# Patient Record
Sex: Female | Born: 1935 | Race: White | Hispanic: No | Smoking: Never smoker
Health system: Southern US, Community
[De-identification: ages and names within clinical notes are randomized; demographics above are authoritative.]

## PROBLEM LIST (undated history)

## (undated) DIAGNOSIS — C50919 Malignant neoplasm of unspecified site of unspecified female breast: Secondary | ICD-10-CM

## (undated) DIAGNOSIS — Z9221 Personal history of antineoplastic chemotherapy: Secondary | ICD-10-CM

## (undated) DIAGNOSIS — K219 Gastro-esophageal reflux disease without esophagitis: Secondary | ICD-10-CM

## (undated) DIAGNOSIS — M1990 Unspecified osteoarthritis, unspecified site: Secondary | ICD-10-CM

## (undated) DIAGNOSIS — E785 Hyperlipidemia, unspecified: Secondary | ICD-10-CM

## (undated) DIAGNOSIS — I10 Essential (primary) hypertension: Secondary | ICD-10-CM

## (undated) DIAGNOSIS — Z9223 Personal history of estrogen therapy: Secondary | ICD-10-CM

## (undated) DIAGNOSIS — U071 COVID-19: Secondary | ICD-10-CM

## (undated) DIAGNOSIS — Z923 Personal history of irradiation: Secondary | ICD-10-CM

## (undated) DIAGNOSIS — B029 Zoster without complications: Secondary | ICD-10-CM

## (undated) DIAGNOSIS — N368 Other specified disorders of urethra: Secondary | ICD-10-CM

## (undated) DIAGNOSIS — C801 Malignant (primary) neoplasm, unspecified: Secondary | ICD-10-CM

## (undated) DIAGNOSIS — M419 Scoliosis, unspecified: Secondary | ICD-10-CM

## (undated) HISTORY — PX: BREAST BIOPSY: SHX20

## (undated) HISTORY — DX: Other specified disorders of urethra: N36.8

## (undated) HISTORY — PX: TONSILECTOMY, ADENOIDECTOMY, BILATERAL MYRINGOTOMY AND TUBES: SHX2538

## (undated) HISTORY — DX: Essential (primary) hypertension: I10

## (undated) HISTORY — DX: Gastro-esophageal reflux disease without esophagitis: K21.9

## (undated) HISTORY — DX: Hyperlipidemia, unspecified: E78.5

## (undated) HISTORY — DX: Malignant neoplasm of unspecified site of unspecified female breast: C50.919

## (undated) HISTORY — DX: Zoster without complications: B02.9

## (undated) HISTORY — DX: Scoliosis, unspecified: M41.9

## (undated) HISTORY — DX: Malignant (primary) neoplasm, unspecified: C80.1

## (undated) HISTORY — DX: Personal history of estrogen therapy: Z92.23

---
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
## 1998-05-15 ENCOUNTER — Other Ambulatory Visit: Admission: RE | Admit: 1998-05-15 | Discharge: 1998-05-15 | Payer: Self-pay | Admitting: Obstetrics and Gynecology

## 1999-08-12 ENCOUNTER — Ambulatory Visit (HOSPITAL_COMMUNITY): Admission: RE | Admit: 1999-08-12 | Discharge: 1999-08-12 | Payer: Self-pay | Admitting: Gastroenterology

## 1999-08-12 ENCOUNTER — Encounter: Payer: Self-pay | Admitting: Gastroenterology

## 2000-09-14 ENCOUNTER — Encounter (INDEPENDENT_AMBULATORY_CARE_PROVIDER_SITE_OTHER): Payer: Self-pay | Admitting: Specialist

## 2000-09-14 ENCOUNTER — Other Ambulatory Visit: Admission: RE | Admit: 2000-09-14 | Discharge: 2000-09-14 | Payer: Self-pay | Admitting: Obstetrics and Gynecology

## 2001-12-12 DIAGNOSIS — C801 Malignant (primary) neoplasm, unspecified: Secondary | ICD-10-CM

## 2001-12-12 HISTORY — DX: Malignant (primary) neoplasm, unspecified: C80.1

## 2001-12-12 HISTORY — PX: BREAST LUMPECTOMY: SHX2

## 2002-06-12 ENCOUNTER — Encounter: Payer: Self-pay | Admitting: Obstetrics and Gynecology

## 2002-06-12 ENCOUNTER — Other Ambulatory Visit: Admission: RE | Admit: 2002-06-12 | Discharge: 2002-06-12 | Payer: Self-pay | Admitting: *Deleted

## 2002-06-12 ENCOUNTER — Encounter: Admission: RE | Admit: 2002-06-12 | Discharge: 2002-06-12 | Payer: Self-pay | Admitting: Obstetrics and Gynecology

## 2002-06-12 ENCOUNTER — Encounter (INDEPENDENT_AMBULATORY_CARE_PROVIDER_SITE_OTHER): Payer: Self-pay | Admitting: *Deleted

## 2002-06-19 ENCOUNTER — Encounter: Payer: Self-pay | Admitting: *Deleted

## 2002-06-19 ENCOUNTER — Encounter: Admission: RE | Admit: 2002-06-19 | Discharge: 2002-06-19 | Payer: Self-pay | Admitting: *Deleted

## 2002-06-21 ENCOUNTER — Encounter: Admission: RE | Admit: 2002-06-21 | Discharge: 2002-06-21 | Payer: Self-pay | Admitting: *Deleted

## 2002-06-21 ENCOUNTER — Ambulatory Visit (HOSPITAL_BASED_OUTPATIENT_CLINIC_OR_DEPARTMENT_OTHER): Admission: RE | Admit: 2002-06-21 | Discharge: 2002-06-21 | Payer: Self-pay | Admitting: *Deleted

## 2002-06-21 ENCOUNTER — Encounter (INDEPENDENT_AMBULATORY_CARE_PROVIDER_SITE_OTHER): Payer: Self-pay | Admitting: Specialist

## 2002-06-21 ENCOUNTER — Encounter: Payer: Self-pay | Admitting: *Deleted

## 2002-07-03 ENCOUNTER — Ambulatory Visit: Admission: RE | Admit: 2002-07-03 | Discharge: 2002-10-01 | Payer: Self-pay | Admitting: Radiation Oncology

## 2002-07-12 HISTORY — PX: BREAST SURGERY: SHX581

## 2002-07-19 ENCOUNTER — Ambulatory Visit (HOSPITAL_BASED_OUTPATIENT_CLINIC_OR_DEPARTMENT_OTHER): Admission: RE | Admit: 2002-07-19 | Discharge: 2002-07-19 | Payer: Self-pay | Admitting: *Deleted

## 2002-07-19 ENCOUNTER — Encounter: Payer: Self-pay | Admitting: *Deleted

## 2002-10-31 ENCOUNTER — Ambulatory Visit: Admission: RE | Admit: 2002-10-31 | Discharge: 2003-01-29 | Payer: Self-pay | Admitting: Radiation Oncology

## 2002-11-19 ENCOUNTER — Encounter: Admission: RE | Admit: 2002-11-19 | Discharge: 2002-11-19 | Payer: Self-pay | Admitting: Radiation Oncology

## 2002-12-25 ENCOUNTER — Ambulatory Visit (HOSPITAL_BASED_OUTPATIENT_CLINIC_OR_DEPARTMENT_OTHER): Admission: RE | Admit: 2002-12-25 | Discharge: 2002-12-25 | Payer: Self-pay | Admitting: *Deleted

## 2003-08-06 ENCOUNTER — Encounter: Admission: RE | Admit: 2003-08-06 | Discharge: 2003-08-06 | Payer: Self-pay | Admitting: *Deleted

## 2003-08-06 ENCOUNTER — Encounter: Payer: Self-pay | Admitting: *Deleted

## 2003-08-25 ENCOUNTER — Ambulatory Visit (HOSPITAL_COMMUNITY): Admission: RE | Admit: 2003-08-25 | Discharge: 2003-08-25 | Payer: Self-pay | Admitting: Oncology

## 2003-09-25 ENCOUNTER — Ambulatory Visit: Admission: RE | Admit: 2003-09-25 | Discharge: 2003-09-25 | Payer: Self-pay | Admitting: Radiation Oncology

## 2004-03-18 ENCOUNTER — Ambulatory Visit (HOSPITAL_COMMUNITY): Admission: RE | Admit: 2004-03-18 | Discharge: 2004-03-18 | Payer: Self-pay | Admitting: Oncology

## 2004-05-10 ENCOUNTER — Encounter (HOSPITAL_COMMUNITY): Admission: RE | Admit: 2004-05-10 | Discharge: 2004-05-10 | Payer: Self-pay | Admitting: Oncology

## 2004-08-11 ENCOUNTER — Encounter: Admission: RE | Admit: 2004-08-11 | Discharge: 2004-08-11 | Payer: Self-pay | Admitting: Oncology

## 2004-09-13 ENCOUNTER — Ambulatory Visit (HOSPITAL_COMMUNITY): Admission: RE | Admit: 2004-09-13 | Discharge: 2004-09-13 | Payer: Self-pay | Admitting: Gastroenterology

## 2004-11-11 ENCOUNTER — Ambulatory Visit: Payer: Self-pay | Admitting: Oncology

## 2005-02-04 ENCOUNTER — Encounter: Payer: Self-pay | Admitting: Cardiology

## 2005-05-12 ENCOUNTER — Ambulatory Visit: Payer: Self-pay | Admitting: Oncology

## 2005-08-17 ENCOUNTER — Encounter: Admission: RE | Admit: 2005-08-17 | Discharge: 2005-08-17 | Payer: Self-pay | Admitting: Obstetrics and Gynecology

## 2005-11-09 ENCOUNTER — Ambulatory Visit: Payer: Self-pay | Admitting: Oncology

## 2006-05-26 ENCOUNTER — Ambulatory Visit: Payer: Self-pay | Admitting: Oncology

## 2006-05-29 LAB — CBC WITH DIFFERENTIAL (CANCER CENTER ONLY)
BASO%: 1.1 % (ref 0.0–2.0)
EOS%: 2.8 % (ref 0.0–7.0)
LYMPH%: 26.6 % (ref 14.0–48.0)
MCH: 29.1 pg (ref 26.0–34.0)
MCHC: 32.6 g/dL (ref 32.0–36.0)
MCV: 89 fL (ref 81–101)
MONO#: 0.2 10*3/uL (ref 0.1–0.9)
MONO%: 4.1 % (ref 0.0–13.0)
Platelets: 160 10*3/uL (ref 145–400)
RDW: 12.3 % (ref 10.5–14.6)
WBC: 5.1 10*3/uL (ref 3.9–10.0)

## 2006-05-29 LAB — CANCER ANTIGEN 27.29: CA 27.29: 25 U/mL (ref 0–39)

## 2006-05-29 LAB — COMPREHENSIVE METABOLIC PANEL
Albumin: 4.7 g/dL (ref 3.5–5.2)
Alkaline Phosphatase: 90 U/L (ref 39–117)
BUN: 17 mg/dL (ref 6–23)
Glucose, Bld: 115 mg/dL — ABNORMAL HIGH (ref 70–99)
Total Bilirubin: 0.6 mg/dL (ref 0.3–1.2)

## 2006-08-18 ENCOUNTER — Encounter: Admission: RE | Admit: 2006-08-18 | Discharge: 2006-08-18 | Payer: Self-pay | Admitting: Oncology

## 2006-11-22 ENCOUNTER — Ambulatory Visit: Payer: Self-pay | Admitting: Oncology

## 2006-11-27 LAB — COMPREHENSIVE METABOLIC PANEL
ALT: 19 U/L (ref 0–35)
AST: 21 U/L (ref 0–37)
Albumin: 4.6 g/dL (ref 3.5–5.2)
Calcium: 10.2 mg/dL (ref 8.4–10.5)
Chloride: 104 mEq/L (ref 96–112)
Potassium: 4.1 mEq/L (ref 3.5–5.3)
Sodium: 138 mEq/L (ref 135–145)

## 2006-11-27 LAB — CBC WITH DIFFERENTIAL (CANCER CENTER ONLY)
Eosinophils Absolute: 0.1 10*3/uL (ref 0.0–0.5)
LYMPH%: 30.8 % (ref 14.0–48.0)
MCH: 30.4 pg (ref 26.0–34.0)
MCV: 90 fL (ref 81–101)
MONO%: 6 % (ref 0.0–13.0)
Platelets: 149 10*3/uL (ref 145–400)
RBC: 4.98 10*6/uL (ref 3.70–5.32)

## 2007-01-17 ENCOUNTER — Encounter: Admission: RE | Admit: 2007-01-17 | Discharge: 2007-01-17 | Payer: Self-pay | Admitting: Internal Medicine

## 2007-05-25 ENCOUNTER — Ambulatory Visit: Payer: Self-pay | Admitting: Oncology

## 2007-05-28 LAB — CBC WITH DIFFERENTIAL (CANCER CENTER ONLY)
BASO%: 0.5 % (ref 0.0–2.0)
EOS%: 3.1 % (ref 0.0–7.0)
LYMPH#: 1.4 10*3/uL (ref 0.9–3.3)
MONO#: 0.3 10*3/uL (ref 0.1–0.9)
NEUT#: 3 10*3/uL (ref 1.5–6.5)
Platelets: 207 10*3/uL (ref 145–400)
RDW: 12.3 % (ref 10.5–14.6)
WBC: 4.9 10*3/uL (ref 3.9–10.0)

## 2007-05-28 LAB — COMPREHENSIVE METABOLIC PANEL
ALT: 16 U/L (ref 0–35)
AST: 24 U/L (ref 0–37)
CO2: 23 mEq/L (ref 19–32)
Chloride: 104 mEq/L (ref 96–112)
Sodium: 138 mEq/L (ref 135–145)
Total Bilirubin: 0.5 mg/dL (ref 0.3–1.2)
Total Protein: 6.9 g/dL (ref 6.0–8.3)

## 2007-05-28 LAB — CANCER ANTIGEN 27.29: CA 27.29: 15 U/mL (ref 0–39)

## 2007-08-21 ENCOUNTER — Encounter: Admission: RE | Admit: 2007-08-21 | Discharge: 2007-08-21 | Payer: Self-pay | Admitting: Surgery

## 2007-09-03 ENCOUNTER — Encounter: Admission: RE | Admit: 2007-09-03 | Discharge: 2007-09-03 | Payer: Self-pay | Admitting: Internal Medicine

## 2007-11-20 ENCOUNTER — Ambulatory Visit: Payer: Self-pay | Admitting: Oncology

## 2007-11-21 LAB — CBC WITH DIFFERENTIAL (CANCER CENTER ONLY)
BASO#: 0 10*3/uL (ref 0.0–0.2)
BASO%: 0.4 % (ref 0.0–2.0)
HCT: 44.2 % (ref 34.8–46.6)
HGB: 14.9 g/dL (ref 11.6–15.9)
LYMPH#: 0.8 10*3/uL — ABNORMAL LOW (ref 0.9–3.3)
MONO#: 0.3 10*3/uL (ref 0.1–0.9)
NEUT#: 3.7 10*3/uL (ref 1.5–6.5)
NEUT%: 75.7 % (ref 39.6–80.0)
WBC: 4.9 10*3/uL (ref 3.9–10.0)

## 2007-11-21 LAB — COMPREHENSIVE METABOLIC PANEL
ALT: 13 U/L (ref 0–35)
BUN: 13 mg/dL (ref 6–23)
CO2: 26 mEq/L (ref 19–32)
Creatinine, Ser: 0.81 mg/dL (ref 0.40–1.20)
Total Bilirubin: 0.6 mg/dL (ref 0.3–1.2)

## 2007-11-21 LAB — CANCER ANTIGEN 27.29: CA 27.29: 20 U/mL (ref 0–39)

## 2007-11-23 ENCOUNTER — Emergency Department (HOSPITAL_COMMUNITY): Admission: EM | Admit: 2007-11-23 | Discharge: 2007-11-23 | Payer: Self-pay | Admitting: Family Medicine

## 2008-05-19 ENCOUNTER — Ambulatory Visit: Payer: Self-pay | Admitting: Oncology

## 2008-05-21 LAB — CBC WITH DIFFERENTIAL (CANCER CENTER ONLY)
BASO#: 0 10*3/uL (ref 0.0–0.2)
Eosinophils Absolute: 0.2 10*3/uL (ref 0.0–0.5)
HCT: 43.4 % (ref 34.8–46.6)
HGB: 15 g/dL (ref 11.6–15.9)
LYMPH%: 23.3 % (ref 14.0–48.0)
MCH: 29.9 pg (ref 26.0–34.0)
MCV: 87 fL (ref 81–101)
MONO%: 4.9 % (ref 0.0–13.0)
RBC: 5.01 10*6/uL (ref 3.70–5.32)

## 2008-05-21 LAB — COMPREHENSIVE METABOLIC PANEL
ALT: 15 U/L (ref 0–35)
AST: 19 U/L (ref 0–37)
Albumin: 4.6 g/dL (ref 3.5–5.2)
Alkaline Phosphatase: 68 U/L (ref 39–117)
Potassium: 3.9 mEq/L (ref 3.5–5.3)
Sodium: 141 mEq/L (ref 135–145)
Total Protein: 7.2 g/dL (ref 6.0–8.3)

## 2008-05-21 LAB — CANCER ANTIGEN 27.29: CA 27.29: 22 U/mL (ref 0–39)

## 2008-08-21 ENCOUNTER — Encounter: Admission: RE | Admit: 2008-08-21 | Discharge: 2008-08-21 | Payer: Self-pay | Admitting: Oncology

## 2008-11-13 ENCOUNTER — Ambulatory Visit: Payer: Self-pay | Admitting: Oncology

## 2008-11-17 LAB — CMP (CANCER CENTER ONLY)
Albumin: 4 g/dL (ref 3.3–5.5)
BUN, Bld: 15 mg/dL (ref 7–22)
Calcium: 9.7 mg/dL (ref 8.0–10.3)
Chloride: 105 mEq/L (ref 98–108)
Creat: 0.9 mg/dl (ref 0.6–1.2)
Glucose, Bld: 90 mg/dL (ref 73–118)
Potassium: 4.4 mEq/L (ref 3.3–4.7)

## 2008-11-17 LAB — CBC WITH DIFFERENTIAL (CANCER CENTER ONLY)
BASO#: 0.1 10*3/uL (ref 0.0–0.2)
Eosinophils Absolute: 0.2 10*3/uL (ref 0.0–0.5)
HGB: 14.8 g/dL (ref 11.6–15.9)
LYMPH#: 1.3 10*3/uL (ref 0.9–3.3)
NEUT#: 3.3 10*3/uL (ref 1.5–6.5)
RBC: 5.13 10*6/uL (ref 3.70–5.32)

## 2008-11-17 LAB — CANCER ANTIGEN 27.29: CA 27.29: 24 U/mL (ref 0–39)

## 2009-01-22 ENCOUNTER — Ambulatory Visit: Payer: Self-pay | Admitting: Oncology

## 2009-01-26 LAB — CMP (CANCER CENTER ONLY)
ALT(SGPT): 20 U/L (ref 10–47)
AST: 27 U/L (ref 11–38)
Alkaline Phosphatase: 56 U/L (ref 26–84)
BUN, Bld: 13 mg/dL (ref 7–22)
Creat: 1.1 mg/dl (ref 0.6–1.2)
Potassium: 4.2 mEq/L (ref 3.3–4.7)

## 2009-01-26 LAB — CBC WITH DIFFERENTIAL (CANCER CENTER ONLY)
BASO#: 0.1 10*3/uL (ref 0.0–0.2)
BASO%: 1.1 % (ref 0.0–2.0)
EOS%: 2.9 % (ref 0.0–7.0)
HGB: 15.1 g/dL (ref 11.6–15.9)
LYMPH#: 1.4 10*3/uL (ref 0.9–3.3)
MCHC: 33.9 g/dL (ref 32.0–36.0)
MONO#: 0.3 10*3/uL (ref 0.1–0.9)
NEUT#: 2.6 10*3/uL (ref 1.5–6.5)
RDW: 11.7 % (ref 10.5–14.6)
WBC: 4.5 10*3/uL (ref 3.9–10.0)

## 2009-08-24 ENCOUNTER — Encounter: Admission: RE | Admit: 2009-08-24 | Discharge: 2009-08-24 | Payer: Self-pay | Admitting: Obstetrics and Gynecology

## 2010-01-29 ENCOUNTER — Ambulatory Visit: Payer: Self-pay | Admitting: Oncology

## 2010-02-02 LAB — CMP (CANCER CENTER ONLY)
Albumin: 3.9 g/dL (ref 3.3–5.5)
BUN, Bld: 12 mg/dL (ref 7–22)
CO2: 31 mEq/L (ref 18–33)
Calcium: 10 mg/dL (ref 8.0–10.3)
Chloride: 99 mEq/L (ref 98–108)
Creat: 0.7 mg/dl (ref 0.6–1.2)
Glucose, Bld: 94 mg/dL (ref 73–118)
Potassium: 4 mEq/L (ref 3.3–4.7)

## 2010-02-02 LAB — CBC WITH DIFFERENTIAL (CANCER CENTER ONLY)
BASO#: 0 10*3/uL (ref 0.0–0.2)
EOS%: 1.8 % (ref 0.0–7.0)
Eosinophils Absolute: 0.1 10*3/uL (ref 0.0–0.5)
HCT: 44.3 % (ref 34.8–46.6)
HGB: 14.8 g/dL (ref 11.6–15.9)
LYMPH#: 1.2 10*3/uL (ref 0.9–3.3)
MCHC: 33.3 g/dL (ref 32.0–36.0)
NEUT#: 3.4 10*3/uL (ref 1.5–6.5)
NEUT%: 67 % (ref 39.6–80.0)
RBC: 4.83 10*6/uL (ref 3.70–5.32)

## 2010-02-02 LAB — CANCER ANTIGEN 27.29: CA 27.29: 22 U/mL (ref 0–39)

## 2010-02-17 ENCOUNTER — Encounter (HOSPITAL_COMMUNITY): Admission: RE | Admit: 2010-02-17 | Discharge: 2010-05-18 | Payer: Self-pay | Admitting: Oncology

## 2010-08-25 ENCOUNTER — Encounter: Admission: RE | Admit: 2010-08-25 | Discharge: 2010-08-25 | Payer: Self-pay | Admitting: Obstetrics and Gynecology

## 2010-10-05 ENCOUNTER — Encounter: Admission: RE | Admit: 2010-10-05 | Discharge: 2010-10-05 | Payer: Self-pay | Admitting: Internal Medicine

## 2010-10-20 ENCOUNTER — Encounter: Admission: RE | Admit: 2010-10-20 | Discharge: 2010-10-20 | Payer: Self-pay | Admitting: Internal Medicine

## 2011-01-02 ENCOUNTER — Encounter: Payer: Self-pay | Admitting: Internal Medicine

## 2011-02-02 ENCOUNTER — Other Ambulatory Visit: Payer: Self-pay | Admitting: Oncology

## 2011-02-02 ENCOUNTER — Encounter (HOSPITAL_BASED_OUTPATIENT_CLINIC_OR_DEPARTMENT_OTHER): Payer: Medicare Other | Admitting: Oncology

## 2011-02-02 DIAGNOSIS — Z170 Estrogen receptor positive status [ER+]: Secondary | ICD-10-CM

## 2011-02-02 DIAGNOSIS — R079 Chest pain, unspecified: Secondary | ICD-10-CM

## 2011-02-02 DIAGNOSIS — C50419 Malignant neoplasm of upper-outer quadrant of unspecified female breast: Secondary | ICD-10-CM

## 2011-02-02 LAB — COMPREHENSIVE METABOLIC PANEL WITH GFR
ALT: 12 U/L (ref 0–35)
AST: 18 U/L (ref 0–37)
Albumin: 4.5 g/dL (ref 3.5–5.2)
Alkaline Phosphatase: 48 U/L (ref 39–117)
BUN: 15 mg/dL (ref 6–23)
CO2: 23 meq/L (ref 19–32)
Calcium: 10.1 mg/dL (ref 8.4–10.5)
Chloride: 104 meq/L (ref 96–112)
Creatinine, Ser: 0.69 mg/dL (ref 0.40–1.20)
Glucose, Bld: 92 mg/dL (ref 70–99)
Potassium: 3.8 meq/L (ref 3.5–5.3)
Sodium: 139 meq/L (ref 135–145)
Total Bilirubin: 0.6 mg/dL (ref 0.3–1.2)
Total Protein: 6.6 g/dL (ref 6.0–8.3)

## 2011-02-02 LAB — CBC WITH DIFFERENTIAL/PLATELET
Basophils Absolute: 0 10*3/uL (ref 0.0–0.1)
Eosinophils Absolute: 0.1 10*3/uL (ref 0.0–0.5)
HGB: 14 g/dL (ref 11.6–15.9)
MCV: 90.6 fL (ref 79.5–101.0)
MONO%: 9.2 % (ref 0.0–14.0)
NEUT#: 3.7 10*3/uL (ref 1.5–6.5)
RBC: 4.62 10*6/uL (ref 3.70–5.45)
RDW: 13.3 % (ref 11.2–14.5)
WBC: 6 10*3/uL (ref 3.9–10.3)
lymph#: 1.6 10*3/uL (ref 0.9–3.3)

## 2011-03-28 ENCOUNTER — Other Ambulatory Visit: Payer: Self-pay | Admitting: Internal Medicine

## 2011-03-28 DIAGNOSIS — M549 Dorsalgia, unspecified: Secondary | ICD-10-CM

## 2011-03-29 ENCOUNTER — Ambulatory Visit
Admission: RE | Admit: 2011-03-29 | Discharge: 2011-03-29 | Disposition: A | Discharge status: Home / Self Care | Payer: Medicare Other | Source: Ambulatory Visit | Attending: Internal Medicine | Admitting: Internal Medicine

## 2011-03-29 DIAGNOSIS — M549 Dorsalgia, unspecified: Secondary | ICD-10-CM

## 2011-04-29 NOTE — Op Note (Signed)
Marie Alvarez, Marie Alvarez                   ACCOUNT NO.:  [id number]   MEDICAL RECORD NO.:  [id number]          PATIENT TYPE:  AMB   LOCATION:  ENDO                         FACILITY:  [facility]   PHYSICIAN:  Bernette Redbird, M.D.   DATE OF BIRTH:  1931-03-27   DATE OF PROCEDURE:  09/13/2004  DATE OF DISCHARGE:                                 OPERATIVE REPORT   PROCEDURE:  Colonoscopy.   INDICATION:  Colon cancer screening in a patient without worrisome risk  factors or symptoms, age 77, with a prior colon exam (flexible sigmoidoscopy  versus colonoscopy, details not clear), apparently showing a poor prep.   FINDINGS:  Extensive diverticulosis with sigmoid fixation.  Otherwise normal  to the terminal ileum.   DESCRIPTION OF PROCEDURE:  The nature, purpose, and risks of the procedure  had been discussed with the patient who provided written consent.  Sedation  was fentanyl 100 mcg and Versed 10 mg IV without arrhythmias or  desaturation.   The Olympus adjustable-tension pediatric videocolonoscope was advanced with  moderate difficulty through a sigmoid region that had very large amount of  diverticular change with associated muscular thickening, angulation, and  stiffening of the colon.  By having the patient in the supine position and  using tip deflection and some slide-by maneuvering, I was able to eventually  get through this area, whereupon the scope advanced fairly easily with the  help of some external abdominal compression to the cecum and from there into  a normal-appearing terminal ileum, whereupon pullback was performed.  The  quality of the prep was quite good, and it was felt that essentially all  areas were adequately seen.  Other than the fairly severe left-side sigmoid  diverticular change, this was a normal examination.  No polyps, cancer,  colitis, or vascular malformations were observed.  Retroflexion in the  rectum was performed and on inspection was normal.  No biopsies  were  obtained.  The patient tolerated the procedure well, and there were no  apparent complications.   IMPRESSION:  Severe sigmoid diverticular change, otherwise unremarkable  screening examination in a standard-risk individual (V76.51).   PLAN:  Consider flexible sigmoidoscopy in five years for continued  screening, recognizing that it would probably be a rather limited exam in  view of the patient's anatomy.       RB/MEDQ  D:  09/13/2004  T:  09/13/2004  Job:  [id number]   cc:   Loraine Leriche A. Waynard Edwards, M.D.  [address]  [place]  [place] [address]  Fax: [phone]   [name], MD  Fax: [phone]

## 2011-04-29 NOTE — Op Note (Signed)
[place]  Patient:    Marie Alvarez, Marie Alvarez Visit Number: [id number] MRN: [id number]          Service Type: DSU Location: [facility] Attending Physician:  Kandis Mannan Dictated by:   Donnie Coffin Samuella Cota, M.D. Proc. Date: 06/21/02 Admit Date:  06/21/2002 Discharge Date: 06/21/2002   CC:         Katherine Roan, M.D.  Lonzo Cloud. Kriste Basque, M.D. [facility]   Operative Report  PREOPERATIVE DIAGNOSIS: 1. Lobular carcinoma of left breast. 2. Skin lesion, right leg.  POSTOPERATIVE DIAGNOSIS: 1. Lobular carcinoma of the left breast. 2. Skin lesion, right leg.  OPERATION PERFORMED: 1. Injection of blue dye left breast, sentinel lymph node dissection, left    axilla.  Needle localization and a partial mastectomy of the left breast. 2. Incisional biopsy, right leg.  SURGEON:  Maisie Fus B. Samuella Cota, M.D.  ANESTHESIA:  General, anesthesiologist and CRNA [place].  DESCRIPTION OF PROCEDURE:  The patient was taken to the operating room and placed on the table in supine position and after satisfactory general anesthetic with LMA intubation, the left breast and axilla were prepped and draped as a sterile field.  The patient had previously been injected with about 4 cc of isosulfan blue dye in the subareolar area.  The area was massaged for about three minutes.  After sterile drapes had been applied, the Neoprobe was placed over the left axilla and a hot spot was marked.  A transverse incision was made in the lower axilla through skin and subcutaneous tissues.  A blue lymphatic was readily identified going to a small lymph node which was also blue and hot.  This was dissected free with bleeding being controlled with the cautery.  The lymph node was then placed on the Neoprobe and it was quite hot.  The probe was placed back into axilla.  There were no other hot nodes noted.  This wound was then packed open.  The specimen was sent to the pathologist.  The patient had two wires  coming through the skin at about the 5 oclock position aimed cephalad.  An elliptical incision was made so as to remove the opening in the skin made by the wire and this was actually at the lower inferior portion and the curved incision extended on up into the left upper quadrant.  The incision was probably 6 to 7 cm in length.  The tumor was thought to be quite large, so a large amount of breast tissue was taken.  Skin flaps were developed medially and laterally, superiorly and inferiorly and then the breast was taken down to the chest wall and removed from the chest wall.  The specimen was then marked with a short suture at the inferior medial skin margin, a long suture at the superior and lateral margin and a short long suture marked the depth of the specimen.  This was sent to the pathologist fresh.  It was first sent to the radiologist who x-rayed the specimen, so the area of concern had been removed.  Bleeding was controlled with the cautery or with ties of 3-0 Vicryl.  The pathologist, Dr. Delila Spence reported the sentinel lymph node was negative on imprint.  No evidence of malignancy in the node.  Both wounds were copiously irrigated and closed with subcuticular sutures of 3-0 Vicryl followed by a running 4-0 subcuticular Vicryl.  Benzoin and half inch Steri-Strips were used to reinforce the skin closure.  Pressure dressing using 4 x 4s, ABD  and four inch HypaFix was applied.  Attention was then directed to the right leg where laterally, she had about a 1.5 cm lesion which is not healed.  A small ellipse of the skin and underlying subcutaneous tissue was excised for biopsy purposes.  The wound was closed with interrupted sutures of 4-0 nylon.  Dry sterile dressing was applied.  The pathologist also reported that the margins on the resected left breast tissue was negative for tumor.  The patient seemed to tolerate all the procedures well and was taken to the PACU in satisfactory  condition. Dictated by:   Donnie Coffin Samuella Cota, M.D. Attending Physician:  Kandis Mannan DD:  06/21/02 TD:  06/24/02 Job: [id number]

## 2011-04-29 NOTE — Op Note (Signed)
Marie Alvarez, Marie Alvarez                               ACCOUNT NO.:  [id number]   MEDICAL RECORD NO.:  [id number]                   PATIENT TYPE:   LOCATION:                                       FACILITY:   PHYSICIAN:  Maisie Fus B. Samuella Cota, M.D.               DATE OF BIRTH:   DATE OF PROCEDURE:  07/19/2002  DATE OF DISCHARGE:                                 OPERATIVE REPORT   PREOPERATIVE DIAGNOSIS:  Carcinoma of the left breast, needs Port-A-Cath for  intravenous chemotherapy.   POSTOPERATIVE DIAGNOSIS:  Carcinoma of the left breast, needs Port-A-Cath  for intravenous chemotherapy.   OPERATION:  Insertion of Port-A-Cath (Bard ISP) catheter via right  subclavian vein.   SURGEON:  Maisie Fus B. Samuella Cota, M.D.   ASSISTANT:  Kaylyn Layer. Michelle Piper, M.D.   ANESTHESIA:  Local 1% Xylocaine.   ANESTHESIOLOGIST:  Kaylyn Layer. Michelle Piper, M.D. and C.R.N.A.   PROCEDURE:  The patient was taken to the operating room and placed on the  table in the supine position.  A rolled up sheet was placed vertically along  the upper thoracic spines.  The neck and chest area were prepped  bilaterally.  The patient was placed in the Trendelenburg position.  Sterile  drapes were applied.  Xylocaine, 1%, local was used to infiltrate the skin  beneath the right clavicle.  With some difficulty, the right subclavian vein  was entered but the guide wire would not pass.  Twice I entered the vein but  the guide wire would not pass.  I asked Dr. Arta Bruce, our  anesthesiologist, to assist me and he was able to get into the right  subclavian vein and we were able to pass the guide wire caudally.  Xylocaine, 1%, local was used to infiltrate the skin overlying the right  chest where a small transverse incision was made and a subcutaneous pocket  made inferiorly to that.  The Bard ISP catheter was then tunneled from the  area of the subcutaneous pocket through the area of the subclavian stick.  The catheter was filled with heparinized saline.   The dilator was placed  over the guide wire and then the dilator and peel-away sheath was placed  over the guide wire and the guide wire and dilator were removed leaving the  peal-away sheath.  The Bard catheter was then placed through the peel-away  sheath and passed caudally.  The peel-away sheath was pulled away.  The tip  of the catheter, by fluoroscopy was brought up into the area of the superior  vena cava.  The catheter was then connected to the reservoir which had  previously been filled with heparinized saline.  This was done using the  round attaching device.  The reservoir was sutured into the subcutaneous  pocket using two 3-0 Vicryl sutures.  Fluoroscopy was used to check the  position and everything seemed in good  position.  I then was able to  aspirate through the reservoir and then fill the entire system with 4 cc of  heparin, 100 units/cc.  Subcutaneous 3-0 Vicryl sutures were placed and then  the skin was closed with a running subcuticular suture of 4-0 Vicryl with a  single 4-0  Vicryl subcuticular suture at the subclavian stick.  Benzoin and half-inch  Steri-Strips were used to reinforce the skin closure.  A dry sterile  dressing was applied.  The patient seemed to tolerated the procedure well  and was taken to the PACU in satisfactory condition.                                               Thomas B. Samuella Cota, M.D.    TBP/MEDQ  D:  07/19/2002  T:  07/23/2002  Job:  [id number]   cc:   Donetta Potts, M.D.   Faythe Casa, MD  [address] [place] [address]   Artist Pais Kathrynn Running, M.D.   Lonzo Cloud. Kriste Basque, M.D. [facility]   Maisie Fus B. Samuella Cota, M.D.  Fax: [phone]

## 2011-07-20 ENCOUNTER — Other Ambulatory Visit: Payer: Self-pay | Admitting: Internal Medicine

## 2011-07-20 DIAGNOSIS — Z1231 Encounter for screening mammogram for malignant neoplasm of breast: Secondary | ICD-10-CM

## 2011-08-29 ENCOUNTER — Ambulatory Visit
Admission: RE | Admit: 2011-08-29 | Discharge: 2011-08-29 | Disposition: A | Discharge status: Home / Self Care | Payer: Medicare Other | Source: Ambulatory Visit | Attending: Internal Medicine | Admitting: Internal Medicine

## 2011-08-29 DIAGNOSIS — Z1231 Encounter for screening mammogram for malignant neoplasm of breast: Secondary | ICD-10-CM

## 2011-10-10 ENCOUNTER — Ambulatory Visit: Payer: Medicare Other | Admitting: Gynecology

## 2011-12-26 ENCOUNTER — Ambulatory Visit (INDEPENDENT_AMBULATORY_CARE_PROVIDER_SITE_OTHER): Payer: Medicare Other | Admitting: Gynecology

## 2011-12-26 ENCOUNTER — Encounter: Payer: Self-pay | Admitting: Gynecology

## 2011-12-26 VITALS — BP 140/80 | Ht 63.0 in | Wt 146.0 lb

## 2011-12-26 DIAGNOSIS — M810 Age-related osteoporosis without current pathological fracture: Secondary | ICD-10-CM | POA: Diagnosis not present

## 2011-12-26 DIAGNOSIS — N952 Postmenopausal atrophic vaginitis: Secondary | ICD-10-CM

## 2011-12-26 NOTE — Progress Notes (Signed)
Marie Alvarez 01/12/1931 [id number]        78 y.o. new patient for follow up. Normally saw Dr. Leota Alvarez.  Past medical history,surgical history, medications, allergies, family history and social history were all reviewed and documented in the EPIC chart. ROS:  Was performed and pertinent positives and negatives are included in the history.  Exam: Marie Alvarez chaperone present Filed Vitals:   12/26/11 1002  BP: 140/80   General appearance  Normal Skin grossly normal Head/Neck normal with no cervical or supraclavicular adenopathy thyroid normal Lungs  clear Cardiac RR, without RMG Abdominal  soft, nontender, without masses, organomegaly or hernia Breasts  examined lying and sitting. Right without masses, retractions, discharge or axillary adenopathy.  Left with scarring from her lumpectomy.  No acute changes, masses, discharge or adenopathy Pelvic  Ext/BUS/vagina  Atrophic genital changes  Cervix  atrophic  Uterus  small, normal shape and contour, midline and mobile nontender   Adnexa  Without masses or tenderness    Anus and perineum  normal   Rectovaginal  normal sphincter tone without palpated masses or tenderness.    Assessment/Plan:  78 y.o. female for annual exam.    1. Atrophic changes. She has not sexually active and this is not an issue. 2. Breast cancer. Patient has history of lumpectomy with radiation and chemotherapy 2003.  Remains with no evidence of disease. She'll continue to follow with her oncologist that she sees annually by history.  She is up-to-date with mammography. SBE monthly reviewed. 3. Bone health. Patient is on alendronate historically through Dr. Laurey Alvarez office. I do not have a copy of her bone density and have asked her make sure she follows up with him in reference to this in management of her bones. Increase calcium vitamin D discussed. 4. Pap smear. She has no history of abnormal Pap smears before. She has numerous normal reports in her chart, the last in  2011. No Pap smear was done today. I discussed current screening guidelines the options to stop altogether if she is over 56 was discussed and we'll readdress this on an annual basis. 5. Colonoscopy. Patients at 10 years from her last colonoscopy and received a letter recommending she scheduled this. Dr. Waynard Alvarez told her that she did not need to do this. I've asked her to rediscuss this with Dr. Waynard Alvarez. She is active and otherwise in general good health and I also suggested that she discuss this with the gastroenterologist who did her last colonoscopy. 6. Health maintenance. No blood work was done today this is all done through Dr. Laurey Alvarez office who she sees routinely.    Marie Lords MD, 10:57 AM 12/26/2011

## 2011-12-26 NOTE — Patient Instructions (Signed)
Discuss colonoscopy with Dr. Waynard Edwards and the gastroenterologist.  Discuss bone health and need to be on medication with with Dr. Waynard Edwards.  Follow up with me in one year

## 2012-02-02 ENCOUNTER — Ambulatory Visit: Payer: Medicare Other | Admitting: Lab

## 2012-02-02 ENCOUNTER — Telehealth: Payer: Self-pay | Admitting: Oncology

## 2012-02-02 ENCOUNTER — Encounter: Payer: Self-pay | Admitting: Oncology

## 2012-02-02 ENCOUNTER — Ambulatory Visit (HOSPITAL_BASED_OUTPATIENT_CLINIC_OR_DEPARTMENT_OTHER): Payer: Medicare Other | Admitting: Oncology

## 2012-02-02 VITALS — BP 161/83 | HR 74 | Temp 98.7°F

## 2012-02-02 DIAGNOSIS — Z9223 Personal history of estrogen therapy: Secondary | ICD-10-CM | POA: Diagnosis not present

## 2012-02-02 DIAGNOSIS — R079 Chest pain, unspecified: Secondary | ICD-10-CM | POA: Diagnosis not present

## 2012-02-02 DIAGNOSIS — Z170 Estrogen receptor positive status [ER+]: Secondary | ICD-10-CM | POA: Insufficient documentation

## 2012-02-02 DIAGNOSIS — E559 Vitamin D deficiency, unspecified: Secondary | ICD-10-CM

## 2012-02-02 DIAGNOSIS — Z853 Personal history of malignant neoplasm of breast: Secondary | ICD-10-CM | POA: Insufficient documentation

## 2012-02-02 DIAGNOSIS — C50419 Malignant neoplasm of upper-outer quadrant of unspecified female breast: Secondary | ICD-10-CM

## 2012-02-02 DIAGNOSIS — C50412 Malignant neoplasm of upper-outer quadrant of left female breast: Secondary | ICD-10-CM | POA: Insufficient documentation

## 2012-02-02 HISTORY — DX: Personal history of estrogen therapy: Z92.23

## 2012-02-02 LAB — COMPREHENSIVE METABOLIC PANEL
Albumin: 4.4 g/dL (ref 3.5–5.2)
BUN: 15 mg/dL (ref 6–23)
CO2: 26 mEq/L (ref 19–32)
Glucose, Bld: 94 mg/dL (ref 70–99)
Potassium: 4.4 mEq/L (ref 3.5–5.3)
Sodium: 139 mEq/L (ref 135–145)
Total Bilirubin: 0.4 mg/dL (ref 0.3–1.2)
Total Protein: 6.7 g/dL (ref 6.0–8.3)

## 2012-02-02 LAB — CBC WITH DIFFERENTIAL/PLATELET
Basophils Absolute: 0 10*3/uL (ref 0.0–0.1)
Eosinophils Absolute: 0.1 10*3/uL (ref 0.0–0.5)
HCT: 42.2 % (ref 34.8–46.6)
HGB: 14.3 g/dL (ref 11.6–15.9)
LYMPH%: 27.2 % (ref 14.0–49.7)
MCV: 89.7 fL (ref 79.5–101.0)
MONO#: 0.3 10*3/uL (ref 0.1–0.9)
MONO%: 6.8 % (ref 0.0–14.0)
NEUT#: 3.2 10*3/uL (ref 1.5–6.5)
Platelets: 149 10*3/uL (ref 145–400)
RBC: 4.71 10*6/uL (ref 3.70–5.45)
WBC: 5.1 10*3/uL (ref 3.9–10.3)

## 2012-02-02 NOTE — Progress Notes (Signed)
OFFICE PROGRESS NOTE  CC  Dr. 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  PRIOR THERAPY:  #1 patient underwent a lumpectomy in July 2003 for a 3.4 cm invasive ductal carcinoma that was ER positive PR positive HER-2/neu negative.  #2 she then received 6 cycles of adjuvant chemotherapy consisting of CMF followed by radiation therapy.  #3 she then received one year of tamoxifen that was discontinued on December 2004.  #4 she was then started on Arimidex 1 mg daily between 12/17/2003 to January 2010  CURRENT THERAPY:Observation  INTERVAL HISTORY: Marie Alvarez 78 y.o. female returns for Yearly followup visit. Overall she is doing well she still continues to have significant amount of trouble with her back. She does have some degenerative disease. She otherwise denies any fevers chills night sweats headaches shortness of breath chest pains palpitations. She has not had any breast masses that she has noticed. Today she does get tenderness in the left breast. Remainder of the 10 point review of systems is negative.  MEDICAL HISTORY: Past Medical History  Diagnosis Date  . Cancer 2003    BREAST-LEFT  . Hypertension   . Macular degeneration   . Osteoporosis   . Hx estrogen therapy 02/02/2012  . History of breast cancer in female 02/02/2012    ALLERGIES:  is allergic to sulfa antibiotics and prednisone.  MEDICATIONS:  Current Outpatient Prescriptions  Medication Sig Dispense Refill  . amLODipine (NORVASC) 5 MG tablet Take 5 mg by mouth daily.      Marland Kitchen aspirin 81 MG tablet Take 160 mg by mouth daily.      . Cholecalciferol (VITAMIN D-3 PO) Take by mouth.      . fish oil-omega-3 fatty acids 1000 MG capsule Take 2 g by mouth daily.      Marland Kitchen LISINOPRIL PO Take by mouth.      . NORTRIPTYLINE HCL PO Take by mouth.      . Pantoprazole Sodium (PROTONIX PO) Take by mouth.      Marland Kitchen PRAVASTATIN SODIUM PO Take by  mouth.      Marland Kitchen PRESCRIPTION MEDICATION EYE TABLETS        SURGICAL HISTORY:  Past Surgical History  Procedure Date  . Breast surgery 07-2002    LEFT LUMPECTOMY  . Tonsilectomy, adenoidectomy, bilateral myringotomy and tubes 1944  . Appendectomy 1946    REVIEW OF SYSTEMS:  Pertinent items are noted in HPI.   PHYSICAL EXAMINATION: General appearance: alert, cooperative and appears stated age Neck: no adenopathy, no carotid bruit, no JVD, supple, symmetrical, trachea midline and thyroid not enlarged, symmetric, no tenderness/mass/nodules Lymph nodes: Cervical, supraclavicular, and axillary nodes normal. Resp: clear to auscultation bilaterally and normal percussion bilaterally Back: symmetric, no curvature. ROM normal. No CVA tenderness. Cardio: regular rate and rhythm, S1, S2 normal, no murmur, click, rub or gallop GI: soft, non-tender; bowel sounds normal; no masses,  no organomegaly Extremities: extremities normal, atraumatic, no cyanosis or edema Neurologic: Alert and oriented X 3, normal strength and tone. Normal symmetric reflexes. Normal coordination and gait  ECOG PERFORMANCE STATUS: 1 - Symptomatic but completely ambulatory  Blood pressure 161/83, pulse 74, temperature 98.7 F (37.1 C), temperature source Oral.  LABORATORY DATA: Lab Results  Component Value Date   WBC 5.1 02/02/2012   HGB 14.3 02/02/2012   HCT 42.2 02/02/2012   MCV 89.7 02/02/2012   PLT 149 02/02/2012      Chemistry  Component Value Date/Time   NA 139 02/02/2011 1137   NA 138 02/02/2010 1015   K 3.8 02/02/2011 1137   K 4.0 02/02/2010 1015   CL 104 02/02/2011 1137   CL 99 02/02/2010 1015   CO2 23 02/02/2011 1137   CO2 31 02/02/2010 1015   BUN 15 02/02/2011 1137   BUN 12 02/02/2010 1015   CREATININE 0.69 02/02/2011 1137   CREATININE 0.7 02/02/2010 1015      Component Value Date/Time   CALCIUM 10.1 02/02/2011 1137   CALCIUM 10.0 02/02/2010 1015   ALKPHOS 48 02/02/2011 1137   ALKPHOS 63 02/02/2010 1015    AST 18 02/02/2011 1137   AST 20 02/02/2010 1015   ALT 12 02/02/2011 1137   BILITOT 0.6 02/02/2011 1137   BILITOT 0.90 02/02/2010 1015       RADIOGRAPHIC STUDIES:  No results found.  ASSESSMENT: 78 year old female with previous history of stage I invasive ductal carcinoma that was ER positive PR positive HER-2/neu negative originally measuring 3.4 cm diagnosed July 2003. She received lumpectomy followed by chemotherapy consisting of CMF CMF overall she tolerated this well. This was then followed by radiation therapy to the left breast. She received Arimidex from January 2005 to January 2010. She is without evidence of recurrent disease.   PLAN:  We will continue to follow the patient on a yearly basis. She will see Marie Alvarez in one years time in the survivor clinic.   All questions were answered. The patient knows to call the clinic with any problems, questions or concerns. We can certainly see the patient much sooner if necessary.  I spent 20 minutes counseling the patient face to face. The total time spent in the appointment was 30 minutes.    [name], MD Medical/Oncology [facility] [phone] (beeper) [phone] (Office)  02/02/2012, 2:22 PM

## 2012-02-02 NOTE — Telephone Encounter (Signed)
gve the pt her feb 2014 appt calendar °

## 2012-02-02 NOTE — Patient Instructions (Signed)
1. Continue to exercise and eat healthy  2. We will see you back in the survivor clinic in 1 years time, you will se Colman Cater our nurse specialist  3. If you have any problems please call [phone] and ask for the triage nurse or Dr. Rosine Beat nurse

## 2012-02-07 ENCOUNTER — Other Ambulatory Visit: Payer: PRIVATE HEALTH INSURANCE | Admitting: Lab

## 2012-02-07 ENCOUNTER — Ambulatory Visit: Payer: PRIVATE HEALTH INSURANCE | Admitting: Family

## 2012-02-10 ENCOUNTER — Other Ambulatory Visit: Payer: PRIVATE HEALTH INSURANCE | Admitting: Lab

## 2012-02-10 ENCOUNTER — Ambulatory Visit: Payer: PRIVATE HEALTH INSURANCE | Admitting: Family

## 2012-03-12 DIAGNOSIS — N368 Other specified disorders of urethra: Secondary | ICD-10-CM

## 2012-03-12 HISTORY — DX: Other specified disorders of urethra: N36.8

## 2012-03-28 DIAGNOSIS — R630 Anorexia: Secondary | ICD-10-CM | POA: Diagnosis not present

## 2012-03-28 DIAGNOSIS — R5383 Other fatigue: Secondary | ICD-10-CM | POA: Diagnosis not present

## 2012-03-28 DIAGNOSIS — R5381 Other malaise: Secondary | ICD-10-CM | POA: Diagnosis not present

## 2012-03-28 DIAGNOSIS — I10 Essential (primary) hypertension: Secondary | ICD-10-CM | POA: Diagnosis not present

## 2012-03-28 DIAGNOSIS — R109 Unspecified abdominal pain: Secondary | ICD-10-CM | POA: Diagnosis not present

## 2012-03-30 DIAGNOSIS — R5381 Other malaise: Secondary | ICD-10-CM | POA: Diagnosis not present

## 2012-03-30 DIAGNOSIS — D518 Other vitamin B12 deficiency anemias: Secondary | ICD-10-CM | POA: Diagnosis not present

## 2012-04-03 DIAGNOSIS — D518 Other vitamin B12 deficiency anemias: Secondary | ICD-10-CM | POA: Diagnosis not present

## 2012-04-05 ENCOUNTER — Other Ambulatory Visit: Payer: Self-pay | Admitting: Internal Medicine

## 2012-04-05 ENCOUNTER — Ambulatory Visit
Admission: RE | Admit: 2012-04-05 | Discharge: 2012-04-05 | Disposition: A | Discharge status: Home / Self Care | Payer: Medicare Other | Source: Ambulatory Visit | Attending: Internal Medicine | Admitting: Internal Medicine

## 2012-04-05 DIAGNOSIS — C50919 Malignant neoplasm of unspecified site of unspecified female breast: Secondary | ICD-10-CM | POA: Diagnosis not present

## 2012-04-05 DIAGNOSIS — R634 Abnormal weight loss: Secondary | ICD-10-CM

## 2012-04-05 DIAGNOSIS — R7301 Impaired fasting glucose: Secondary | ICD-10-CM | POA: Diagnosis not present

## 2012-04-05 DIAGNOSIS — K5730 Diverticulosis of large intestine without perforation or abscess without bleeding: Secondary | ICD-10-CM | POA: Diagnosis not present

## 2012-04-05 DIAGNOSIS — R5381 Other malaise: Secondary | ICD-10-CM | POA: Diagnosis not present

## 2012-04-05 DIAGNOSIS — N2889 Other specified disorders of kidney and ureter: Secondary | ICD-10-CM | POA: Diagnosis not present

## 2012-04-05 DIAGNOSIS — D518 Other vitamin B12 deficiency anemias: Secondary | ICD-10-CM | POA: Diagnosis not present

## 2012-04-05 MED ORDER — IOHEXOL 300 MG/ML  SOLN: 20.0000 mL | Freq: Once | INTRAMUSCULAR | Status: AC | PRN

## 2012-04-05 MED ORDER — IOHEXOL 300 MG/ML  SOLN
100.0000 mL | Freq: Once | INTRAMUSCULAR | Status: AC | PRN
  Administered 2012-04-05: 100 mL via INTRAVENOUS

## 2012-04-09 ENCOUNTER — Ambulatory Visit: Payer: Medicare Other | Admitting: Cardiology

## 2012-04-09 ENCOUNTER — Encounter (HOSPITAL_COMMUNITY): Payer: Medicare Other

## 2012-04-16 ENCOUNTER — Ambulatory Visit (INDEPENDENT_AMBULATORY_CARE_PROVIDER_SITE_OTHER): Payer: Medicare Other | Admitting: Cardiology

## 2012-04-16 ENCOUNTER — Encounter: Payer: Self-pay | Admitting: Cardiology

## 2012-04-16 DIAGNOSIS — E785 Hyperlipidemia, unspecified: Secondary | ICD-10-CM | POA: Diagnosis not present

## 2012-04-16 DIAGNOSIS — R079 Chest pain, unspecified: Secondary | ICD-10-CM | POA: Diagnosis not present

## 2012-04-16 DIAGNOSIS — I10 Essential (primary) hypertension: Secondary | ICD-10-CM

## 2012-04-16 HISTORY — DX: Chest pain, unspecified: R07.9

## 2012-04-16 NOTE — Progress Notes (Signed)
HPI: 78 year old female for evaluation of fatigue. Patient previously seen by Dr. Patty Sermons. Echocardiogram in February 2006 showed normal LV function, grade 1 diastolic dysfunction. Myoview was performed in March of 2006 secondary to chest pain. Her ejection fraction was 65% and there was no ischemia. In late March the patient had an acute gastrointestinal illness. She complained of nausea, diarrhea and abdominal discomfort for 4 days. She had decreased by mouth intake. She also described weakness. Her symptoms were somewhat prolonged and she is now gradually recovering. She also noticed chest discomfort. It was described as a tired feeling in her chest associated with exertion. There is no orthopnea, PND,, syncope. She has chronic lower extremity pedal edema which is unchanged. Because of the above, we were asked to further evaluate.  Current Outpatient Prescriptions  Medication Sig Dispense Refill  . amLODipine (NORVASC) 5 MG tablet Take 2.5 mg by mouth 2 (two) times daily.       Marland Kitchen aspirin 81 MG tablet Take 81 mg by mouth daily.       Marland Kitchen CALCIUM PO Take by mouth daily.      . chlordiazePOXIDE (LIBRIUM) 5 MG capsule Take 5 mg by mouth daily as needed.      . Cholecalciferol (VITAMIN D-3 PO) Take by mouth.      . Cyanocobalamin (VITAMIN B-12 IJ) Inject as directed as directed.      . fish oil-omega-3 fatty acids 1000 MG capsule Take 2 g by mouth daily.      Marland Kitchen LISINOPRIL PO Take 10 mg by mouth daily.       . Multiple Vitamins-Minerals (PRESERVISION AREDS) CAPS Take by mouth daily.      Marland Kitchen NORTRIPTYLINE HCL PO Take 10 mg by mouth 2 (two) times daily.       . Pantoprazole Sodium (PROTONIX PO) Take 40 mg by mouth daily.       Marland Kitchen PRAVASTATIN SODIUM PO Take 40 mg by mouth daily.         Allergies  Allergen Reactions  . Sulfa Antibiotics Hives  . Prednisone     "makes me goofy"    Past Medical History  Diagnosis Date  . Cancer 2003    BREAST-LEFT  . Hypertension   . Macular degeneration   .  Osteoporosis   . Hx estrogen therapy 02/02/2012  . Hyperlipidemia   . GERD (gastroesophageal reflux disease)     Past Surgical History  Procedure Date  . Breast surgery 07-2002    LEFT LUMPECTOMY  . Tonsilectomy, adenoidectomy, bilateral myringotomy and tubes 1944  . Appendectomy 1946    History   Social History  . Marital Status: Widowed    Spouse Name: N/A    Number of Children: 2  . Years of Education: N/A   Occupational History  . Not on file.   Social History Main Topics  . Smoking status: Never Smoker   . Smokeless tobacco: Never Used  . Alcohol Use: No  . Drug Use: No  . Sexually Active: Not Currently   Other Topics Concern  . Not on file   Social History Narrative  . No narrative on file    Family History  Problem Relation Age of Onset  . Heart disease Mother     Angina  . Heart disease Father     Unknown    ROS: Recent gastrointestinal illness and weakness but no fevers or chills, productive cough, hemoptysis, dysphasia, odynophagia, melena, hematochezia, dysuria, hematuria, rash, seizure activity, orthopnea, PND, claudication. Remaining systems  are negative.  Physical Exam:   Blood pressure 140/86, pulse 75, height 5' 3.5" (1.613 m), weight 61.236 kg (135 lb).  General:  Well developed/well nourished in NAD Skin warm/dry Patient not depressed No peripheral clubbing Back-normal HEENT-normal/normal eyelids Neck supple/normal carotid upstroke bilaterally; no bruits; no JVD; no thyromegaly chest - CTA/ normal expansion CV - RRR/normal S1 and S2; no murmurs, rubs or gallops;  PMI nondisplaced Abdomen -mild tenderness to palpation lower abdomen but no rebound, ND, no HSM, no mass, + bowel sounds, no bruit 2+ femoral pulses, no bruits Ext-trace edema, no chords, 2+ DP Neuro-grossly nonfocal  ECG NSR, PRWP, no ST changes

## 2012-04-16 NOTE — Assessment & Plan Note (Signed)
Blood pressure controlled. Continue present medications. 

## 2012-04-16 NOTE — Assessment & Plan Note (Signed)
Symptoms atypical; plan stress echo; if neg, no further cardiac eval.

## 2012-04-16 NOTE — Patient Instructions (Signed)
Your physician recommends that you schedule a follow-up appointment in: AS NEEDED  Your physician has requested that you have a stress echocardiogram. For further information please visit [email or web address]. Please follow instruction sheet as given.  

## 2012-04-16 NOTE — Assessment & Plan Note (Signed)
Management per primary care. 

## 2012-04-17 DIAGNOSIS — D518 Other vitamin B12 deficiency anemias: Secondary | ICD-10-CM | POA: Diagnosis not present

## 2012-04-30 ENCOUNTER — Encounter: Payer: Self-pay | Admitting: Cardiology

## 2012-04-30 ENCOUNTER — Ambulatory Visit (HOSPITAL_COMMUNITY): Payer: Medicare Other | Attending: Internal Medicine

## 2012-04-30 DIAGNOSIS — R0989 Other specified symptoms and signs involving the circulatory and respiratory systems: Secondary | ICD-10-CM | POA: Insufficient documentation

## 2012-04-30 DIAGNOSIS — E785 Hyperlipidemia, unspecified: Secondary | ICD-10-CM | POA: Insufficient documentation

## 2012-04-30 DIAGNOSIS — R0609 Other forms of dyspnea: Secondary | ICD-10-CM | POA: Insufficient documentation

## 2012-04-30 DIAGNOSIS — I10 Essential (primary) hypertension: Secondary | ICD-10-CM | POA: Diagnosis not present

## 2012-04-30 DIAGNOSIS — R5381 Other malaise: Secondary | ICD-10-CM | POA: Diagnosis not present

## 2012-04-30 DIAGNOSIS — R5383 Other fatigue: Secondary | ICD-10-CM | POA: Insufficient documentation

## 2012-04-30 DIAGNOSIS — R072 Precordial pain: Secondary | ICD-10-CM

## 2012-05-02 DIAGNOSIS — R1013 Epigastric pain: Secondary | ICD-10-CM | POA: Diagnosis not present

## 2012-05-02 DIAGNOSIS — R143 Flatulence: Secondary | ICD-10-CM | POA: Diagnosis not present

## 2012-05-02 DIAGNOSIS — R141 Gas pain: Secondary | ICD-10-CM | POA: Diagnosis not present

## 2012-05-02 DIAGNOSIS — R634 Abnormal weight loss: Secondary | ICD-10-CM | POA: Diagnosis not present

## 2012-05-08 DIAGNOSIS — H35369 Drusen (degenerative) of macula, unspecified eye: Secondary | ICD-10-CM | POA: Diagnosis not present

## 2012-05-08 DIAGNOSIS — H2510 Age-related nuclear cataract, unspecified eye: Secondary | ICD-10-CM | POA: Diagnosis not present

## 2012-05-08 DIAGNOSIS — Z961 Presence of intraocular lens: Secondary | ICD-10-CM | POA: Diagnosis not present

## 2012-07-05 ENCOUNTER — Telehealth: Payer: Self-pay | Admitting: Oncology

## 2012-07-05 NOTE — Telephone Encounter (Signed)
lmonvm adviisng the pt of her appt time change on 02/01/2012 from 1:30pm to 2:00pm due to the md's schedule

## 2012-07-10 DIAGNOSIS — R634 Abnormal weight loss: Secondary | ICD-10-CM | POA: Diagnosis not present

## 2012-07-10 DIAGNOSIS — A0811 Acute gastroenteropathy due to Norwalk agent: Secondary | ICD-10-CM | POA: Diagnosis not present

## 2012-07-10 DIAGNOSIS — A09 Infectious gastroenteritis and colitis, unspecified: Secondary | ICD-10-CM | POA: Diagnosis not present

## 2012-07-24 ENCOUNTER — Other Ambulatory Visit: Payer: Self-pay | Admitting: Internal Medicine

## 2012-07-24 DIAGNOSIS — Z1231 Encounter for screening mammogram for malignant neoplasm of breast: Secondary | ICD-10-CM

## 2012-08-07 DIAGNOSIS — D518 Other vitamin B12 deficiency anemias: Secondary | ICD-10-CM | POA: Diagnosis not present

## 2012-08-07 DIAGNOSIS — E559 Vitamin D deficiency, unspecified: Secondary | ICD-10-CM | POA: Diagnosis not present

## 2012-08-07 DIAGNOSIS — I10 Essential (primary) hypertension: Secondary | ICD-10-CM | POA: Diagnosis not present

## 2012-08-07 DIAGNOSIS — R82998 Other abnormal findings in urine: Secondary | ICD-10-CM | POA: Diagnosis not present

## 2012-08-07 DIAGNOSIS — E785 Hyperlipidemia, unspecified: Secondary | ICD-10-CM | POA: Diagnosis not present

## 2012-08-14 DIAGNOSIS — D518 Other vitamin B12 deficiency anemias: Secondary | ICD-10-CM | POA: Diagnosis not present

## 2012-08-14 DIAGNOSIS — Z23 Encounter for immunization: Secondary | ICD-10-CM | POA: Diagnosis not present

## 2012-08-14 DIAGNOSIS — Z0000 Encounter for general adult medical examination without abnormal findings: Secondary | ICD-10-CM | POA: Diagnosis not present

## 2012-08-14 DIAGNOSIS — F411 Generalized anxiety disorder: Secondary | ICD-10-CM | POA: Diagnosis not present

## 2012-08-14 DIAGNOSIS — R7301 Impaired fasting glucose: Secondary | ICD-10-CM | POA: Diagnosis not present

## 2012-08-20 DIAGNOSIS — Z1212 Encounter for screening for malignant neoplasm of rectum: Secondary | ICD-10-CM | POA: Diagnosis not present

## 2012-08-29 ENCOUNTER — Other Ambulatory Visit: Payer: Self-pay | Admitting: Internal Medicine

## 2012-08-29 ENCOUNTER — Ambulatory Visit
Admission: RE | Admit: 2012-08-29 | Discharge: 2012-08-29 | Disposition: A | Discharge status: Home / Self Care | Payer: Medicare Other | Source: Ambulatory Visit | Attending: Internal Medicine | Admitting: Internal Medicine

## 2012-08-29 DIAGNOSIS — N644 Mastodynia: Secondary | ICD-10-CM

## 2012-08-29 DIAGNOSIS — Z1231 Encounter for screening mammogram for malignant neoplasm of breast: Secondary | ICD-10-CM

## 2012-10-02 DIAGNOSIS — M899 Disorder of bone, unspecified: Secondary | ICD-10-CM | POA: Diagnosis not present

## 2012-10-02 DIAGNOSIS — M949 Disorder of cartilage, unspecified: Secondary | ICD-10-CM | POA: Diagnosis not present

## 2012-10-02 DIAGNOSIS — Z23 Encounter for immunization: Secondary | ICD-10-CM | POA: Diagnosis not present

## 2012-10-25 ENCOUNTER — Ambulatory Visit
Admission: RE | Admit: 2012-10-25 | Discharge: 2012-10-25 | Disposition: A | Discharge status: Home / Self Care | Payer: Medicare Other | Source: Ambulatory Visit

## 2012-10-25 ENCOUNTER — Other Ambulatory Visit: Payer: Self-pay | Admitting: Internal Medicine

## 2012-10-25 ENCOUNTER — Ambulatory Visit: Admission: RE | Admit: 2012-10-25 | Payer: Medicare Other | Source: Ambulatory Visit

## 2012-10-25 DIAGNOSIS — Z1231 Encounter for screening mammogram for malignant neoplasm of breast: Secondary | ICD-10-CM | POA: Diagnosis not present

## 2012-12-06 ENCOUNTER — Emergency Department (INDEPENDENT_AMBULATORY_CARE_PROVIDER_SITE_OTHER)
Admission: EM | Admit: 2012-12-06 | Discharge: 2012-12-06 | Disposition: A | Discharge status: Home / Self Care | Payer: Medicare Other | Source: Home / Self Care | Attending: Family Medicine | Admitting: Family Medicine

## 2012-12-06 ENCOUNTER — Encounter (HOSPITAL_COMMUNITY): Payer: Self-pay | Admitting: *Deleted

## 2012-12-06 DIAGNOSIS — B0239 Other herpes zoster eye disease: Secondary | ICD-10-CM

## 2012-12-06 MED ORDER — VALACYCLOVIR HCL 1 G PO TABS: 1000.0000 mg | ORAL_TABLET | Freq: Three times a day (TID) | ORAL | Status: AC

## 2012-12-06 MED ORDER — GANCICLOVIR 0.15 % OP GEL: 1.0000 "application " | Freq: Four times a day (QID) | OPHTHALMIC | Status: DC

## 2012-12-06 MED ORDER — ONDANSETRON 4 MG PO TBDP: 4.0000 mg | ORAL_TABLET | Freq: Three times a day (TID) | ORAL | Status: DC | PRN

## 2012-12-06 MED ORDER — TETRACAINE HCL 0.5 % OP SOLN
OPHTHALMIC | Status: AC
  Filled 2012-12-06: qty 2

## 2012-12-06 MED ORDER — HYDROCODONE-ACETAMINOPHEN 5-500 MG PO TABS: 1.0000 | ORAL_TABLET | Freq: Three times a day (TID) | ORAL | Status: DC | PRN

## 2012-12-06 MED ORDER — ONDANSETRON 4 MG PO TBDP
ORAL_TABLET | ORAL | Status: AC
  Filled 2012-12-06: qty 1

## 2012-12-06 MED ORDER — ONDANSETRON 4 MG PO TBDP
4.0000 mg | ORAL_TABLET | Freq: Once | ORAL | Status: AC
  Administered 2012-12-06: 4 mg via ORAL

## 2012-12-06 NOTE — ED Notes (Signed)
Pt  Has  Symptoms  Of a  Rash      With  Blurred  Vision  Pain    In  Face  And  Back of headaches    As  Well  As     Redness  Of  Her  r  Eye          The  Pt     Reports  She  Had  Shingles   Injection   This  Year

## 2012-12-06 NOTE — ED Provider Notes (Signed)
History     CSN: [id number]  Arrival date & time 12/06/12  1045   First MD Initiated Contact with Patient 12/06/12 1136      Chief Complaint  Patient presents with  . Rash    (Consider location/radiation/quality/duration/timing/severity/associated sxs/prior treatment) HPI Comments: 78 year old female with history of hypertension and ductal cell carcinoma among other comorbidities. Here complaining of a rash in the right side of her forehead for over a week. Patient reports she did experience shooting burning type of pain in her forehead and scalp during the last week and yesterday she woke up with a swollen right upper lid also with matting. Swelling is better today she is still reports some burning sensation in her upper lid. No pain inside her eye but she does have some redness in her eye. No photophobia. No visual changes from baseline. Patient also has experienced some general malaise with chills and body aches during the last week. Denies fever. Patient reports side effects with systemic steroids like euphoria and disorientation.   Past Medical History  Diagnosis Date  . Cancer 2003    BREAST-LEFT  . Hypertension   . Macular degeneration   . Osteoporosis   . Hx estrogen therapy 02/02/2012  . Hyperlipidemia   . GERD (gastroesophageal reflux disease)     Past Surgical History  Procedure Date  . Breast surgery 07-2002    LEFT LUMPECTOMY  . Tonsilectomy, adenoidectomy, bilateral myringotomy and tubes 1944  . Appendectomy 1946    Family History  Problem Relation Age of Onset  . Heart disease Mother     Angina  . Heart disease Father     Unknown    History  Substance Use Topics  . Smoking status: Never Smoker   . Smokeless tobacco: Never Used  . Alcohol Use: No    OB History    Grav Para Term Preterm Abortions TAB SAB Ect Mult Living   3 2   1  1          Review of Systems  Constitutional: Negative for fever and chills.  HENT: Positive for congestion.  Negative for sore throat, rhinorrhea and mouth sores.   Eyes: Positive for pain and redness. Negative for discharge, itching and visual disturbance.  Respiratory: Negative for cough and shortness of breath.   Cardiovascular: Negative for chest pain.  Gastrointestinal: Positive for nausea. Negative for vomiting.  Skin: Positive for rash.  Neurological: Negative for dizziness.  All other systems reviewed and are negative.    Allergies  Sulfa antibiotics and Prednisone  Home Medications   Current Outpatient Rx  Name  Route  Sig  Dispense  Refill  . AMLODIPINE BESYLATE 5 MG PO TABS   Oral   Take 2.5 mg by mouth 2 (two) times daily.          . ASPIRIN 81 MG PO TABS   Oral   Take 81 mg by mouth daily.          Marland Kitchen CALCIUM PO   Oral   Take by mouth daily.         . CHLORDIAZEPOXIDE HCL 5 MG PO CAPS   Oral   Take 5 mg by mouth daily as needed.         Marland Kitchen VITAMIN D-3 PO   Oral   Take by mouth.         Marland Kitchen VITAMIN B-12 IJ   Injection   Inject as directed as directed.         Marland Kitchen  OMEGA-3 FATTY ACIDS 1000 MG PO CAPS   Oral   Take 2 g by mouth daily.         Marland Kitchen GANCICLOVIR 0.15 % OP GEL   Ophthalmic   Apply 1 application to eye 4 (four) times daily.   5 g   0     To right eye   . HYDROCODONE-ACETAMINOPHEN 5-500 MG PO TABS   Oral   Take 1 tablet by mouth every 8 (eight) hours as needed for pain.   20 tablet   0   . LISINOPRIL PO   Oral   Take 10 mg by mouth daily.          Marland Kitchen PRESERVISION AREDS PO CAPS   Oral   Take by mouth daily.         Marland Kitchen NORTRIPTYLINE HCL PO   Oral   Take 10 mg by mouth 2 (two) times daily.          Marland Kitchen ONDANSETRON 4 MG PO TBDP   Oral   Take 1 tablet (4 mg total) by mouth every 8 (eight) hours as needed for nausea.   10 tablet   0   . PROTONIX PO   Oral   Take 40 mg by mouth daily.          Marland Kitchen PRAVASTATIN SODIUM PO   Oral   Take 40 mg by mouth daily.          Marland Kitchen VALACYCLOVIR HCL 1 G PO TABS   Oral   Take 1  tablet (1,000 mg total) by mouth 3 (three) times daily.   21 tablet   0     BP 152/56  Pulse 93  Temp 99 F (37.2 C) (Oral)  Resp 16  SpO2 100%  Physical Exam  Nursing note and vitals reviewed. Constitutional: She is oriented to person, place, and time. She appears well-developed and well-nourished. No distress.  HENT:  Head: Normocephalic and atraumatic.  Right Ear: External ear normal.  Left Ear: External ear normal.  Mouth/Throat: Oropharynx is clear and moist. No oropharyngeal exudate.       Nasal congestion, rhinorrhea. No rash involving the nose.  Eyes: EOM are normal. Pupils are equal, round, and reactive to light. No scleral icterus.       Right eye: Conjunctival erythema. No peri ciliary injection. Right upper lead is mildly swollen and erythematous with mild scabbing close to the eye lashes. No purulent exudates.  No corneal ulcers, abrasions or dendritic inclusions on fluorescein stain. No periorbital edema, fluctuations or tenderness. Left thigh with normal exam.  Neck: Neck supple.  Cardiovascular: Normal rate and regular rhythm.   Pulmonary/Chest: Effort normal and breath sounds normal. No respiratory distress. She has no wheezes. She has no rales. She exhibits no tenderness.  Lymphadenopathy:    She has no cervical adenopathy.  Neurological: She is alert and oriented to person, place, and time.  Skin: Rash noted. She is not diaphoretic.       There is a raised erythematous papular confluent rash located in right for head and extended to the right side of the frontal parietal and temporal scalp. Rash is dry and scabbing. no current vesicles. No pustules. There is hyperesthesia of the skin involved.    ED Course  Procedures (including critical care time)  Labs Reviewed - No data to display No results found.   1. Zoster blepharitis       MDM  No corneal involvement on fluorescein staining. I discussed  case with Dr.  Gwen Pounds (ophthalmologist on call).  Recommended antiviral ophthalmic gel and followup next week if no corneal ulcers. Okay not to use steroids as patient is allergic to prednisone. Prescribed Zirgan ophthalmic gel and valacyclovir your oral. Also a prescription for Vicodin and ondansetron was given. Supportive care and red flags that should prompt her return to medical attention discussed with patient and provided in writing. Patient was instructed to call tomorrow to her ophthalmologist or Dr. Gwen Pounds for a followup appointment during the week.        Sharin Grave, MD 12/08/12 1131

## 2012-12-07 DIAGNOSIS — B0239 Other herpes zoster eye disease: Secondary | ICD-10-CM | POA: Diagnosis not present

## 2012-12-07 DIAGNOSIS — Z961 Presence of intraocular lens: Secondary | ICD-10-CM | POA: Diagnosis not present

## 2012-12-07 DIAGNOSIS — B0233 Zoster keratitis: Secondary | ICD-10-CM | POA: Diagnosis not present

## 2012-12-07 DIAGNOSIS — H2510 Age-related nuclear cataract, unspecified eye: Secondary | ICD-10-CM | POA: Diagnosis not present

## 2012-12-07 DIAGNOSIS — H16149 Punctate keratitis, unspecified eye: Secondary | ICD-10-CM | POA: Diagnosis not present

## 2012-12-13 ENCOUNTER — Encounter: Payer: Self-pay | Admitting: Gynecology

## 2012-12-13 DIAGNOSIS — H35369 Drusen (degenerative) of macula, unspecified eye: Secondary | ICD-10-CM | POA: Diagnosis not present

## 2012-12-13 DIAGNOSIS — B0239 Other herpes zoster eye disease: Secondary | ICD-10-CM | POA: Diagnosis not present

## 2012-12-13 DIAGNOSIS — H2000 Unspecified acute and subacute iridocyclitis: Secondary | ICD-10-CM | POA: Diagnosis not present

## 2012-12-13 DIAGNOSIS — B0052 Herpesviral keratitis: Secondary | ICD-10-CM | POA: Diagnosis not present

## 2012-12-19 DIAGNOSIS — H2510 Age-related nuclear cataract, unspecified eye: Secondary | ICD-10-CM | POA: Diagnosis not present

## 2012-12-19 DIAGNOSIS — Z961 Presence of intraocular lens: Secondary | ICD-10-CM | POA: Diagnosis not present

## 2012-12-19 DIAGNOSIS — B0239 Other herpes zoster eye disease: Secondary | ICD-10-CM | POA: Diagnosis not present

## 2012-12-19 DIAGNOSIS — H2000 Unspecified acute and subacute iridocyclitis: Secondary | ICD-10-CM | POA: Diagnosis not present

## 2012-12-27 ENCOUNTER — Encounter: Payer: Medicare Other | Admitting: Gynecology

## 2013-01-02 DIAGNOSIS — B0239 Other herpes zoster eye disease: Secondary | ICD-10-CM | POA: Diagnosis not present

## 2013-01-02 DIAGNOSIS — H3530 Unspecified macular degeneration: Secondary | ICD-10-CM | POA: Diagnosis not present

## 2013-01-02 DIAGNOSIS — H2000 Unspecified acute and subacute iridocyclitis: Secondary | ICD-10-CM | POA: Diagnosis not present

## 2013-01-02 DIAGNOSIS — Z961 Presence of intraocular lens: Secondary | ICD-10-CM | POA: Diagnosis not present

## 2013-01-31 ENCOUNTER — Telehealth: Payer: Self-pay | Admitting: Oncology

## 2013-01-31 ENCOUNTER — Ambulatory Visit (HOSPITAL_BASED_OUTPATIENT_CLINIC_OR_DEPARTMENT_OTHER): Payer: Medicare Other | Admitting: Oncology

## 2013-01-31 ENCOUNTER — Encounter: Payer: Self-pay | Admitting: Oncology

## 2013-01-31 ENCOUNTER — Other Ambulatory Visit: Payer: Medicare Other | Admitting: Lab

## 2013-01-31 VITALS — BP 161/75 | HR 92 | Temp 97.9°F | Resp 20 | Ht 63.5 in | Wt 136.9 lb

## 2013-01-31 DIAGNOSIS — E559 Vitamin D deficiency, unspecified: Secondary | ICD-10-CM

## 2013-01-31 DIAGNOSIS — Z9223 Personal history of estrogen therapy: Secondary | ICD-10-CM

## 2013-01-31 DIAGNOSIS — C50419 Malignant neoplasm of upper-outer quadrant of unspecified female breast: Secondary | ICD-10-CM | POA: Diagnosis not present

## 2013-01-31 DIAGNOSIS — Z853 Personal history of malignant neoplasm of breast: Secondary | ICD-10-CM

## 2013-01-31 LAB — CBC WITH DIFFERENTIAL/PLATELET
BASO%: 0.5 % (ref 0.0–2.0)
EOS%: 2.5 % (ref 0.0–7.0)
HCT: 42.9 % (ref 34.8–46.6)
MCH: 30.6 pg (ref 25.1–34.0)
MCHC: 33.8 g/dL (ref 31.5–36.0)
NEUT%: 66.9 % (ref 38.4–76.8)
RBC: 4.74 10*6/uL (ref 3.70–5.45)
WBC: 7.6 10*3/uL (ref 3.9–10.3)
lymph#: 1.7 10*3/uL (ref 0.9–3.3)

## 2013-01-31 LAB — COMPREHENSIVE METABOLIC PANEL (CC13)
ALT: 11 U/L (ref 0–55)
AST: 15 U/L (ref 5–34)
Albumin: 3.9 g/dL (ref 3.5–5.0)
Alkaline Phosphatase: 55 U/L (ref 40–150)
BUN: 17.9 mg/dL (ref 7.0–26.0)
Creatinine: 0.8 mg/dL (ref 0.6–1.1)
Potassium: 4.1 mEq/L (ref 3.5–5.1)

## 2013-01-31 NOTE — Progress Notes (Signed)
OFFICE PROGRESS NOTE  CC  Dr. 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  PRIOR THERAPY:  #1 patient underwent a lumpectomy in July 2003 for a 3.4 cm invasive ductal carcinoma that was ER positive PR positive HER-2/neu negative.  #2 she then received 6 cycles of adjuvant chemotherapy consisting of CMF followed by radiation therapy.  #3 she then received one year of tamoxifen that was discontinued on December 2004.  #4 she was then started on Arimidex 1 mg daily between 12/17/2003 to January 2010  CURRENT THERAPY:Observation  INTERVAL HISTORY: Marie Alvarez 79 y.o. female returns for Yearly followup visit. Overall she is doing well she still continues to have significant amount of trouble with her back. She does have some degenerative disease. She otherwise denies any fevers chills night sweats headaches shortness of breath chest pains palpitations. She has not had any breast masses that she has noticed. Today she does get tenderness in the left breast. Remainder of the 10 point review of systems is negative.  MEDICAL HISTORY: Past Medical History  Diagnosis Date  . Cancer 2003    BREAST-LEFT  . Hypertension   . Macular degeneration   . Osteoporosis   . Hx estrogen therapy 02/02/2012  . Hyperlipidemia   . GERD (gastroesophageal reflux disease)     ALLERGIES:  is allergic to sulfa antibiotics and prednisone.  MEDICATIONS:  Current Outpatient Prescriptions  Medication Sig Dispense Refill  . amLODipine (NORVASC) 5 MG tablet Take 2.5 mg by mouth 2 (two) times daily.       Marland Kitchen aspirin 81 MG tablet Take 81 mg by mouth daily.       Marland Kitchen CALCIUM PO Take by mouth daily.      . chlordiazePOXIDE (LIBRIUM) 5 MG capsule Take 5 mg by mouth daily as needed.      . Cholecalciferol (VITAMIN D-3 PO) Take by mouth.      . Cyanocobalamin (VITAMIN B-12 IJ) Inject as directed as directed.      . fish oil-omega-3  fatty acids 1000 MG capsule Take 2 g by mouth daily.      . Ganciclovir 0.15 % GEL Apply 1 application to eye 4 (four) times daily.  5 g  0  . HYDROcodone-acetaminophen (VICODIN) 5-500 MG per tablet Take 1 tablet by mouth every 8 (eight) hours as needed for pain.  20 tablet  0  . LISINOPRIL PO Take 10 mg by mouth daily.       Marland Kitchen NORTRIPTYLINE HCL PO Take 10 mg by mouth 2 (two) times daily.       . Pantoprazole Sodium (PROTONIX PO) Take 40 mg by mouth daily.       Marland Kitchen PRAVASTATIN SODIUM PO Take 40 mg by mouth daily.       . Multiple Vitamins-Minerals (PRESERVISION AREDS) CAPS Take by mouth daily.      . ondansetron (ZOFRAN-ODT) 4 MG disintegrating tablet Take 1 tablet (4 mg total) by mouth every 8 (eight) hours as needed for nausea.  10 tablet  0   No current facility-administered medications for this visit.    SURGICAL HISTORY:  Past Surgical History  Procedure Laterality Date  . Breast surgery  07-2002    LEFT LUMPECTOMY  . Tonsilectomy, adenoidectomy, bilateral myringotomy and tubes  1944  . Appendectomy  1946    REVIEW OF SYSTEMS:  Pertinent items are noted in HPI.   PHYSICAL EXAMINATION: General appearance: alert, cooperative and  appears stated age Neck: no adenopathy, no carotid bruit, no JVD, supple, symmetrical, trachea midline and thyroid not enlarged, symmetric, no tenderness/mass/nodules Lymph nodes: Cervical, supraclavicular, and axillary nodes normal. Resp: clear to auscultation bilaterally and normal percussion bilaterally Back: symmetric, no curvature. ROM normal. No CVA tenderness. Cardio: regular rate and rhythm, S1, S2 normal, no murmur, click, rub or gallop GI: soft, non-tender; bowel sounds normal; no masses,  no organomegaly Extremities: extremities normal, atraumatic, no cyanosis or edema Neurologic: Alert and oriented X 3, normal strength and tone. Normal symmetric reflexes. Normal coordination and gait  ECOG PERFORMANCE STATUS: 1 - Symptomatic but completely  ambulatory  Blood pressure 161/75, pulse 92, temperature 97.9 F (36.6 C), temperature source Oral, resp. rate 20, height 5' 3.5" (1.613 m), weight 136 lb 14.4 oz (62.097 kg).  LABORATORY DATA: Lab Results  Component Value Date   WBC 7.6 01/31/2013   HGB 14.5 01/31/2013   HCT 42.9 01/31/2013   MCV 90.5 01/31/2013   PLT 184 01/31/2013      Chemistry      Component Value Date/Time   NA 139 02/02/2012 1159   NA 138 02/02/2010 1015   K 4.4 02/02/2012 1159   K 4.0 02/02/2010 1015   CL 104 02/02/2012 1159   CL 99 02/02/2010 1015   CO2 26 02/02/2012 1159   CO2 31 02/02/2010 1015   BUN 15 02/02/2012 1159   BUN 12 02/02/2010 1015   CREATININE 0.70 02/02/2012 1159   CREATININE 0.7 02/02/2010 1015      Component Value Date/Time   CALCIUM 10.4 02/02/2012 1159   CALCIUM 10.0 02/02/2010 1015   ALKPHOS 45 02/02/2012 1159   ALKPHOS 63 02/02/2010 1015   AST 20 02/02/2012 1159   AST 20 02/02/2010 1015   ALT 13 02/02/2012 1159   BILITOT 0.4 02/02/2012 1159   BILITOT 0.90 02/02/2010 1015       RADIOGRAPHIC STUDIES:  No results found.  ASSESSMENT: 79 year old female with previous history of stage I invasive ductal carcinoma that was ER positive PR positive HER-2/neu negative originally measuring 3.4 cm diagnosed July 2003. She received lumpectomy followed by chemotherapy consisting of CMF CMF overall she tolerated this well. This was then followed by radiation therapy to the left breast. She received Arimidex from January 2005 to January 2010. She is without evidence of recurrent disease.   PLAN:  We will continue to follow the patient on a yearly basis.   All questions were answered. The patient knows to call the clinic with any problems, questions or concerns. We can certainly see the patient much sooner if necessary.  I spent 15 minutes counseling the patient face to face. The total time spent in the appointment was 30 minutes.    [name], MD Medical/Oncology [facility] [phone] (beeper) [phone] (Office)  01/31/2013, 2:36 PM

## 2013-01-31 NOTE — Patient Instructions (Addendum)
Doing well  I will see you back in 1 year 

## 2013-02-01 LAB — VITAMIN D 25 HYDROXY (VIT D DEFICIENCY, FRACTURES): Vit D, 25-Hydroxy: 43 ng/mL (ref 30–89)

## 2013-02-05 ENCOUNTER — Encounter: Payer: Self-pay | Admitting: Gynecology

## 2013-02-05 ENCOUNTER — Ambulatory Visit (INDEPENDENT_AMBULATORY_CARE_PROVIDER_SITE_OTHER): Payer: Medicare Other | Admitting: Gynecology

## 2013-02-05 VITALS — BP 134/80 | Ht 63.0 in | Wt 136.0 lb

## 2013-02-05 DIAGNOSIS — M810 Age-related osteoporosis without current pathological fracture: Secondary | ICD-10-CM

## 2013-02-05 DIAGNOSIS — N952 Postmenopausal atrophic vaginitis: Secondary | ICD-10-CM | POA: Diagnosis not present

## 2013-02-05 DIAGNOSIS — N368 Other specified disorders of urethra: Secondary | ICD-10-CM

## 2013-02-05 DIAGNOSIS — N398 Other specified disorders of urinary system: Secondary | ICD-10-CM | POA: Diagnosis not present

## 2013-02-05 NOTE — Patient Instructions (Signed)
Office will help you make an appointment to see a urologist about the cyst in your vagina. Follow up here for exam in one year. Sooner if any issues.

## 2013-02-05 NOTE — Progress Notes (Signed)
Marie Alvarez 19-Sep-1931 [id number]        79 y.o.  [id number] for follow up exam.  Overall doing well.  Past medical history,surgical history, medications, allergies, family history and social history were all reviewed and documented in the EPIC chart. ROS:  Was performed and pertinent positives and negatives are included in the history.  Exam: Kim assistant Filed Vitals:   02/05/13 1109  BP: 134/80  Height: 5\' 3"  (1.6 m)  Weight: 136 lb (61.689 kg)   General appearance  Normal Skin grossly normal Head/Neck normal with no cervical or supraclavicular adenopathy thyroid normal Lungs  clear Cardiac RR, without RMG Abdominal  soft, nontender, without masses, organomegaly or hernia Breasts  examined lying and sitting. Right without masses, retractions, discharge or axillary adenopathy.  Left status post lumpectomy scarring, well-healed no masses retractions discharge adenopathy. Pelvic  Ext/BUS/vagina  With atrophic changes. Apparent 1.5 cm suburethral cyst approximately 2-3 cm 12:00 into the vagina, nontender. No overlying mucosal changes.  Cervix  normal atrophic  Uterus  small, normal size, shape and contour, midline and mobile nontender   Adnexa  Without masses or tenderness    Anus and perineum  normal   Rectovaginal  normal sphincter tone without palpated masses or tenderness.    Assessment/Plan:  79 y.o. Marie Alvarez female for annual exam.   1. Suburethral cyst. Asymptomatic to the patient. We'll have urology take a look to make sure nothing further needs to be done and we'll make that appointment for her. 2. Atrophic vaginal changes, asymptomatic to her. Not sexually active. We'll continue to monitor. 3. Postmenopausal without significant symptoms or bleeding. She knows to alert me with any bleeding. 4. Mammography 10/2012. Repeat annually. SBE monthly reviewed. 5. Pap smear 2012. No Pap smear done today. No history of abnormal Pap smears. Reviewed current screening guidelines and will  stop screening as she is over the age of 89. 6. Osteoporosis. Actively being followed by Dr. Waynard Edwards. Had been on bisphosphate but this was stopped and she'll continue to follow up with them for this. 7. Colonoscopy. Patient was told she does not need to have any further colonoscopies. I asked her to make sure she confirms this with Dr. Waynard Edwards. 8. Health maintenance. No blood work done as it is all done through Dr. Laurey Morale office who she actively sees. Follow up with urology otherwise follow up here in one year.    Dara Lords MD, 11:43 AM 02/05/2013

## 2013-02-22 ENCOUNTER — Telehealth: Payer: Self-pay | Admitting: *Deleted

## 2013-02-22 NOTE — Telephone Encounter (Signed)
appt on 03/13/13 with Dr.Nesi at 11:00 am pt informed notes and labs faxed.

## 2013-03-13 DIAGNOSIS — N368 Other specified disorders of urethra: Secondary | ICD-10-CM | POA: Diagnosis not present

## 2013-03-15 ENCOUNTER — Encounter: Payer: Self-pay | Admitting: Gynecology

## 2013-03-19 DIAGNOSIS — H04129 Dry eye syndrome of unspecified lacrimal gland: Secondary | ICD-10-CM | POA: Diagnosis not present

## 2013-03-19 DIAGNOSIS — H1045 Other chronic allergic conjunctivitis: Secondary | ICD-10-CM | POA: Diagnosis not present

## 2013-05-16 DIAGNOSIS — H02409 Unspecified ptosis of unspecified eyelid: Secondary | ICD-10-CM | POA: Diagnosis not present

## 2013-05-16 DIAGNOSIS — H3530 Unspecified macular degeneration: Secondary | ICD-10-CM | POA: Diagnosis not present

## 2013-05-16 DIAGNOSIS — H2510 Age-related nuclear cataract, unspecified eye: Secondary | ICD-10-CM | POA: Diagnosis not present

## 2013-05-16 DIAGNOSIS — Z961 Presence of intraocular lens: Secondary | ICD-10-CM | POA: Diagnosis not present

## 2013-06-13 DIAGNOSIS — H2510 Age-related nuclear cataract, unspecified eye: Secondary | ICD-10-CM | POA: Diagnosis not present

## 2013-06-17 DIAGNOSIS — H269 Unspecified cataract: Secondary | ICD-10-CM | POA: Diagnosis not present

## 2013-06-17 DIAGNOSIS — H2510 Age-related nuclear cataract, unspecified eye: Secondary | ICD-10-CM | POA: Diagnosis not present

## 2013-06-25 DIAGNOSIS — H9209 Otalgia, unspecified ear: Secondary | ICD-10-CM | POA: Diagnosis not present

## 2013-06-25 DIAGNOSIS — H6120 Impacted cerumen, unspecified ear: Secondary | ICD-10-CM | POA: Diagnosis not present

## 2013-06-25 DIAGNOSIS — J3489 Other specified disorders of nose and nasal sinuses: Secondary | ICD-10-CM | POA: Diagnosis not present

## 2013-06-28 DIAGNOSIS — L259 Unspecified contact dermatitis, unspecified cause: Secondary | ICD-10-CM | POA: Diagnosis not present

## 2013-06-28 DIAGNOSIS — T148XXA Other injury of unspecified body region, initial encounter: Secondary | ICD-10-CM | POA: Diagnosis not present

## 2013-06-28 DIAGNOSIS — L719 Rosacea, unspecified: Secondary | ICD-10-CM | POA: Diagnosis not present

## 2013-06-28 DIAGNOSIS — D235 Other benign neoplasm of skin of trunk: Secondary | ICD-10-CM | POA: Diagnosis not present

## 2013-09-03 DIAGNOSIS — L570 Actinic keratosis: Secondary | ICD-10-CM | POA: Diagnosis not present

## 2013-09-03 DIAGNOSIS — L259 Unspecified contact dermatitis, unspecified cause: Secondary | ICD-10-CM | POA: Diagnosis not present

## 2013-09-16 ENCOUNTER — Other Ambulatory Visit: Payer: Self-pay

## 2013-09-16 DIAGNOSIS — Z1231 Encounter for screening mammogram for malignant neoplasm of breast: Secondary | ICD-10-CM

## 2013-09-18 DIAGNOSIS — D518 Other vitamin B12 deficiency anemias: Secondary | ICD-10-CM | POA: Diagnosis not present

## 2013-09-18 DIAGNOSIS — R82998 Other abnormal findings in urine: Secondary | ICD-10-CM | POA: Diagnosis not present

## 2013-09-18 DIAGNOSIS — R7301 Impaired fasting glucose: Secondary | ICD-10-CM | POA: Diagnosis not present

## 2013-09-18 DIAGNOSIS — E785 Hyperlipidemia, unspecified: Secondary | ICD-10-CM | POA: Diagnosis not present

## 2013-09-18 DIAGNOSIS — M899 Disorder of bone, unspecified: Secondary | ICD-10-CM | POA: Diagnosis not present

## 2013-09-18 DIAGNOSIS — I10 Essential (primary) hypertension: Secondary | ICD-10-CM | POA: Diagnosis not present

## 2013-09-25 DIAGNOSIS — R7301 Impaired fasting glucose: Secondary | ICD-10-CM | POA: Diagnosis not present

## 2013-09-25 DIAGNOSIS — F411 Generalized anxiety disorder: Secondary | ICD-10-CM | POA: Diagnosis not present

## 2013-09-25 DIAGNOSIS — D518 Other vitamin B12 deficiency anemias: Secondary | ICD-10-CM | POA: Diagnosis not present

## 2013-09-25 DIAGNOSIS — Z23 Encounter for immunization: Secondary | ICD-10-CM | POA: Diagnosis not present

## 2013-09-25 DIAGNOSIS — F329 Major depressive disorder, single episode, unspecified: Secondary | ICD-10-CM | POA: Diagnosis not present

## 2013-09-25 DIAGNOSIS — IMO0002 Reserved for concepts with insufficient information to code with codable children: Secondary | ICD-10-CM | POA: Diagnosis not present

## 2013-09-25 DIAGNOSIS — Z1331 Encounter for screening for depression: Secondary | ICD-10-CM | POA: Diagnosis not present

## 2013-09-25 DIAGNOSIS — Z79899 Other long term (current) drug therapy: Secondary | ICD-10-CM | POA: Diagnosis not present

## 2013-09-25 DIAGNOSIS — C50919 Malignant neoplasm of unspecified site of unspecified female breast: Secondary | ICD-10-CM | POA: Diagnosis not present

## 2013-09-25 DIAGNOSIS — M1990 Unspecified osteoarthritis, unspecified site: Secondary | ICD-10-CM | POA: Diagnosis not present

## 2013-09-25 DIAGNOSIS — Z0000 Encounter for general adult medical examination without abnormal findings: Secondary | ICD-10-CM | POA: Diagnosis not present

## 2013-09-25 DIAGNOSIS — Z1212 Encounter for screening for malignant neoplasm of rectum: Secondary | ICD-10-CM | POA: Diagnosis not present

## 2013-10-30 ENCOUNTER — Ambulatory Visit
Admission: RE | Admit: 2013-10-30 | Discharge: 2013-10-30 | Disposition: A | Discharge status: Home / Self Care | Payer: Medicare Other | Source: Ambulatory Visit

## 2013-10-30 DIAGNOSIS — Z1231 Encounter for screening mammogram for malignant neoplasm of breast: Secondary | ICD-10-CM

## 2013-11-28 DIAGNOSIS — Z23 Encounter for immunization: Secondary | ICD-10-CM | POA: Diagnosis not present

## 2013-12-02 DIAGNOSIS — IMO0002 Reserved for concepts with insufficient information to code with codable children: Secondary | ICD-10-CM | POA: Diagnosis not present

## 2013-12-02 DIAGNOSIS — M542 Cervicalgia: Secondary | ICD-10-CM | POA: Diagnosis not present

## 2014-01-30 ENCOUNTER — Ambulatory Visit (HOSPITAL_BASED_OUTPATIENT_CLINIC_OR_DEPARTMENT_OTHER): Payer: Medicare Other | Admitting: Oncology

## 2014-01-30 ENCOUNTER — Telehealth: Payer: Self-pay | Admitting: *Deleted

## 2014-01-30 ENCOUNTER — Encounter: Payer: Self-pay | Admitting: Oncology

## 2014-01-30 ENCOUNTER — Other Ambulatory Visit (HOSPITAL_BASED_OUTPATIENT_CLINIC_OR_DEPARTMENT_OTHER): Payer: Medicare Other

## 2014-01-30 VITALS — BP 161/85 | HR 78 | Temp 98.0°F | Resp 20 | Ht 63.0 in | Wt 140.4 lb

## 2014-01-30 DIAGNOSIS — Z9221 Personal history of antineoplastic chemotherapy: Secondary | ICD-10-CM | POA: Diagnosis not present

## 2014-01-30 DIAGNOSIS — C50419 Malignant neoplasm of upper-outer quadrant of unspecified female breast: Secondary | ICD-10-CM

## 2014-01-30 DIAGNOSIS — Z923 Personal history of irradiation: Secondary | ICD-10-CM | POA: Diagnosis not present

## 2014-01-30 DIAGNOSIS — Z853 Personal history of malignant neoplasm of breast: Secondary | ICD-10-CM

## 2014-01-30 LAB — CBC WITH DIFFERENTIAL/PLATELET
BASO%: 0.3 % (ref 0.0–2.0)
Basophils Absolute: 0 10*3/uL (ref 0.0–0.1)
EOS ABS: 0.1 10*3/uL (ref 0.0–0.5)
EOS%: 1.5 % (ref 0.0–7.0)
HCT: 44.3 % (ref 34.8–46.6)
HGB: 14.6 g/dL (ref 11.6–15.9)
LYMPH#: 1.4 10*3/uL (ref 0.9–3.3)
LYMPH%: 23.5 % (ref 14.0–49.7)
MCH: 29.7 pg (ref 25.1–34.0)
MCHC: 33 g/dL (ref 31.5–36.0)
MCV: 90 fL (ref 79.5–101.0)
MONO#: 0.6 10*3/uL (ref 0.1–0.9)
MONO%: 9.7 % (ref 0.0–14.0)
NEUT%: 65 % (ref 38.4–76.8)
NEUTROS ABS: 3.8 10*3/uL (ref 1.5–6.5)
PLATELETS: 145 10*3/uL (ref 145–400)
RBC: 4.92 10*6/uL (ref 3.70–5.45)
RDW: 13.3 % (ref 11.2–14.5)
WBC: 5.9 10*3/uL (ref 3.9–10.3)

## 2014-01-30 LAB — COMPREHENSIVE METABOLIC PANEL (CC13)
ALBUMIN: 4.4 g/dL (ref 3.5–5.0)
ALT: 15 U/L (ref 0–55)
ANION GAP: 9 meq/L (ref 3–11)
AST: 18 U/L (ref 5–34)
Alkaline Phosphatase: 58 U/L (ref 40–150)
BUN: 20.2 mg/dL (ref 7.0–26.0)
CALCIUM: 10.5 mg/dL — AB (ref 8.4–10.4)
CHLORIDE: 104 meq/L (ref 98–109)
CO2: 25 meq/L (ref 22–29)
Creatinine: 0.8 mg/dL (ref 0.6–1.1)
GLUCOSE: 93 mg/dL (ref 70–140)
POTASSIUM: 4.4 meq/L (ref 3.5–5.1)
SODIUM: 139 meq/L (ref 136–145)
TOTAL PROTEIN: 7 g/dL (ref 6.4–8.3)
Total Bilirubin: 0.57 mg/dL (ref 0.20–1.20)

## 2014-01-30 NOTE — Progress Notes (Signed)
OFFICE PROGRESS NOTE  CC  Dr. 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  PRIOR THERAPY:  #1 patient underwent a lumpectomy in July 2003 for a 3.4 cm invasive ductal carcinoma that was ER positive PR positive HER-2/neu negative.  #2 she then received 6 cycles of adjuvant chemotherapy consisting of CMF followed by radiation therapy.  #3 she then received one year of tamoxifen that was discontinued on December 2004.  #4 she was then started on Arimidex 1 mg daily between 12/17/2003 to January 2010  [place]  INTERVAL HISTORY: Marie Alvarez 80 y.o. female returns for Yearly followup visit. Overall she is doing well she still continues to have significant amount of trouble with her back. She does have some degenerative disease. She otherwise denies any fevers chills night sweats headaches shortness of breath chest pains palpitations. She has not had any breast masses that she has noticed. Today she does get tenderness in the left breast. Remainder of the 10 point review of systems is negative.  MEDICAL HISTORY: Past Medical History  Diagnosis Date  . Cancer 2003    BREAST-LEFT  . Hypertension   . Macular degeneration   . Osteoporosis   . Hx estrogen therapy 02/02/2012  . Hyperlipidemia   . GERD (gastroesophageal reflux disease)   . Shingles   . Suburethral cyst 03/2012    Urologic evaluation recommended no special followup    ALLERGIES:  is allergic to sulfa antibiotics and prednisone.  MEDICATIONS:  Current Outpatient Prescriptions  Medication Sig Dispense Refill  . amLODipine (NORVASC) 5 MG tablet Take 2.5 mg by mouth 2 (two) times daily.       Marland Kitchen aspirin 81 MG tablet Take 81 mg by mouth daily.       Marland Kitchen CALCIUM PO Take by mouth daily.      . chlordiazePOXIDE (LIBRIUM) 5 MG capsule Take 5 mg by mouth daily as needed.      . Cholecalciferol (VITAMIN D-3 PO) Take by mouth.       . fish oil-omega-3 fatty acids 1000 MG capsule Take 2 g by mouth daily.      . Ganciclovir 0.15 % GEL Apply 1 application to eye 4 (four) times daily.  5 g  0  . HYDROcodone-acetaminophen (VICODIN) 5-500 MG per tablet Take 1 tablet by mouth every 8 (eight) hours as needed for pain.  20 tablet  0  . LISINOPRIL PO Take 10 mg by mouth daily.       . Multiple Vitamins-Minerals (PRESERVISION AREDS) CAPS Take by mouth daily.      Marland Kitchen NORTRIPTYLINE HCL PO Take 10 mg by mouth 2 (two) times daily.       . Pantoprazole Sodium (PROTONIX PO) Take 40 mg by mouth daily.       Marland Kitchen PRAVASTATIN SODIUM PO Take 40 mg by mouth daily.        No current facility-administered medications for this visit.    SURGICAL HISTORY:  Past Surgical History  Procedure Laterality Date  . Breast surgery  07-2002    LEFT LUMPECTOMY  . Tonsilectomy, adenoidectomy, bilateral myringotomy and tubes  1944  . Appendectomy  1946    REVIEW OF SYSTEMS:  Pertinent items are noted in HPI.   PHYSICAL EXAMINATION: General appearance: alert, cooperative and appears stated age Neck: no adenopathy, no carotid bruit, no JVD, supple, symmetrical, trachea midline and thyroid not enlarged, symmetric, no tenderness/mass/nodules Lymph nodes: Cervical, supraclavicular,  and axillary nodes normal. Resp: clear to auscultation bilaterally and normal percussion bilaterally Back: symmetric, no curvature. ROM normal. No CVA tenderness. Cardio: regular rate and rhythm, S1, S2 normal, no murmur, click, rub or gallop GI: soft, non-tender; bowel sounds normal; no masses,  no organomegaly Extremities: extremities normal, atraumatic, no cyanosis or edema Neurologic: Alert and oriented X 3, normal strength and tone. Normal symmetric reflexes. Normal coordination and gait  ECOG PERFORMANCE STATUS: 1 - Symptomatic but completely ambulatory  Blood pressure 161/85, pulse 78, temperature 98 F (36.7 C), temperature source Oral, resp. rate 20, height 5' 3" (1.6  m), weight 140 lb 6.4 oz (63.685 kg).  LABORATORY DATA: Lab Results  Component Value Date   WBC 5.9 01/30/2014   HGB 14.6 01/30/2014   HCT 44.3 01/30/2014   MCV 90.0 01/30/2014   PLT 145 01/30/2014      Chemistry      Component Value Date/Time   NA 139 01/30/2014 1021   NA 139 02/02/2012 1159   NA 138 02/02/2010 1015   K 4.4 01/30/2014 1021   K 4.4 02/02/2012 1159   K 4.0 02/02/2010 1015   CL 104 01/31/2013 1358   CL 104 02/02/2012 1159   CL 99 02/02/2010 1015   CO2 25 01/30/2014 1021   CO2 26 02/02/2012 1159   CO2 31 02/02/2010 1015   BUN 20.2 01/30/2014 1021   BUN 15 02/02/2012 1159   BUN 12 02/02/2010 1015   CREATININE 0.8 01/30/2014 1021   CREATININE 0.70 02/02/2012 1159   CREATININE 0.7 02/02/2010 1015      Component Value Date/Time   CALCIUM 10.5* 01/30/2014 1021   CALCIUM 10.4 02/02/2012 1159   CALCIUM 10.0 02/02/2010 1015   ALKPHOS 58 01/30/2014 1021   ALKPHOS 45 02/02/2012 1159   ALKPHOS 63 02/02/2010 1015   AST 18 01/30/2014 1021   AST 20 02/02/2012 1159   AST 20 02/02/2010 1015   ALT 15 01/30/2014 1021   ALT 13 02/02/2012 1159   ALT 14 02/02/2010 1015   BILITOT 0.57 01/30/2014 1021   BILITOT 0.4 02/02/2012 1159   BILITOT 0.90 02/02/2010 1015       RADIOGRAPHIC STUDIES:  No results found.  ASSESSMENT/PLAN: 80 year old female with previous history of stage I invasive ductal carcinoma that was ER positive PR positive HER-2/neu negative originally measuring 3.4 cm diagnosed July 2003. She received lumpectomy followed by chemotherapy consisting of CMF CMF overall she tolerated this well. This was then followed by radiation therapy to the left breast. She received Arimidex from January 2005 to January 2010. She is without evidence of recurrent disease.   PLAN:  We will continue to follow the patient on a yearly basis.   All questions were answered. The patient knows to call the clinic with any problems, questions or concerns. We can certainly see the patient much sooner if  necessary.  I spent 15 minutes counseling the patient face to face. The total time spent in the appointment was  minutes.    Marcy Panning, MD Medical/Oncology [facility] [phone] (beeper) [phone] (Office)  01/30/2014, 11:03 AM

## 2014-01-30 NOTE — Telephone Encounter (Signed)
appts made and printed...td 

## 2014-02-12 ENCOUNTER — Encounter: Payer: Self-pay | Admitting: Gynecology

## 2014-02-12 ENCOUNTER — Ambulatory Visit (INDEPENDENT_AMBULATORY_CARE_PROVIDER_SITE_OTHER): Payer: Medicare Other | Admitting: Gynecology

## 2014-02-12 VITALS — BP 124/74 | Ht 62.5 in | Wt 141.0 lb

## 2014-02-12 DIAGNOSIS — C50919 Malignant neoplasm of unspecified site of unspecified female breast: Secondary | ICD-10-CM | POA: Diagnosis not present

## 2014-02-12 DIAGNOSIS — N368 Other specified disorders of urethra: Secondary | ICD-10-CM | POA: Diagnosis not present

## 2014-02-12 DIAGNOSIS — N952 Postmenopausal atrophic vaginitis: Secondary | ICD-10-CM

## 2014-02-12 NOTE — Patient Instructions (Signed)
Follow up in one year for annual exam 

## 2014-02-12 NOTE — Progress Notes (Signed)
Marie Alvarez Dec 27, 1930 [id number]        80 y.o.  [id number] for followup exam.  Several issues noted below.  Past medical history,surgical history, problem list, medications, allergies, family history and social history were all reviewed and documented in the EPIC chart.  ROS:  Performed and pertinent positives and negatives are included in the history, assessment and plan .  Exam: Kim assistant Filed Vitals:   02/12/14 1456  BP: 124/74  Height: 5' 2.5" (1.588 m)  Weight: 141 lb (63.957 kg)   General appearance  Normal Skin grossly normal Head/Neck normal with no cervical or supraclavicular adenopathy thyroid normal Lungs  clear Cardiac RR, without RMG Abdominal  soft, nontender, without masses, organomegaly or hernia Breasts  examined lying and sitting without masses, retractions, discharge or axillary adenopathy. Well-healed left lumpectomy scar with mild breast distortion stable over time. Pelvic  Ext/BUS/vagina generalized atrophic changes. 1.5 cm cystic area of mid anterior vaginal wall 2 fingerbreadths within the introitus.  Cervix atrophic  Uterus anteverted, normal size, shape and contour, midline and mobile nontender   Adnexa  Without masses or tenderness    Anus and perineum  Normal   Rectovaginal  Normal sphincter tone without palpated masses or tenderness.    Assessment/Plan:  80 y.o. [id number] female for annual exam.   1. Suburethral midline cystic area. Stable from exam last year. Saw Dr. Janice Norrie by history who recommended just to follow the area with no intervention needed. Patient is asymptomatic. Options to continue to monitor with annual exams or followup with urology one more time discussed. Patient asked about seeing Dr. Rosana Hoes who used to take care of her husband and we'll see if we can help her make this arrangement. If he is not available then we will offer alternative physician at her choice. Otherwise doing well without significant vaginal dryness hot flashes night  sweats. No vaginal bleeding. Continue to monitor. 2. Mammography 10/2013. History of breast cancer status post left lumpectomy. Exam NED. Continue with annual mammography and SBE. 3. Pap smear 2012. No Pap smear done today. No history of abnormal Pap smears previously. Reviewed current screening guidelines and she is over the age of 60 and we agreed both to stop screening and she is comfortable with this. 4. Colonoscopy 12 years ago.  Per her history they did not recommend further screening. We'll continue to follow with Dr. Joylene Draft in reference to these recommendations. 5. Osteoporosis historically. Being followed by Dr. Joylene Draft. At no access to the bone densities he she'll continue to followup with Dr. Joylene Draft in reference to this. Increase calcium vitamin D reviewed. 6. Health maintenance. No blood work done as it is all done through her primary physician's office. Followup one year, sooner as needed.   Note: This document was prepared with digital dictation and possible smart phrase technology. Any transcriptional errors that result from this process are unintentional.   [name] MD, 3:34 PM 02/12/2014

## 2014-02-13 ENCOUNTER — Telehealth: Payer: Self-pay | Admitting: *Deleted

## 2014-02-13 NOTE — Telephone Encounter (Signed)
Appointment on 03/03/14 @ 10:00 am with Dr.Davis at [facility] location pt given address and # to call if needed. Notes faxed.

## 2014-02-13 NOTE — Telephone Encounter (Signed)
Message copied by Thamas Jaegers on Thu Feb 13, 2014  9:03 AM ------      Message from: [name]      Created: Wed Feb 12, 2014  4:34 PM       And has a probable suburethral cyst and asked if urologist Dr. Lawerance Bach could see her. My impression is that he does come to [place] from [place] and has office appointments here. If possible could we see if we can make an appointment for her to see him at his [facility] location. ------

## 2014-03-03 DIAGNOSIS — N368 Other specified disorders of urethra: Secondary | ICD-10-CM | POA: Insufficient documentation

## 2014-04-23 DIAGNOSIS — H04129 Dry eye syndrome of unspecified lacrimal gland: Secondary | ICD-10-CM | POA: Diagnosis not present

## 2014-04-23 DIAGNOSIS — H1045 Other chronic allergic conjunctivitis: Secondary | ICD-10-CM | POA: Diagnosis not present

## 2014-04-23 DIAGNOSIS — H02409 Unspecified ptosis of unspecified eyelid: Secondary | ICD-10-CM | POA: Diagnosis not present

## 2014-04-23 DIAGNOSIS — Z961 Presence of intraocular lens: Secondary | ICD-10-CM | POA: Diagnosis not present

## 2014-06-02 DIAGNOSIS — N368 Other specified disorders of urethra: Secondary | ICD-10-CM | POA: Diagnosis not present

## 2014-06-02 DIAGNOSIS — N952 Postmenopausal atrophic vaginitis: Secondary | ICD-10-CM | POA: Diagnosis not present

## 2014-06-25 DIAGNOSIS — H6120 Impacted cerumen, unspecified ear: Secondary | ICD-10-CM | POA: Diagnosis not present

## 2014-06-25 DIAGNOSIS — H60509 Unspecified acute noninfective otitis externa, unspecified ear: Secondary | ICD-10-CM | POA: Diagnosis not present

## 2014-09-24 ENCOUNTER — Other Ambulatory Visit: Payer: Self-pay

## 2014-09-24 DIAGNOSIS — Z853 Personal history of malignant neoplasm of breast: Secondary | ICD-10-CM

## 2014-09-24 DIAGNOSIS — Z1231 Encounter for screening mammogram for malignant neoplasm of breast: Secondary | ICD-10-CM

## 2014-09-25 ENCOUNTER — Telehealth: Payer: Self-pay | Admitting: Hematology and Oncology

## 2014-09-25 NOTE — Telephone Encounter (Signed)
Lvm advising appt chg from 2/19 (md pal) to 2/26 @ 10am. Mailed revised appt calendar.

## 2014-10-10 DIAGNOSIS — Z23 Encounter for immunization: Secondary | ICD-10-CM | POA: Diagnosis not present

## 2014-10-13 ENCOUNTER — Encounter: Payer: Self-pay | Admitting: Gynecology

## 2014-10-16 DIAGNOSIS — R7301 Impaired fasting glucose: Secondary | ICD-10-CM | POA: Diagnosis not present

## 2014-10-16 DIAGNOSIS — Z008 Encounter for other general examination: Secondary | ICD-10-CM | POA: Diagnosis not present

## 2014-10-16 DIAGNOSIS — E785 Hyperlipidemia, unspecified: Secondary | ICD-10-CM | POA: Diagnosis not present

## 2014-10-16 DIAGNOSIS — E559 Vitamin D deficiency, unspecified: Secondary | ICD-10-CM | POA: Diagnosis not present

## 2014-10-16 DIAGNOSIS — E538 Deficiency of other specified B group vitamins: Secondary | ICD-10-CM | POA: Diagnosis not present

## 2014-10-16 DIAGNOSIS — R8299 Other abnormal findings in urine: Secondary | ICD-10-CM | POA: Diagnosis not present

## 2014-10-23 DIAGNOSIS — M545 Low back pain: Secondary | ICD-10-CM | POA: Diagnosis not present

## 2014-10-23 DIAGNOSIS — R7301 Impaired fasting glucose: Secondary | ICD-10-CM | POA: Diagnosis not present

## 2014-10-23 DIAGNOSIS — C50919 Malignant neoplasm of unspecified site of unspecified female breast: Secondary | ICD-10-CM | POA: Diagnosis not present

## 2014-10-23 DIAGNOSIS — Z0000 Encounter for general adult medical examination without abnormal findings: Secondary | ICD-10-CM | POA: Diagnosis not present

## 2014-10-23 DIAGNOSIS — M1990 Unspecified osteoarthritis, unspecified site: Secondary | ICD-10-CM | POA: Diagnosis not present

## 2014-10-23 DIAGNOSIS — Z1389 Encounter for screening for other disorder: Secondary | ICD-10-CM | POA: Diagnosis not present

## 2014-10-23 DIAGNOSIS — D519 Vitamin B12 deficiency anemia, unspecified: Secondary | ICD-10-CM | POA: Diagnosis not present

## 2014-10-23 DIAGNOSIS — M5380 Other specified dorsopathies, site unspecified: Secondary | ICD-10-CM | POA: Diagnosis not present

## 2014-10-23 DIAGNOSIS — F419 Anxiety disorder, unspecified: Secondary | ICD-10-CM | POA: Diagnosis not present

## 2014-10-27 DIAGNOSIS — Z1212 Encounter for screening for malignant neoplasm of rectum: Secondary | ICD-10-CM | POA: Diagnosis not present

## 2014-10-31 ENCOUNTER — Encounter (INDEPENDENT_AMBULATORY_CARE_PROVIDER_SITE_OTHER): Payer: Self-pay

## 2014-10-31 ENCOUNTER — Ambulatory Visit
Admission: RE | Admit: 2014-10-31 | Discharge: 2014-10-31 | Disposition: A | Discharge status: Home / Self Care | Payer: Medicare Other | Source: Ambulatory Visit

## 2014-10-31 DIAGNOSIS — Z1231 Encounter for screening mammogram for malignant neoplasm of breast: Secondary | ICD-10-CM

## 2014-10-31 DIAGNOSIS — Z853 Personal history of malignant neoplasm of breast: Secondary | ICD-10-CM

## 2014-11-05 DIAGNOSIS — M859 Disorder of bone density and structure, unspecified: Secondary | ICD-10-CM | POA: Diagnosis not present

## 2014-12-01 DIAGNOSIS — N362 Urethral caruncle: Secondary | ICD-10-CM | POA: Insufficient documentation

## 2015-01-30 ENCOUNTER — Ambulatory Visit: Payer: Medicare Other | Admitting: Hematology and Oncology

## 2015-01-30 ENCOUNTER — Other Ambulatory Visit: Payer: Medicare Other

## 2015-02-05 ENCOUNTER — Other Ambulatory Visit: Payer: Self-pay | Admitting: *Deleted

## 2015-02-05 DIAGNOSIS — C50419 Malignant neoplasm of upper-outer quadrant of unspecified female breast: Secondary | ICD-10-CM

## 2015-02-06 ENCOUNTER — Ambulatory Visit (HOSPITAL_BASED_OUTPATIENT_CLINIC_OR_DEPARTMENT_OTHER): Payer: Medicare Other | Admitting: Hematology and Oncology

## 2015-02-06 ENCOUNTER — Telehealth: Payer: Self-pay | Admitting: Hematology and Oncology

## 2015-02-06 ENCOUNTER — Other Ambulatory Visit (HOSPITAL_BASED_OUTPATIENT_CLINIC_OR_DEPARTMENT_OTHER): Payer: Medicare Other

## 2015-02-06 DIAGNOSIS — Z853 Personal history of malignant neoplasm of breast: Secondary | ICD-10-CM | POA: Diagnosis not present

## 2015-02-06 DIAGNOSIS — C50419 Malignant neoplasm of upper-outer quadrant of unspecified female breast: Secondary | ICD-10-CM

## 2015-02-06 DIAGNOSIS — C50412 Malignant neoplasm of upper-outer quadrant of left female breast: Secondary | ICD-10-CM

## 2015-02-06 LAB — COMPREHENSIVE METABOLIC PANEL (CC13)
ALT: 8 U/L (ref 0–55)
ANION GAP: 10 meq/L (ref 3–11)
AST: 14 U/L (ref 5–34)
Albumin: 4.2 g/dL (ref 3.5–5.0)
Alkaline Phosphatase: 71 U/L (ref 40–150)
BILIRUBIN TOTAL: 0.61 mg/dL (ref 0.20–1.20)
BUN: 14.6 mg/dL (ref 7.0–26.0)
CALCIUM: 10.4 mg/dL (ref 8.4–10.4)
CHLORIDE: 104 meq/L (ref 98–109)
CO2: 28 meq/L (ref 22–29)
CREATININE: 0.8 mg/dL (ref 0.6–1.1)
EGFR: 74 mL/min/{1.73_m2} — AB (ref >90)
GLUCOSE: 87 mg/dL (ref 70–140)
Potassium: 4.3 mEq/L (ref 3.5–5.1)
Sodium: 142 mEq/L (ref 136–145)
Total Protein: 7.1 g/dL (ref 6.4–8.3)

## 2015-02-06 LAB — CBC WITH DIFFERENTIAL/PLATELET
BASO%: 0.6 % (ref 0.0–2.0)
Basophils Absolute: 0 10*3/uL (ref 0.0–0.1)
EOS%: 1.8 % (ref 0.0–7.0)
Eosinophils Absolute: 0.1 10*3/uL (ref 0.0–0.5)
HEMATOCRIT: 46 % (ref 34.8–46.6)
HGB: 14.8 g/dL (ref 11.6–15.9)
LYMPH%: 18.8 % (ref 14.0–49.7)
MCH: 29.3 pg (ref 25.1–34.0)
MCHC: 32.2 g/dL (ref 31.5–36.0)
MCV: 90.9 fL (ref 79.5–101.0)
MONO#: 0.4 10*3/uL (ref 0.1–0.9)
MONO%: 6.8 % (ref 0.0–14.0)
NEUT#: 3.9 10*3/uL (ref 1.5–6.5)
NEUT%: 72 % (ref 38.4–76.8)
PLATELETS: 155 10*3/uL (ref 145–400)
RBC: 5.06 10*6/uL (ref 3.70–5.45)
RDW: 13.9 % (ref 11.2–14.5)
WBC: 5.4 10*3/uL (ref 3.9–10.3)
lymph#: 1 10*3/uL (ref 0.9–3.3)

## 2015-02-06 NOTE — Assessment & Plan Note (Signed)
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 Physical examination of the breast 02/06/2015 is normal 2. Mammogram 10/31/2014 is normal  I recommended graduating her to survivorship clinic. She will be followed with Marie Alvarez next year for follow-up.

## 2015-02-06 NOTE — Progress Notes (Signed)
Patient Care Team: Jerlyn Ly, MD as PCP - General (Internal Medicine)  DIAGNOSIS: No matching staging information was found for the patient.  SUMMARY OF ONCOLOGIC HISTORY:   Malignant neoplasm of upper-outer quadrant of female breast   07/01/2002 Surgery Left breast lumpectomy, stage 2 invasive ductal carcinoma with DCIS ER positive PR positive HER-2 -3.4 cm tumor T2 N0 M0   08/06/2002 - 11/06/2002 Chemotherapy Adjuvant chemotherapy with CMF 6   01/29/2003 - 01/06/2009 Anti-estrogen oral therapy Tamoxifen for 1 year then Arimidex 1 mg daily   12/06/2012 - 12/31/2012 Radiation Therapy Adjuvant radiation therapy    CHIEF COMPLIANT: Follow-up of breast cancer  INTERVAL HISTORY: Marie Alvarez is a 81 year old lady with above-mentioned history of left breast cancer treated with lumpectomy followed by adjuvant chemotherapy and tamoxifen for 1 year followed by Arimidex for 7 years completed therapy in 2010. She is doing extremely well without any problems or concerns. 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 Her husband passed away 5 years ago and she is still feeling the lack of companionship.  REVIEW OF SYSTEMS:   Constitutional: Denies fevers, chills or abnormal weight loss Eyes: Denies blurriness of vision Ears, nose, mouth, throat, and face: Denies mucositis or sore throat Respiratory: Denies cough, dyspnea or wheezes Cardiovascular: Denies palpitation, chest discomfort or lower extremity swelling Gastrointestinal:  Denies nausea, heartburn or change in bowel habits Skin: Denies abnormal skin rashes Lymphatics: Denies new lymphadenopathy or easy bruising Neurological:Denies numbness, tingling or new weaknesses Behavioral/Psych: Mood is stable, no new changes  Breast:  denies any pain or lumps or nodules in either breasts All other systems were reviewed with the patient and are negative.  I have reviewed the past medical history, past surgical  history, social history and family history with the patient and they are unchanged from previous note.  ALLERGIES:  is allergic to sulfa antibiotics and prednisone.  MEDICATIONS:  Current Outpatient Prescriptions  Medication Sig Dispense Refill  . amLODipine (NORVASC) 5 MG tablet Take 2.5 mg by mouth 2 (two) times daily.     Marland Kitchen aspirin 81 MG tablet Take 81 mg by mouth daily.     Marland Kitchen CALCIUM PO Take by mouth daily.    . Cholecalciferol (VITAMIN D-3 PO) Take by mouth.    . fish oil-omega-3 fatty acids 1000 MG capsule Take 2 g by mouth daily.    . Ganciclovir 0.15 % GEL Apply 1 application to eye 4 (four) times daily. 5 g 0  . irbesartan (AVAPRO) 150 MG tablet Take 150 mg by mouth daily.    . Multiple Vitamins-Minerals (PRESERVISION AREDS) CAPS Take by mouth daily.    Marland Kitchen NORTRIPTYLINE HCL PO Take 10 mg by mouth 2 (two) times daily.     . Pantoprazole Sodium (PROTONIX PO) Take 40 mg by mouth daily.     Marland Kitchen PRAVASTATIN SODIUM PO Take 40 mg by mouth daily.      No current facility-administered medications for this visit.    PHYSICAL EXAMINATION: ECOG PERFORMANCE STATUS: 0 - Asymptomatic  Filed Vitals:   02/06/15 0952  BP: 167/67  Pulse: 80  Temp: 98 F (36.7 C)  Resp: 18   Filed Weights   02/06/15 0952  Weight: 137 lb 11.2 oz (62.46 kg)    GENERAL:alert, no distress and comfortable SKIN: skin color, texture, turgor are normal, no rashes or significant lesions EYES: normal, Conjunctiva are pink and non-injected, sclera clear OROPHARYNX:no exudate, no erythema and lips, buccal mucosa, and tongue  normal  NECK: supple, thyroid normal size, non-tender, without nodularity LYMPH:  no palpable lymphadenopathy in the cervical, axillary or inguinal LUNGS: clear to auscultation and percussion with normal breathing effort HEART: regular rate & rhythm and no murmurs and no lower extremity edema ABDOMEN:abdomen soft, non-tender and normal bowel sounds Musculoskeletal:no cyanosis of digits and no  clubbing  NEURO: alert & oriented x 3 with fluent speech, no focal motor/sensory deficits BREAST: No palpable masses or nodules in either right or left breasts. No palpable axillary supraclavicular or infraclavicular adenopathy no breast tenderness or nipple discharge. (exam performed in the presence of a chaperone)  LABORATORY DATA:  I have reviewed the data as listed   Chemistry      Component Value Date/Time   NA 142 02/06/2015 0928   NA 139 02/02/2012 1159   NA 138 02/02/2010 1015   K 4.3 02/06/2015 0928   K 4.4 02/02/2012 1159   K 4.0 02/02/2010 1015   CL 104 01/31/2013 1358   CL 104 02/02/2012 1159   CL 99 02/02/2010 1015   CO2 28 02/06/2015 0928   CO2 26 02/02/2012 1159   CO2 31 02/02/2010 1015   BUN 14.6 02/06/2015 0928   BUN 15 02/02/2012 1159   BUN 12 02/02/2010 1015   CREATININE 0.8 02/06/2015 0928   CREATININE 0.70 02/02/2012 1159   CREATININE 0.7 02/02/2010 1015      Component Value Date/Time   CALCIUM 10.4 02/06/2015 0928   CALCIUM 10.4 02/02/2012 1159   CALCIUM 10.0 02/02/2010 1015   ALKPHOS 71 02/06/2015 0928   ALKPHOS 45 02/02/2012 1159   ALKPHOS 63 02/02/2010 1015   AST 14 02/06/2015 0928   AST 20 02/02/2012 1159   AST 20 02/02/2010 1015   ALT 8 02/06/2015 0928   ALT 13 02/02/2012 1159   ALT 14 02/02/2010 1015   BILITOT 0.61 02/06/2015 0928   BILITOT 0.4 02/02/2012 1159   BILITOT 0.90 02/02/2010 1015       Lab Results  Component Value Date   WBC 5.4 02/06/2015   HGB 14.8 02/06/2015   HCT 46.0 02/06/2015   MCV 90.9 02/06/2015   PLT 155 02/06/2015   NEUTROABS 3.9 02/06/2015     RADIOGRAPHIC STUDIES: I have personally reviewed the radiology reports and agreed with their findings.   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 Physical examination of the breast 02/06/2015 is normal 2. Mammogram 10/31/2014 is normal  I recommended graduating her to survivorship clinic. She will be followed with Elzie Rings next year for follow-up.     Orders Placed This Encounter  Procedures  . CBC with Differential    Standing Status: Future     Number of Occurrences:      Standing Expiration Date: 02/06/2016  . Comprehensive metabolic panel (Cmet) - [facility]    Standing Status: Future     Number of Occurrences:      Standing Expiration Date: 02/06/2016   The patient has a good understanding of the overall plan. she agrees with it. She will call with any problems that may develop before her next visit here.   Rulon Eisenmenger, MD

## 2015-02-06 NOTE — Telephone Encounter (Signed)
per pof to sch w/Gretchen [place] email to sch and call pt w/appt time & date

## 2015-02-16 ENCOUNTER — Encounter: Payer: Medicare Other | Admitting: Gynecology

## 2015-02-18 ENCOUNTER — Encounter: Payer: Self-pay | Admitting: Gynecology

## 2015-02-18 ENCOUNTER — Ambulatory Visit (INDEPENDENT_AMBULATORY_CARE_PROVIDER_SITE_OTHER): Payer: Medicare Other | Admitting: Gynecology

## 2015-02-18 VITALS — BP 124/78 | Ht 63.0 in | Wt 138.0 lb

## 2015-02-18 DIAGNOSIS — M810 Age-related osteoporosis without current pathological fracture: Secondary | ICD-10-CM | POA: Diagnosis not present

## 2015-02-18 DIAGNOSIS — Z01419 Encounter for gynecological examination (general) (routine) without abnormal findings: Secondary | ICD-10-CM

## 2015-02-18 DIAGNOSIS — N398 Other specified disorders of urinary system: Secondary | ICD-10-CM

## 2015-02-18 DIAGNOSIS — N952 Postmenopausal atrophic vaginitis: Secondary | ICD-10-CM

## 2015-02-18 DIAGNOSIS — R8299 Other abnormal findings in urine: Secondary | ICD-10-CM | POA: Diagnosis not present

## 2015-02-18 DIAGNOSIS — N368 Other specified disorders of urethra: Secondary | ICD-10-CM

## 2015-02-18 NOTE — Progress Notes (Signed)
Marie Alvarez Nov 25, 1931 [id number]        81 y.o.  [id number] for breast and pelvic exam. Several issues noted below.  Past medical history,surgical history, problem list, medications, allergies, family history and social history were all reviewed and documented as reviewed in the EPIC chart.  ROS:  Performed with pertinent positives and negatives included in the history, assessment and plan.   Additional significant findings :  none   Exam: Marie Alvarez Vitals:   02/18/15 1049  BP: 124/78  Height: 5\' 3"  (1.6 m)  Weight: 138 lb (62.596 kg)   General appearance:  Normal affect, orientation and appearance. Skin: Grossly normal HEENT: Without gross lesions.  No cervical or supraclavicular adenopathy. Thyroid normal.  Lungs:  Clear without wheezing, rales or rhonchi Cardiac: RR, without RMG Abdominal:  Soft, nontender, without masses, guarding, rebound, organomegaly or hernia Breasts:  Examined lying and sitting. Right without masses, retractions, discharge or axillary adenopathy.  Left well-healed left lumpectomy scars with mild breast distortion, stable over time. No masses, discharge or adenopathy. Pelvic:  Ext/BUS/vagina with atrophic changes. 1.5 cm cystic area mid anterior vaginal wall to bring the breast from the introitus, nontender.  Cervix atrophic flush with upper vagina difficult to visualize  Uterus grossly normal size midline mobile nontender. Difficult to palpate   Adnexa  Without masses or tenderness    Anus and perineum  Normal   Rectovaginal  Normal sphincter tone without palpated masses or tenderness.    Assessment/Plan:  81 y.o. [name] female for breast and pelvic exam.   1. Suburethral midline cyst. Stable on serial exams. Asymptomatic to the patient. Evaluated by urology who recommended no intervention. Continue to monitor with annual exams.  Check urinalysis. 2. History of left breast cancer status post lumpectomy. Exam NED. Mammography 10/2014. Continue  with annual mammography. SBE monthly reviewed. 3. Postmenopausal/atrophic genital changes. Patient without significant symptoms of hot flushes, night sweats, vaginal dryness. No vaginal bleeding. Continue to monitor and report any vaginal bleeding. 4. Pap smear 2012. No Pap smear done today. No history of significant abnormal Pap smears. We both agree to stop screening per current screening guidelines as she is over the age of 30. 80. Colonoscopy 13 years ago. Their recommendation is to stop screening and she is comfortable with this. 6. Osteoporosis historically. Followed by Dr. Joylene Draft. I have no copies of her studies and she'll continue to follow up with him in reference to this. 7. Health maintenance. No routine blood work done as she reports this done at her primary physician's office. Follow up in one year, sooner as needed.     Marie Auerbach MD, 11:17 AM 02/18/2015

## 2015-02-18 NOTE — Patient Instructions (Signed)
You may obtain a copy of any labs that were done today by logging onto MyChart as outlined in the instructions provided with your AVS (after visit summary). The office will not call with normal lab results but certainly if there are any significant abnormalities then we will contact you.   Health Maintenance, Female A healthy lifestyle and preventative care can promote health and wellness.  Maintain regular health, dental, and eye exams.  Eat a healthy diet. Foods like vegetables, fruits, whole grains, low-fat dairy products, and lean protein foods contain the nutrients you need without too many calories. Decrease your intake of foods high in solid fats, added sugars, and salt. Get information about a proper diet from your caregiver, if necessary.  Regular physical exercise is one of the most important things you can do for your health. Most adults should get at least 150 minutes of moderate-intensity exercise (any activity that increases your heart rate and causes you to sweat) each week. In addition, most adults need muscle-strengthening exercises on 2 or more days a week.   Maintain a healthy weight. The body mass index (BMI) is a screening tool to identify possible weight problems. It provides an estimate of body fat based on height and weight. Your caregiver can help determine your BMI, and can help you achieve or maintain a healthy weight. For adults 20 years and older:  A BMI below 18.5 is considered underweight.  A BMI of 18.5 to 24.9 is normal.  A BMI of 25 to 29.9 is considered overweight.  A BMI of 30 and above is considered obese.  Maintain normal blood lipids and cholesterol by exercising and minimizing your intake of saturated fat. Eat a balanced diet with plenty of fruits and vegetables. Blood tests for lipids and cholesterol should begin at age 61 and be repeated every 5 years. If your lipid or cholesterol levels are high, you are over 50, or you are a high risk for heart  disease, you may need your cholesterol levels checked more frequently.Ongoing high lipid and cholesterol levels should be treated with medicines if diet and exercise are not effective.  If you smoke, find out from your caregiver how to quit. If you do not use tobacco, do not start.  Lung cancer screening is recommended for adults aged 33 80 years who are at high risk for developing lung cancer because of a history of smoking. Yearly low-dose computed tomography (CT) is recommended for people who have at least a 30-pack-year history of smoking and are a current smoker or have quit within the past 15 years. A pack year of smoking is smoking an average of 1 pack of cigarettes a day for 1 year (for example: 1 pack a day for 30 years or 2 packs a day for 15 years). Yearly screening should continue until the smoker has stopped smoking for at least 15 years. Yearly screening should also be stopped for people who develop a health problem that would prevent them from having lung cancer treatment.  If you are pregnant, do not drink alcohol. If you are breastfeeding, be very cautious about drinking alcohol. If you are not pregnant and choose to drink alcohol, do not exceed 1 drink per day. One drink is considered to be 12 ounces (355 mL) of beer, 5 ounces (148 mL) of wine, or 1.5 ounces (44 mL) of liquor.  Avoid use of street drugs. Do not share needles with anyone. Ask for help if you need support or instructions about stopping  the use of drugs.  High blood pressure causes heart disease and increases the risk of stroke. Blood pressure should be checked at least every 1 to 2 years. Ongoing high blood pressure should be treated with medicines, if weight loss and exercise are not effective.  If you are 59 to 79 years old, ask your caregiver if you should take aspirin to prevent strokes.  Diabetes screening involves taking a blood sample to check your fasting blood sugar level. This should be done once every 3  years, after age 91, if you are within normal weight and without risk factors for diabetes. Testing should be considered at a younger age or be carried out more frequently if you are overweight and have at least 1 risk factor for diabetes.  Breast cancer screening is essential preventative care for women. You should practice "breast self-awareness." This means understanding the normal appearance and feel of your breasts and may include breast self-examination. Any changes detected, no matter how small, should be reported to a caregiver. Women in their 66s and 30s should have a clinical breast exam (CBE) by a caregiver as part of a regular health exam every 1 to 3 years. After age 101, women should have a CBE every year. Starting at age 100, women should consider having a mammogram (breast X-ray) every year. Women who have a family history of breast cancer should talk to their caregiver about genetic screening. Women at a high risk of breast cancer should talk to their caregiver about having an MRI and a mammogram every year.  Breast cancer gene (BRCA)-related cancer risk assessment is recommended for women who have family members with BRCA-related cancers. BRCA-related cancers include breast, ovarian, tubal, and peritoneal cancers. Having family members with these cancers may be associated with an increased risk for harmful changes (mutations) in the breast cancer genes BRCA1 and BRCA2. Results of the assessment will determine the need for genetic counseling and BRCA1 and BRCA2 testing.  The Pap test is a screening test for cervical cancer. Women should have a Pap test starting at age 57. Between ages 25 and 35, Pap tests should be repeated every 2 years. Beginning at age 37, you should have a Pap test every 3 years as long as the past 3 Pap tests have been normal. If you had a hysterectomy for a problem that was not cancer or a condition that could lead to cancer, then you no longer need Pap tests. If you are  between ages 50 and 76, and you have had normal Pap tests going back 10 years, you no longer need Pap tests. If you have had past treatment for cervical cancer or a condition that could lead to cancer, you need Pap tests and screening for cancer for at least 20 years after your treatment. If Pap tests have been discontinued, risk factors (such as a new sexual partner) need to be reassessed to determine if screening should be resumed. Some women have medical problems that increase the chance of getting cervical cancer. In these cases, your caregiver may recommend more frequent screening and Pap tests.  The human papillomavirus (HPV) test is an additional test that may be used for cervical cancer screening. The HPV test looks for the virus that can cause the cell changes on the cervix. The cells collected during the Pap test can be tested for HPV. The HPV test could be used to screen women aged 44 years and older, and should be used in women of any age  who have unclear Pap test results. After the age of 55, women should have HPV testing at the same frequency as a Pap test.  Colorectal cancer can be detected and often prevented. Most routine colorectal cancer screening begins at the age of 44 and continues through age 20. However, your caregiver may recommend screening at an earlier age if you have risk factors for colon cancer. On a yearly basis, your caregiver may provide home test kits to check for hidden blood in the stool. Use of a small camera at the end of a tube, to directly examine the colon (sigmoidoscopy or colonoscopy), can detect the earliest forms of colorectal cancer. Talk to your caregiver about this at age 86, when routine screening begins. Direct examination of the colon should be repeated every 5 to 10 years through age 13, unless early forms of pre-cancerous polyps or small growths are found.  Hepatitis C blood testing is recommended for all people born from [birth year] through 1963 and any  individual with known risks for hepatitis C.  Practice safe sex. Use condoms and avoid high-risk sexual practices to reduce the spread of sexually transmitted infections (STIs). Sexually active women aged 36 and younger should be checked for Chlamydia, which is a common sexually transmitted infection. Older women with new or multiple partners should also be tested for Chlamydia. Testing for other STIs is recommended if you are sexually active and at increased risk.  Osteoporosis is a disease in which the bones lose minerals and strength with aging. This can result in serious bone fractures. The risk of osteoporosis can be identified using a bone density scan. Women ages 20 and over and women at risk for fractures or osteoporosis should discuss screening with their caregivers. Ask your caregiver whether you should be taking a calcium supplement or vitamin D to reduce the rate of osteoporosis.  Menopause can be associated with physical symptoms and risks. Hormone replacement therapy is available to decrease symptoms and risks. You should talk to your caregiver about whether hormone replacement therapy is right for you.  Use sunscreen. Apply sunscreen liberally and repeatedly throughout the day. You should seek shade when your shadow is shorter than you. Protect yourself by wearing long sleeves, pants, a wide-brimmed hat, and sunglasses year round, whenever you are outdoors.  Notify your caregiver of new moles or changes in moles, especially if there is a change in shape or color. Also notify your caregiver if a mole is larger than the size of a pencil eraser.  Stay current with your immunizations. Document Released: 06/13/2011 Document Revised: 03/25/2013 Document Reviewed: 06/13/2011 [facility] Patient Information 2014 [employer].

## 2015-02-19 LAB — URINALYSIS W MICROSCOPIC + REFLEX CULTURE
BILIRUBIN URINE: NEGATIVE
Bacteria, UA: NONE SEEN
CASTS: NONE SEEN
Crystals: NONE SEEN
GLUCOSE, UA: NEGATIVE mg/dL
Hgb urine dipstick: NEGATIVE
Ketones, ur: NEGATIVE mg/dL
Nitrite: NEGATIVE
PH: 6 (ref 5.0–8.0)
Protein, ur: NEGATIVE mg/dL
SPECIFIC GRAVITY, URINE: 1.008 (ref 1.005–1.030)
SQUAMOUS EPITHELIAL / LPF: NONE SEEN
Urobilinogen, UA: 0.2 mg/dL (ref 0.0–1.0)

## 2015-02-20 LAB — URINE CULTURE: Colony Count: 35000

## 2015-09-09 DIAGNOSIS — H6061 Unspecified chronic otitis externa, right ear: Secondary | ICD-10-CM | POA: Diagnosis not present

## 2015-09-09 DIAGNOSIS — H6123 Impacted cerumen, bilateral: Secondary | ICD-10-CM | POA: Diagnosis not present

## 2015-09-28 ENCOUNTER — Other Ambulatory Visit: Payer: Self-pay

## 2015-09-28 DIAGNOSIS — Z1231 Encounter for screening mammogram for malignant neoplasm of breast: Secondary | ICD-10-CM

## 2015-10-07 DIAGNOSIS — Z23 Encounter for immunization: Secondary | ICD-10-CM | POA: Diagnosis not present

## 2015-10-21 DIAGNOSIS — E559 Vitamin D deficiency, unspecified: Secondary | ICD-10-CM | POA: Diagnosis not present

## 2015-10-21 DIAGNOSIS — E538 Deficiency of other specified B group vitamins: Secondary | ICD-10-CM | POA: Diagnosis not present

## 2015-10-21 DIAGNOSIS — E785 Hyperlipidemia, unspecified: Secondary | ICD-10-CM | POA: Diagnosis not present

## 2015-10-21 DIAGNOSIS — I10 Essential (primary) hypertension: Secondary | ICD-10-CM | POA: Diagnosis not present

## 2015-10-21 DIAGNOSIS — R8299 Other abnormal findings in urine: Secondary | ICD-10-CM | POA: Diagnosis not present

## 2015-10-21 DIAGNOSIS — H04123 Dry eye syndrome of bilateral lacrimal glands: Secondary | ICD-10-CM | POA: Diagnosis not present

## 2015-10-21 DIAGNOSIS — H11823 Conjunctivochalasis, bilateral: Secondary | ICD-10-CM | POA: Diagnosis not present

## 2015-10-21 DIAGNOSIS — Z961 Presence of intraocular lens: Secondary | ICD-10-CM | POA: Diagnosis not present

## 2015-10-21 DIAGNOSIS — H10413 Chronic giant papillary conjunctivitis, bilateral: Secondary | ICD-10-CM | POA: Diagnosis not present

## 2015-10-21 DIAGNOSIS — R7301 Impaired fasting glucose: Secondary | ICD-10-CM | POA: Diagnosis not present

## 2015-10-21 DIAGNOSIS — N390 Urinary tract infection, site not specified: Secondary | ICD-10-CM | POA: Diagnosis not present

## 2015-10-26 DIAGNOSIS — Z6825 Body mass index (BMI) 25.0-25.9, adult: Secondary | ICD-10-CM | POA: Diagnosis not present

## 2015-10-26 DIAGNOSIS — Z1389 Encounter for screening for other disorder: Secondary | ICD-10-CM | POA: Diagnosis not present

## 2015-10-26 DIAGNOSIS — M797 Fibromyalgia: Secondary | ICD-10-CM | POA: Diagnosis not present

## 2015-10-26 DIAGNOSIS — M5380 Other specified dorsopathies, site unspecified: Secondary | ICD-10-CM | POA: Diagnosis not present

## 2015-10-26 DIAGNOSIS — M545 Low back pain: Secondary | ICD-10-CM | POA: Diagnosis not present

## 2015-10-26 DIAGNOSIS — M1990 Unspecified osteoarthritis, unspecified site: Secondary | ICD-10-CM | POA: Diagnosis not present

## 2015-10-26 DIAGNOSIS — D519 Vitamin B12 deficiency anemia, unspecified: Secondary | ICD-10-CM | POA: Diagnosis not present

## 2015-10-26 DIAGNOSIS — Z0000 Encounter for general adult medical examination without abnormal findings: Secondary | ICD-10-CM | POA: Diagnosis not present

## 2015-10-26 DIAGNOSIS — R7301 Impaired fasting glucose: Secondary | ICD-10-CM | POA: Diagnosis not present

## 2015-10-26 DIAGNOSIS — F419 Anxiety disorder, unspecified: Secondary | ICD-10-CM | POA: Diagnosis not present

## 2015-10-26 DIAGNOSIS — E785 Hyperlipidemia, unspecified: Secondary | ICD-10-CM | POA: Diagnosis not present

## 2015-10-26 DIAGNOSIS — C50919 Malignant neoplasm of unspecified site of unspecified female breast: Secondary | ICD-10-CM | POA: Diagnosis not present

## 2015-11-03 ENCOUNTER — Ambulatory Visit
Admission: RE | Admit: 2015-11-03 | Discharge: 2015-11-03 | Disposition: A | Discharge status: Home / Self Care | Payer: Medicare Other | Source: Ambulatory Visit

## 2015-11-03 DIAGNOSIS — Z1231 Encounter for screening mammogram for malignant neoplasm of breast: Secondary | ICD-10-CM

## 2015-11-16 DIAGNOSIS — N362 Urethral caruncle: Secondary | ICD-10-CM | POA: Diagnosis not present

## 2015-11-16 DIAGNOSIS — N952 Postmenopausal atrophic vaginitis: Secondary | ICD-10-CM | POA: Diagnosis not present

## 2015-11-16 DIAGNOSIS — N368 Other specified disorders of urethra: Secondary | ICD-10-CM | POA: Diagnosis not present

## 2016-01-18 ENCOUNTER — Telehealth: Payer: Self-pay | Admitting: Hematology and Oncology

## 2016-01-18 NOTE — Telephone Encounter (Signed)
Patient called to schedule LTS appointment with Chestine Spore 2/23

## 2016-02-04 ENCOUNTER — Other Ambulatory Visit: Payer: Self-pay | Admitting: *Deleted

## 2016-02-04 ENCOUNTER — Ambulatory Visit (HOSPITAL_BASED_OUTPATIENT_CLINIC_OR_DEPARTMENT_OTHER): Payer: Medicare HMO

## 2016-02-04 ENCOUNTER — Ambulatory Visit
Admission: RE | Admit: 2016-02-04 | Discharge: 2016-02-04 | Disposition: A | Discharge status: Home / Self Care | Payer: Medicare HMO | Source: Ambulatory Visit | Attending: Nurse Practitioner | Admitting: Nurse Practitioner

## 2016-02-04 ENCOUNTER — Ambulatory Visit (HOSPITAL_BASED_OUTPATIENT_CLINIC_OR_DEPARTMENT_OTHER): Payer: Medicare HMO | Admitting: Nurse Practitioner

## 2016-02-04 ENCOUNTER — Other Ambulatory Visit: Payer: Self-pay | Admitting: Nurse Practitioner

## 2016-02-04 ENCOUNTER — Encounter: Payer: Self-pay | Admitting: Nurse Practitioner

## 2016-02-04 ENCOUNTER — Telehealth: Payer: Self-pay | Admitting: *Deleted

## 2016-02-04 ENCOUNTER — Telehealth: Payer: Self-pay | Admitting: Nurse Practitioner

## 2016-02-04 VITALS — BP 150/71 | HR 86 | Temp 98.2°F | Resp 18 | Ht 63.0 in | Wt 143.5 lb

## 2016-02-04 DIAGNOSIS — R300 Dysuria: Secondary | ICD-10-CM

## 2016-02-04 DIAGNOSIS — C50412 Malignant neoplasm of upper-outer quadrant of left female breast: Secondary | ICD-10-CM

## 2016-02-04 DIAGNOSIS — N644 Mastodynia: Secondary | ICD-10-CM

## 2016-02-04 DIAGNOSIS — Z853 Personal history of malignant neoplasm of breast: Secondary | ICD-10-CM

## 2016-02-04 LAB — URINALYSIS, MICROSCOPIC - CHCC
Bilirubin (Urine): NEGATIVE
Glucose: NEGATIVE mg/dL
Ketones: NEGATIVE mg/dL
Nitrite: POSITIVE
Specific Gravity, Urine: 1.015 (ref 1.003–1.035)
Urobilinogen, UR: 0.2 mg/dL (ref 0.2–1)
pH: 5 (ref 4.6–8.0)

## 2016-02-04 MED ORDER — AMOXICILLIN-POT CLAVULANATE 500-125 MG PO TABS: 1.0000 | ORAL_TABLET | Freq: Two times a day (BID) | ORAL | Status: DC

## 2016-02-04 NOTE — Progress Notes (Signed)
CLINIC:  Cancer Survivorship   REASON FOR VISIT:  Routine follow-up post-treatment for history of breast cancer.  BRIEF ONCOLOGIC HISTORY:    Malignant neoplasm of upper-outer quadrant of female breast ([place])   07/01/2002 Surgery Left breast lumpectomy, stage 2 invasive ductal carcinoma with DCIS ER positive PR positive HER-2 negative, 3.4 cm tumor   07/01/2002 Pathologic Stage Stage IIA: T2 N0   08/06/2002 - 11/06/2002 Chemotherapy Adjuvant chemotherapy with CMF 6   12/06/2002 - 12/31/2002 Radiation Therapy Adjuvant radiation therapy   01/29/2003 - 01/06/2009 Anti-estrogen oral therapy Tamoxifen for 1 year then Arimidex 1 mg daily    INTERVAL HISTORY:  Marie Alvarez presents to the [facility] today for ongoing follow up regarding her history of breast cancer. Overall, Marie Alvarez reports doing well since her last visit with Dr. Lindi Adie in Feburary 2016. She has not noticed any change within her breast and her last mammogram was in November 2016 and was unremarkable.  She denies any headache, cough, shortness of breath, or bone pain. She reports a good appetite and denies any weight loss.  Marie Alvarez does report a several day history of increasing urinary urgency with some dysuria.  She denies blood in her urine or back pain.  She states that she has not been drinking as much water as she likely should and that she feels that her urine is more concentrated. She denies any fever or chills.    REVIEW OF SYSTEMS:   HEENT: Denies visual changes, hearing loss, mouth sores, or difficulty swallowing. Cardiac: Denies palpitations and lower extremity edema.  Respiratory: Denies wheeze or dyspnea on exertion.  Breast: Denies any new nodularity, masses, tenderness, nipple changes, or nipple discharge.  GI: Denies abdominal pain, constipation, diarrhea, nausea, or vomiting.  GU: Dysuria and urgency as above.  Otherwise, denies hematuria, vaginal bleeding, vaginal discharge, or vaginal dryness.    Musculoskeletal: Arthritis pain.  Otherwise, denies joint or bone pain.  Neuro: Denies recent fall or numbness / tingling in her extremities.  Skin: Denies rash, pruritis, or open wounds.  Psych: Denies depression, anxiety, insomnia, or memory loss.   A 14-point review of systems was completed and was negative, except as noted above.   ONCOLOGY TREATMENT TEAM:  1. Medical Oncologist: Dr. Lindi Adie 3. Radiation Oncologist: Dr. Tammi Klippel    PAST MEDICAL/SURGICAL HISTORY:  Past Medical History  Diagnosis Date  . Cancer ([place]) 2003    BREAST-LEFT  . Hypertension   . Osteoporosis   . Hx estrogen therapy 02/02/2012  . Hyperlipidemia   . GERD (gastroesophageal reflux disease)   . Shingles   . Suburethral cyst 03/2012    Urologic evaluation recommended no special followup  . Scoliosis    Past Surgical History  Procedure Laterality Date  . Breast surgery  07-2002    LEFT LUMPECTOMY  . Tonsilectomy, adenoidectomy, bilateral myringotomy and tubes  1944  . Appendectomy  1946     ALLERGIES:  Allergies  Allergen Reactions  . Sulfa Antibiotics Hives  . Prednisone     "makes me goofy"     CURRENT MEDICATIONS:  Current Outpatient Prescriptions on File Prior to Visit  Medication Sig Dispense Refill  . amLODipine (NORVASC) 5 MG tablet Take 2.5 mg by mouth 2 (two) times daily.     Marland Kitchen aspirin 81 MG tablet Take 81 mg by mouth daily.     Marland Kitchen CALCIUM PO Take by mouth daily.    . Cholecalciferol (VITAMIN D-3 PO) Take by mouth.    Marland Kitchen  fish oil-omega-3 fatty acids 1000 MG capsule Take 2 g by mouth daily.    . irbesartan (AVAPRO) 150 MG tablet Take 150 mg by mouth daily.    Marland Kitchen NORTRIPTYLINE HCL PO Take 10 mg by mouth 2 (two) times daily.     . Pantoprazole Sodium (PROTONIX PO) Take 40 mg by mouth daily.     Marland Kitchen PRAVASTATIN SODIUM PO Take 40 mg by mouth daily.      No current facility-administered medications on file prior to visit.     ONCOLOGIC FAMILY HISTORY:  Family History  Problem  Relation Age of Onset  . Heart disease Mother     Angina  . Heart disease Father     Unknown     SOCIAL HISTORY:  Marie Alvarez is widowed and lives alone in [place], [place].  She has 2 children. Marie Alvarez is currently retired.  She denies any current or history of tobacco, alcohol, or illicit drug use.     PHYSICAL EXAMINATION:  Vital Signs: Filed Vitals:   02/04/16 1303  BP: 150/71  Pulse: 86  Temp: 98.2 F (36.8 C)  Resp: 18   ECOG performance status: 1 General: Well-nourished, well-appearing female in no acute distress. HEENT: Head is atraumatic and normocephalic.  Pupils equal and reactive to light and accomodation. Conjunctivae clear without exudate.  Sclerae anicteric. Slight extropion in lower eyelids bilaterally. Oral mucosa is pink, moist, and intact without lesions.  Oropharynx is pink without lesions or erythema.  Lymph: No cervical, supraclavicular, infraclavicular, or axillary lymphadenopathy noted on palpation.  Cardiovascular: Regular rate and rhythm without murmurs, rubs, or gallops. Respiratory: Clear to auscultation bilaterally. Chest expansion symmetric without accessory muscle use on inspiration or expiration.  Breast: Bilateral breast exam performed.  Tender to palpation along left breast without discrete mass or nodularity.  Tender to palpation right  breast with area of retroaerolar thickening. No mass or nodule.  Pt states that this is new for her. GI: Abdomen soft and round. No tenderness to palpation. Bowel sounds normoactive in 4 quadrants.   GU: Deferred.  Musculoskeletal: Muscle strength 5/5 in all extremities.   Neuro: No focal deficits. Steady gait.  Psych: Mood and affect normal and appropriate for situation.  Extremities: No edema, cyanosis, or clubbing.  Skin: Warm and dry. No open lesions noted.   LABORATORY DATA:  Recent Results (from the past 2160 hour(s))  Urinalysis with microscopic - [facility]     Status: None   Collection Time: 02/04/16   2:15 PM  Result Value Ref Range   Glucose Negative Negative mg/dL   Bilirubin (Urine) Negative Negative   Ketones Negative Negative mg/dL   Specific Gravity, Urine 1.015 1.003 - 1.035   Blood Large Negative   pH 5.0 4.6 - 8.0   Protein < 30 Negative- <30 mg/dL   Urobilinogen, UR 0.2 0.2 - 1 mg/dL   Nitrite Positive Negative   Leukocyte Esterase Moderate Negative   RBC / HPF TNTC 0 - 2   WBC, UA TNTC 0 - 2   Bacteria, UA Moderate Negative- Trace   Epithelial Cells Occasional Negative- Few    DIAGNOSTIC IMAGING: 1. Bilateral screening mammogram performed 11/03/2015 reveals extremely dense breasts, category D.  No findings suspicious for malignancy.  Recommended follow up in one year.  2. Left diagnostic mammogram with ultrasound performed 02/03/2016 reveals fibroglandular tissue in the retroareolar and periaerolar right breast without solid or cystic mass or abnormal shadowing identified to suggest malignancy.  Breast density category  C, heterogeneously dense.  Recommended follow up in November 2017.     ASSESSMENT AND PLAN:   1. History of breast cancer: Stage IIA invasive ductal carcinoma of the left breast (06/2002), ER positive, PR positive, HER2/neu negative, S/P lumpectomy (06/2002) followed by adjuvant chemotherapy with cyclophosphamide, methotrexate, and 5-flurouracil followed by adjuvant radiation therapy (12/2002) followed by 6 years of adjuvant endocrine therapy with tamoxifen and anastrozole completed 12/2008, now followed in a program of surveillance.  Marie Alvarez demonstrated some new tenderness to palpation along her right breast along with retroaerolar thickening.  She underwent diagnostic mammogram of this area following her appointment today which revealed fibroglandular tissue with no evidence suggestive of malignancy. I have reviewed the recommendations for ongoing surveillance with her and she will follow-up with [place] in the Survivorship clinic in one year's time. She will be due  bilateral mammogram in November 2017 and we will schedule this accordingly.  She was instructed to notify us if she notes any change within her breast, any new symptoms such as pain, shortness of breath, weight loss, or fatigue.   2. Dysuria: Based on Marie Alvarez's complaints of dysuria, I elected to check an urinalysis following her visit which revealed the presence of nitrites, leukocyte esterase, blood, and bacteria.  The urine was sent for culture and I have placed her on amoxicillin/clav BID x 7 days as she has an allergy to sulfa drugs and based on insurance constraints.  She will notify us if her symptoms do not improve.  3. Cancer screening:  Based on discussions with her gastroenterologist and gynecologist, Marie Alvarez no longer is undergoing screening for cervical or colorectal cancer.        A total of 30 minutes of face-to-face time was spent with this patient with greater than 50% of that time in counseling and care-coordination.   Sylvan Cheese, NP  Survivorship [facility] [place] [phone]   Note: PRIMARY CARE PROVIDER Jerlyn Ly, [place] [phone]

## 2016-02-04 NOTE — Patient Instructions (Addendum)
Thank you for coming in today!  As we discussed, we will check an ultrasound of your right breast regarding that tenderness.  I will call you with the results once I receive them.  We will also check an urinalysis today to see if you have any signs of an urinary tract infection.  I will call you with those results, too.  You will be due your regular mammogram in November 2017 and will return to see me in one year's time.  Please be sure to keep your annual visit with Dr. Phineas Real and let us know if you have any changes!  Symptoms to Watch for and Report to Your Provider  . Return of the cancer symptoms you had before- such as a lump or new growth where your cancer first started . New or unusual pain that seems unrelated to an injury and does not go away, including back pain or bone pain . Weight loss without trying/intending . Unexplained bleeding . A rash or allergic reaction, such as swelling, severe itching or wheezing . Chills or fevers . Persistent headaches . Shortness of breath or difficulty breathing . Bloody stools or blood in your urine . Lumps, bumps, swelling and/or nipple discharge . Nausea, vomiting, diarrhea, loss of appetite, or trouble swallowing . A cough that doesn't go away . Abdominal pain . Swelling in your arms or legs . Fractures . Hot flashes or other menopausal symptoms . Any other signs mentioned by your doctor or nurse or any unusual symptoms                 that you just can't explain   NOTE: Just because you have certain symptoms, it doesn't mean the cancer has come back or you have a new cancer. Symptoms can be due to other problems that need to be addressed.  It is important to watch for these symptoms and report them to your provider so you can be medically evaluated for any of these concerns!    Living a Life of Wellness After Cancer:  *Note: Please consult your health care provider before using any medications, supplements, over-the-counter products, or  other interventions.  Also, please consult your primary care provider before you begin any lifestyle program (diet, exercise, etc.).  Your safety is our top priority and we want to make sure you continue to live a long and healthy life!    Healthy Lifestyle Recommendations  As a cancer survivor, it is important develop a lifelong commitment to a healthy lifestyle. A healthy lifestyle can prevent cancer from returning as well as prevent other diseases like heart disease, diabetes and high blood pressure.  These are some things that you can do to have a healthy lifestyle:  Marland Kitchen Maintain a healthy weight.  . Exercise daily per your doctor's orders. . Eat a balanced diet high in fruits, vegetables, bran, and fiber. Limit intake of red meat      and processed foods.  . Limit how much alcohol you consume, if at all. Ali Lowe regular bone mineral density testing for osteoporosis.  . Talk to your doctor about cardiovascular disease or "heart disease" screening. . Stop smoking (if you smoke). . Know your family history. . Be mindful of your emotional, social, and spiritual needs. . Meet regularly with a Primary Care Provider (PCP). Find a PCP if you do not             already have one. . Talk to your doctor about regular cancer screening  including screening for colon           cancer, GYN cancers, and skin cancer.

## 2016-02-04 NOTE — Telephone Encounter (Signed)
LM for rtn call- pt has UTI and abx called into the [facility] in [place] for her. Please advise pt when she returns call.

## 2016-02-04 NOTE — Telephone Encounter (Signed)
per pof to sch pt appt-sch mamma & US-gave pt copy of avs

## 2016-02-05 ENCOUNTER — Telehealth: Payer: Self-pay | Admitting: Nurse Practitioner

## 2016-02-05 ENCOUNTER — Other Ambulatory Visit: Payer: Self-pay | Admitting: Nurse Practitioner

## 2016-02-05 DIAGNOSIS — Z1231 Encounter for screening mammogram for malignant neoplasm of breast: Secondary | ICD-10-CM

## 2016-02-05 NOTE — Telephone Encounter (Signed)
Attempted to reach patient to discuss results of diagnostic mammogram and ensure that she was able to pick up her antibiotic from [facility] yesterday.  Left message with direct number asking for return call.

## 2016-02-06 LAB — URINE CULTURE

## 2016-02-08 ENCOUNTER — Telehealth: Payer: Self-pay | Admitting: Nurse Practitioner

## 2016-02-08 NOTE — Telephone Encounter (Signed)
Received call from patient after leaving message last week for return call.  Picked up rx for antibiotics and is taking them.  Reports that symptoms have improved and that she will complete course of therapy.  Advised of ursine sensitivity results as well as diagnostic mammogram results.  Pt to monitor and report any change.  No questions at this time.

## 2016-02-17 ENCOUNTER — Telehealth: Payer: Self-pay | Admitting: Nurse Practitioner

## 2016-02-17 ENCOUNTER — Other Ambulatory Visit: Payer: Self-pay | Admitting: Nurse Practitioner

## 2016-02-17 ENCOUNTER — Other Ambulatory Visit (HOSPITAL_BASED_OUTPATIENT_CLINIC_OR_DEPARTMENT_OTHER): Payer: Medicare HMO

## 2016-02-17 ENCOUNTER — Other Ambulatory Visit: Payer: Self-pay | Admitting: *Deleted

## 2016-02-17 DIAGNOSIS — R300 Dysuria: Secondary | ICD-10-CM

## 2016-02-17 LAB — URINALYSIS, MICROSCOPIC - CHCC
BILIRUBIN (URINE): NEGATIVE
Glucose: NEGATIVE mg/dL
Ketones: NEGATIVE mg/dL
NITRITE: POSITIVE
Protein: NEGATIVE mg/dL
SPECIFIC GRAVITY, URINE: 1.015 (ref 1.003–1.035)
Urobilinogen, UR: 0.2 mg/dL (ref 0.2–1)
pH: 6 (ref 4.6–8.0)

## 2016-02-17 MED ORDER — AMOXICILLIN-POT CLAVULANATE 500-125 MG PO TABS: 1.0000 | ORAL_TABLET | Freq: Two times a day (BID) | ORAL | Status: DC

## 2016-02-17 NOTE — Telephone Encounter (Signed)
per pof to sch pt appt-cld & spoke to pt and gave pt time for lab today

## 2016-02-17 NOTE — Telephone Encounter (Signed)
Called and left message for patient; will extend antibiotics by 5 days based on today's urinalysis results.  Sent for culture.  Will send rx to [facility] [place] and have asked pt to pick up and begin today.

## 2016-02-19 LAB — URINE CULTURE

## 2016-02-24 ENCOUNTER — Encounter: Payer: Self-pay | Admitting: Gynecology

## 2016-02-24 ENCOUNTER — Ambulatory Visit (INDEPENDENT_AMBULATORY_CARE_PROVIDER_SITE_OTHER): Payer: Medicare HMO | Admitting: Gynecology

## 2016-02-24 VITALS — BP 124/70 | Ht 63.0 in | Wt 146.0 lb

## 2016-02-24 DIAGNOSIS — C50912 Malignant neoplasm of unspecified site of left female breast: Secondary | ICD-10-CM

## 2016-02-24 DIAGNOSIS — M810 Age-related osteoporosis without current pathological fracture: Secondary | ICD-10-CM | POA: Diagnosis not present

## 2016-02-24 DIAGNOSIS — N398 Other specified disorders of urinary system: Secondary | ICD-10-CM

## 2016-02-24 DIAGNOSIS — Z01419 Encounter for gynecological examination (general) (routine) without abnormal findings: Secondary | ICD-10-CM

## 2016-02-24 DIAGNOSIS — R8279 Other abnormal findings on microbiological examination of urine: Secondary | ICD-10-CM | POA: Diagnosis not present

## 2016-02-24 DIAGNOSIS — N952 Postmenopausal atrophic vaginitis: Secondary | ICD-10-CM | POA: Diagnosis not present

## 2016-02-24 DIAGNOSIS — N368 Other specified disorders of urethra: Secondary | ICD-10-CM

## 2016-02-24 NOTE — Patient Instructions (Signed)
Follow up in one year for exam

## 2016-02-24 NOTE — Progress Notes (Signed)
    CHENIN BLAISDELL [birth year] [id number]        82 y.o.  [id number]  for breast and pelvic exam. Several issues noted below.  Past medical history,surgical history, problem list, medications, allergies, family history and social history were all reviewed and documented as reviewed in the EPIC chart.  ROS:  Performed with pertinent positives and negatives included in the history, assessment and plan.   Additional significant findings :  none   Exam: [name] assistant Filed Vitals:   02/24/16 1052  BP: 124/70  Height: 5\' 3"  (1.6 m)  Weight: 146 lb (66.225 kg)   General appearance:  Normal affect, orientation and appearance. Skin: Grossly normal HEENT: Without gross lesions.  No cervical or supraclavicular adenopathy. Thyroid normal.  Lungs:  Clear without wheezing, rales or rhonchi Cardiac: RR, without RMG Abdominal:  Soft, nontender, without masses, guarding, rebound, organomegaly or hernia Breasts:  Examined lying and sitting. Right without masses, retractions, discharge or axillary adenopathy.  Left with well-healed lumpectomy scar with mild distortion. Stable over time. No masses, discharge or adenopathy Pelvic:  Ext/BUS/vagina with atrophic changes. 1-2 cm mid anterior vaginal wall cystic area several fingerbreadths from introitus. Nontender.  Cervix flushed upper vagina.  Uterus grossly normal size, nontender   Adnexa without masses or tenderness    Anus and perineum normal   Rectovaginal normal sphincter tone without palpated masses or tenderness.    Assessment/Plan:  82 y.o. [id number] female for breast and pelvic exam..   1. Postmenopausal/atrophic genital changes. No significant hot flushes, night sweats, vaginal dryness or any vaginal bleeding. Continue monitor and report any issues or bleeding. 2. Stable suburethral midline cyst. Asymptomatic to the patient. Evaluated by urology with recommended no intervention. Continue to follow with annual exams. 3. History of left  breast cancer status post lumpectomy.  Exam an NED. Mammography 01/2016. Continue with annual mammography. SBE monthly reviewed. 4. Osteoporosis. Reports being followed by Dr. Joylene Draft. It appears she is due for a bone density now she is going to call and discuss this with him and arrange as he sees appropriate. 5. Pap smear 2012. No Pap smear done today. No history of significant abnormal Pap smears. Based on current screening guidelines we both agree to stop screening based on age. 6. Colonoscopy a number of years ago. Per her history they recommended no further colonoscopies based on age. 7. Health maintenance. No routine lab work done as patient reports this done at her primary physician's office.   [name] MD, 11:25 AM 02/24/2016

## 2016-02-25 LAB — URINALYSIS W MICROSCOPIC + REFLEX CULTURE
Bacteria, UA: NONE SEEN [HPF]
Bilirubin Urine: NEGATIVE
CASTS: NONE SEEN [LPF]
CRYSTALS: NONE SEEN [HPF]
Glucose, UA: NEGATIVE
HGB URINE DIPSTICK: NEGATIVE
KETONES UR: NEGATIVE
Nitrite: NEGATIVE
Protein, ur: NEGATIVE
RBC / HPF: NONE SEEN RBC/HPF (ref <=2)
SPECIFIC GRAVITY, URINE: 1.006 (ref 1.001–1.035)
SQUAMOUS EPITHELIAL / LPF: NONE SEEN [HPF] (ref <=5)
YEAST: NONE SEEN [HPF]
pH: 6 (ref 5.0–8.0)

## 2016-02-25 LAB — URINE CULTURE

## 2016-04-27 DIAGNOSIS — Z961 Presence of intraocular lens: Secondary | ICD-10-CM | POA: Diagnosis not present

## 2016-04-27 DIAGNOSIS — H26492 Other secondary cataract, left eye: Secondary | ICD-10-CM | POA: Diagnosis not present

## 2016-09-12 DIAGNOSIS — Z23 Encounter for immunization: Secondary | ICD-10-CM | POA: Diagnosis not present

## 2016-11-07 ENCOUNTER — Ambulatory Visit: Payer: Medicare HMO

## 2016-11-07 ENCOUNTER — Telehealth: Payer: Self-pay | Admitting: *Deleted

## 2016-11-07 ENCOUNTER — Ambulatory Visit
Admission: RE | Admit: 2016-11-07 | Discharge: 2016-11-07 | Disposition: A | Discharge status: Home / Self Care | Payer: Medicare HMO | Source: Ambulatory Visit | Attending: Nurse Practitioner | Admitting: Nurse Practitioner

## 2016-11-07 DIAGNOSIS — M859 Disorder of bone density and structure, unspecified: Secondary | ICD-10-CM | POA: Diagnosis not present

## 2016-11-07 DIAGNOSIS — I10 Essential (primary) hypertension: Secondary | ICD-10-CM | POA: Diagnosis not present

## 2016-11-07 DIAGNOSIS — Z0000 Encounter for general adult medical examination without abnormal findings: Secondary | ICD-10-CM | POA: Diagnosis not present

## 2016-11-07 DIAGNOSIS — D519 Vitamin B12 deficiency anemia, unspecified: Secondary | ICD-10-CM | POA: Diagnosis not present

## 2016-11-07 DIAGNOSIS — D518 Other vitamin B12 deficiency anemias: Secondary | ICD-10-CM | POA: Diagnosis not present

## 2016-11-07 DIAGNOSIS — Z1231 Encounter for screening mammogram for malignant neoplasm of breast: Secondary | ICD-10-CM | POA: Diagnosis not present

## 2016-11-07 DIAGNOSIS — M8580 Other specified disorders of bone density and structure, unspecified site: Secondary | ICD-10-CM | POA: Diagnosis not present

## 2016-11-07 DIAGNOSIS — R7301 Impaired fasting glucose: Secondary | ICD-10-CM | POA: Diagnosis not present

## 2016-11-07 DIAGNOSIS — E784 Other hyperlipidemia: Secondary | ICD-10-CM | POA: Diagnosis not present

## 2016-11-07 DIAGNOSIS — E785 Hyperlipidemia, unspecified: Secondary | ICD-10-CM | POA: Diagnosis not present

## 2016-11-07 NOTE — Telephone Encounter (Signed)
Called patient to let her know screening mammogram is negative

## 2016-11-14 DIAGNOSIS — M545 Low back pain: Secondary | ICD-10-CM | POA: Diagnosis not present

## 2016-11-14 DIAGNOSIS — Z0000 Encounter for general adult medical examination without abnormal findings: Secondary | ICD-10-CM | POA: Diagnosis not present

## 2016-11-14 DIAGNOSIS — D692 Other nonthrombocytopenic purpura: Secondary | ICD-10-CM | POA: Diagnosis not present

## 2016-11-14 DIAGNOSIS — I872 Venous insufficiency (chronic) (peripheral): Secondary | ICD-10-CM | POA: Diagnosis not present

## 2016-11-14 DIAGNOSIS — I10 Essential (primary) hypertension: Secondary | ICD-10-CM | POA: Diagnosis not present

## 2016-11-14 DIAGNOSIS — M5380 Other specified dorsopathies, site unspecified: Secondary | ICD-10-CM | POA: Diagnosis not present

## 2016-11-14 DIAGNOSIS — E784 Other hyperlipidemia: Secondary | ICD-10-CM | POA: Diagnosis not present

## 2016-11-14 DIAGNOSIS — R69 Illness, unspecified: Secondary | ICD-10-CM | POA: Diagnosis not present

## 2016-11-14 DIAGNOSIS — C50919 Malignant neoplasm of unspecified site of unspecified female breast: Secondary | ICD-10-CM | POA: Diagnosis not present

## 2016-11-14 DIAGNOSIS — R05 Cough: Secondary | ICD-10-CM | POA: Diagnosis not present

## 2016-11-18 DIAGNOSIS — N368 Other specified disorders of urethra: Secondary | ICD-10-CM | POA: Diagnosis not present

## 2016-11-18 DIAGNOSIS — N362 Urethral caruncle: Secondary | ICD-10-CM | POA: Diagnosis not present

## 2016-11-18 DIAGNOSIS — N952 Postmenopausal atrophic vaginitis: Secondary | ICD-10-CM | POA: Diagnosis not present

## 2016-12-12 DIAGNOSIS — C50919 Malignant neoplasm of unspecified site of unspecified female breast: Secondary | ICD-10-CM

## 2016-12-12 HISTORY — DX: Malignant neoplasm of unspecified site of unspecified female breast: C50.919

## 2017-01-16 DIAGNOSIS — M859 Disorder of bone density and structure, unspecified: Secondary | ICD-10-CM | POA: Diagnosis not present

## 2017-01-27 DIAGNOSIS — R69 Illness, unspecified: Secondary | ICD-10-CM | POA: Diagnosis not present

## 2017-01-27 DIAGNOSIS — Z7982 Long term (current) use of aspirin: Secondary | ICD-10-CM | POA: Diagnosis not present

## 2017-01-27 DIAGNOSIS — Z833 Family history of diabetes mellitus: Secondary | ICD-10-CM | POA: Diagnosis not present

## 2017-01-27 DIAGNOSIS — I10 Essential (primary) hypertension: Secondary | ICD-10-CM | POA: Diagnosis not present

## 2017-01-27 DIAGNOSIS — Z803 Family history of malignant neoplasm of breast: Secondary | ICD-10-CM | POA: Diagnosis not present

## 2017-01-27 DIAGNOSIS — Z853 Personal history of malignant neoplasm of breast: Secondary | ICD-10-CM | POA: Diagnosis not present

## 2017-01-27 DIAGNOSIS — E785 Hyperlipidemia, unspecified: Secondary | ICD-10-CM | POA: Diagnosis not present

## 2017-01-27 DIAGNOSIS — K219 Gastro-esophageal reflux disease without esophagitis: Secondary | ICD-10-CM | POA: Diagnosis not present

## 2017-01-27 DIAGNOSIS — Z8249 Family history of ischemic heart disease and other diseases of the circulatory system: Secondary | ICD-10-CM | POA: Diagnosis not present

## 2017-01-27 DIAGNOSIS — Z882 Allergy status to sulfonamides status: Secondary | ICD-10-CM | POA: Diagnosis not present

## 2017-02-06 ENCOUNTER — Telehealth: Payer: Self-pay | Admitting: Adult Health

## 2017-02-06 NOTE — Telephone Encounter (Signed)
Pt called to r/s appt to 3/7 at 10 am

## 2017-02-08 ENCOUNTER — Other Ambulatory Visit: Payer: Medicare HMO

## 2017-02-08 ENCOUNTER — Encounter: Payer: Medicare HMO | Admitting: Nurse Practitioner

## 2017-02-09 ENCOUNTER — Other Ambulatory Visit: Payer: Medicare HMO

## 2017-02-09 ENCOUNTER — Encounter: Payer: Medicare HMO | Admitting: Adult Health

## 2017-02-14 ENCOUNTER — Other Ambulatory Visit: Payer: Self-pay | Admitting: Adult Health

## 2017-02-14 DIAGNOSIS — C50412 Malignant neoplasm of upper-outer quadrant of left female breast: Secondary | ICD-10-CM

## 2017-02-15 ENCOUNTER — Encounter: Payer: Self-pay | Admitting: Adult Health

## 2017-02-15 ENCOUNTER — Ambulatory Visit (HOSPITAL_BASED_OUTPATIENT_CLINIC_OR_DEPARTMENT_OTHER): Payer: Medicare HMO | Admitting: Adult Health

## 2017-02-15 ENCOUNTER — Other Ambulatory Visit (HOSPITAL_BASED_OUTPATIENT_CLINIC_OR_DEPARTMENT_OTHER): Payer: Medicare HMO

## 2017-02-15 VITALS — BP 165/61 | HR 74 | Temp 97.4°F | Resp 18 | Ht 63.0 in | Wt 146.4 lb

## 2017-02-15 DIAGNOSIS — C50412 Malignant neoplasm of upper-outer quadrant of left female breast: Secondary | ICD-10-CM

## 2017-02-15 DIAGNOSIS — Z853 Personal history of malignant neoplasm of breast: Secondary | ICD-10-CM | POA: Diagnosis not present

## 2017-02-15 DIAGNOSIS — M545 Low back pain: Secondary | ICD-10-CM

## 2017-02-15 DIAGNOSIS — Z170 Estrogen receptor positive status [ER+]: Principal | ICD-10-CM

## 2017-02-15 DIAGNOSIS — Z1239 Encounter for other screening for malignant neoplasm of breast: Secondary | ICD-10-CM

## 2017-02-15 LAB — COMPREHENSIVE METABOLIC PANEL
ALT: 11 U/L (ref 0–55)
AST: 16 U/L (ref 5–34)
Albumin: 4.3 g/dL (ref 3.5–5.0)
Alkaline Phosphatase: 75 U/L (ref 40–150)
Anion Gap: 6 mEq/L (ref 3–11)
BUN: 12.5 mg/dL (ref 7.0–26.0)
CHLORIDE: 105 meq/L (ref 98–109)
CO2: 29 meq/L (ref 22–29)
CREATININE: 0.8 mg/dL (ref 0.6–1.1)
Calcium: 10.5 mg/dL — ABNORMAL HIGH (ref 8.4–10.4)
EGFR: 71 mL/min/{1.73_m2} — ABNORMAL LOW (ref >90)
Glucose: 93 mg/dl (ref 70–140)
POTASSIUM: 4.1 meq/L (ref 3.5–5.1)
SODIUM: 140 meq/L (ref 136–145)
Total Bilirubin: 0.7 mg/dL (ref 0.20–1.20)
Total Protein: 7.2 g/dL (ref 6.4–8.3)

## 2017-02-15 LAB — CBC WITH DIFFERENTIAL/PLATELET
BASO%: 0.7 % (ref 0.0–2.0)
Basophils Absolute: 0 10*3/uL (ref 0.0–0.1)
EOS%: 3 % (ref 0.0–7.0)
Eosinophils Absolute: 0.2 10*3/uL (ref 0.0–0.5)
HCT: 44.8 % (ref 34.8–46.6)
HEMOGLOBIN: 14.9 g/dL (ref 11.6–15.9)
LYMPH%: 23.8 % (ref 14.0–49.7)
MCH: 29.9 pg (ref 25.1–34.0)
MCHC: 33.2 g/dL (ref 31.5–36.0)
MCV: 90 fL (ref 79.5–101.0)
MONO#: 0.5 10*3/uL (ref 0.1–0.9)
MONO%: 7.7 % (ref 0.0–14.0)
NEUT%: 64.8 % (ref 38.4–76.8)
NEUTROS ABS: 4 10*3/uL (ref 1.5–6.5)
Platelets: 169 10*3/uL (ref 145–400)
RBC: 4.98 10*6/uL (ref 3.70–5.45)
RDW: 13.4 % (ref 11.2–14.5)
WBC: 6.2 10*3/uL (ref 3.9–10.3)
lymph#: 1.5 10*3/uL (ref 0.9–3.3)

## 2017-02-15 NOTE — Progress Notes (Signed)
CLINIC:  Survivorship   REASON FOR VISIT:  Routine follow-up for history of breast cancer.   BRIEF ONCOLOGIC HISTORY:    Malignant neoplasm of upper-outer quadrant of female breast ([place])   07/01/2002 Surgery    Left breast lumpectomy, stage 2 invasive ductal carcinoma with DCIS ER positive PR positive HER-2 negative, 3.4 cm tumor      07/01/2002 Pathologic Stage    Stage IIA: T2 N0      08/06/2002 - 11/06/2002 Chemotherapy    Adjuvant chemotherapy with CMF 6      12/06/2002 - 12/31/2002 Radiation Therapy    Adjuvant radiation therapy      01/29/2003 - 01/06/2009 Anti-estrogen oral therapy    Tamoxifen for 1 year then Arimidex 1 mg daily        INTERVAL HISTORY:  Marie Alvarez presents to the [facility] today for routine follow-up for her history of breast cancer.  Overall, she reports feeling quite well. She continues to have routine care and f/u with her PCP Dr. Joylene Draft.  She does have some arthritic pains in her back and also has scoliosis.  She is a patient of Dr. Sherwood Gambler and he follows this.  She also has some allergies year round and has a cough, that has been evaluated by chest xray.  She is doing well otherwise.  She stays active in church and enjoys spending time with her sister who lives down the street from her.      REVIEW OF SYSTEMS:  Review of Systems  Constitutional: Negative for chills, fever, malaise/fatigue and weight loss.  HENT: Negative for hearing loss and tinnitus.   Eyes: Negative for blurred vision and double vision.  Respiratory: Positive for cough (followed by PCP, allergy related, chest xray normal). Negative for shortness of breath.   Cardiovascular: Positive for leg swelling. Negative for chest pain and palpitations.  Gastrointestinal: Negative for abdominal pain, blood in stool, constipation, diarrhea, heartburn, melena, nausea and vomiting.  Genitourinary: Negative for dysuria.  Musculoskeletal: Positive for back pain (managed by Dr.  Sherwood Gambler, has undergone imaging).  Skin: Negative for rash.  Neurological: Negative for dizziness, tingling, focal weakness and headaches.  Endo/Heme/Allergies: Positive for environmental allergies. Bruises/bleeds easily.  Psychiatric/Behavioral: Negative for depression. The patient is not nervous/anxious.   Breast: Denies any new nodularity, masses, tenderness, nipple changes, or nipple discharge.      PAST MEDICAL/SURGICAL HISTORY:  Past Medical History:  Diagnosis Date  . Cancer ([place]) 2003   BREAST-LEFT  . GERD (gastroesophageal reflux disease)   . Hx estrogen therapy 02/02/2012  . Hyperlipidemia   . Hypertension   . Osteoporosis   . Scoliosis   . Shingles   . Suburethral cyst 03/2012   Urologic evaluation recommended no special followup   Past Surgical History:  Procedure Laterality Date  . APPENDECTOMY  1946  . BREAST SURGERY  07-2002   LEFT LUMPECTOMY  . TONSILECTOMY, ADENOIDECTOMY, BILATERAL MYRINGOTOMY AND TUBES       ALLERGIES:  Allergies  Allergen Reactions  . Prednisone     "makes me goofy"  . Sulfamethoxazole Rash     CURRENT MEDICATIONS:  Outpatient Encounter Prescriptions as of 02/15/2017  Medication Sig  . amLODipine (NORVASC) 5 MG tablet Take 2.5 mg by mouth 2 (two) times daily.   Marland Kitchen aspirin 81 MG tablet Take 81 mg by mouth 3 (three) times a week.   Marland Kitchen CALCIUM PO Take by mouth daily.  . Cholecalciferol (VITAMIN D-3 PO) Take by mouth.  . fish oil-omega-3  fatty acids 1000 MG capsule Take 2 g by mouth daily.  . irbesartan (AVAPRO) 150 MG tablet Take 150 mg by mouth daily.  Marland Kitchen NORTRIPTYLINE HCL PO Take 10 mg by mouth at bedtime.   . Pantoprazole Sodium (PROTONIX PO) Take 40 mg by mouth daily.   Marland Kitchen PRAVASTATIN SODIUM PO Take 40 mg by mouth daily.   . Cyanocobalamin (VITAMIN B 12 PO) Take by mouth.   No facility-administered encounter medications on file as of 02/15/2017.      ONCOLOGIC FAMILY HISTORY:  Family History  Problem Relation Age of Onset  .  Heart disease Mother     Angina  . Heart disease Father     Unknown      SOCIAL HISTORY:  Marie Alvarez is widowed and lives with her 41 year old grandson (he lives with her) in [place], [place].  Marie Alvarez is currently retired.  She denies any current or history of tobacco, alcohol, or illicit drug use.     PHYSICAL EXAMINATION:  Vital Signs: Vitals:   02/15/17 1012  BP: (!) 165/61  Pulse: 74  Resp: 18  Temp: 97.4 F (36.3 C)   Filed Weights   02/15/17 1012  Weight: 146 lb 6.4 oz (66.4 kg)   General: Well-nourished, well-appearing female in no acute distress.  Unaccompanied today.   HEENT: Head is normocephalic.  Pupils equal and reactive to light. Conjunctivae clear without exudate.  Sclerae anicteric. Oral mucosa is pink, moist.  Oropharynx is pink without lesions or erythema.  Lymph: No cervical, supraclavicular, or infraclavicular lymphadenopathy noted on palpation.  Cardiovascular: Regular rate and rhythm.Marland Kitchen Respiratory: Clear to auscultation bilaterally. Chest expansion symmetric; breathing non-labored.  Breast Exam:  -Left breast: No appreciable masses on palpation. No skin redness, thickening, or peau d'orange appearance; no nipple retraction or nipple discharge; mild distortion in symmetry at previous lumpectomy site, well healed scar without erythema or nodularity.  -Right breast: No appreciable masses on palpation. No skin redness, thickening, or peau d'orange appearance; no nipple retraction or nipple discharge;  -Axilla: No axillary adenopathy bilaterally.  GI: Abdomen soft and round; non-tender, non-distended. Bowel sounds normoactive. No hepatosplenomegaly.   GU: Deferred.  Neuro: No focal deficits. Steady gait.  Psych: Mood and affect normal and appropriate for situation.  Extremities: Scant pedal edema MSK: no focal spinal tenderness to palpation, FROM in bilateral upper extremities Skin: Warm and dry.  LABORATORY DATA:  Appointment on 02/15/2017    Component Date Value Ref Range Status  . WBC 02/15/2017 6.2  3.9 - 10.3 10e3/uL Final  . NEUT# 02/15/2017 4.0  1.5 - 6.5 10e3/uL Final  . HGB 02/15/2017 14.9  11.6 - 15.9 g/dL Final  . HCT 02/15/2017 44.8  34.8 - 46.6 % Final  . Platelets 02/15/2017 169  145 - 400 10e3/uL Final  . MCV 02/15/2017 90.0  79.5 - 101.0 fL Final  . MCH 02/15/2017 29.9  25.1 - 34.0 pg Final  . MCHC 02/15/2017 33.2  31.5 - 36.0 g/dL Final  . RBC 02/15/2017 4.98  3.70 - 5.45 10e6/uL Final  . RDW 02/15/2017 13.4  11.2 - 14.5 % Final  . lymph# 02/15/2017 1.5  0.9 - 3.3 10e3/uL Final  . MONO# 02/15/2017 0.5  0.1 - 0.9 10e3/uL Final  . Eosinophils Absolute 02/15/2017 0.2  0.0 - 0.5 10e3/uL Final  . Basophils Absolute 02/15/2017 0.0  0.0 - 0.1 10e3/uL Final  . NEUT% 02/15/2017 64.8  38.4 - 76.8 % Final  . LYMPH% 02/15/2017 23.8  14.0 - 49.7 % Final  . MONO% 02/15/2017 7.7  0.0 - 14.0 % Final  . EOS% 02/15/2017 3.0  0.0 - 7.0 % Final  . BASO% 02/15/2017 0.7  0.0 - 2.0 % Final    DIAGNOSTIC IMAGING:  Most recent mammogram:      ASSESSMENT AND PLAN:  Marie Alvarez is a pleasant 82 y.o. female with history of Stage IIA left breast invasive ductal carcinoma, ER+/PR+/HER2-, diagnosed in 2003, treated with lumpectomy, chemotherapy, adjuvant radiation therapy, and anti-estrogen therapy with Tamoxifen followed by Anastrozole.  She [facility] for surveillance and routine follow-up.   1. History of breast cancer:  Marie Alvarez is currently clinically and radiographically without evidence of disease or recurrence of breast cancer. She will be due for mammogram in 10/2017; orders placed today. I encouraged her to call me with any questions or concerns before her next visit at the cancer center, and I would be happy to see her sooner, if needed.    2. Bone health:  Given Marie Alvarez's age, history of breast cancer, and her current anti-estrogen therapy with Anastrozole, she is at risk for bone demineralization.  Her last DEXA scan was ordered by her PCP recently, but we do not have access to those results.  She is currently taking calcium, vitamin d and performing weight bearing exercises to assist with her bone health.  I encouraged her to continue to do this.    3. Cancer screening:  Due to Marie Alvarez's history and her age, she should receive screening for skin cancers.  She was encouraged to follow-up with her PCP for appropriate cancer screenings.   4. Health maintenance and wellness promotion: Marie Alvarez was encouraged to consume 5-7 servings of fruits and vegetables per day. She was also encouraged to engage in moderate to vigorous exercise for 30 minutes per day most days of the week. She was instructed to limit her alcohol consumption and continue to abstain from tobacco use.    Dispo:  -Return to cancer center in one year for long term survivorship follow up -Mammogram will be due and was ordered for 11/07/2016   A total of (30) minutes of face-to-face time was spent with this patient with greater than 50% of that time in counseling and care-coordination.   Charlestine Massed, NP Survivorship [facility] [place] [phone]   Note: PRIMARY CARE PROVIDER Jerlyn Ly, [place] [phone]

## 2017-03-01 ENCOUNTER — Encounter: Payer: Medicare HMO | Admitting: Gynecology

## 2017-03-09 ENCOUNTER — Encounter: Payer: Self-pay | Admitting: Gynecology

## 2017-03-09 ENCOUNTER — Ambulatory Visit (INDEPENDENT_AMBULATORY_CARE_PROVIDER_SITE_OTHER): Payer: Medicare HMO | Admitting: Gynecology

## 2017-03-09 VITALS — BP 136/70 | Ht 62.25 in | Wt 144.0 lb

## 2017-03-09 DIAGNOSIS — Z01411 Encounter for gynecological examination (general) (routine) with abnormal findings: Secondary | ICD-10-CM

## 2017-03-09 DIAGNOSIS — C50912 Malignant neoplasm of unspecified site of left female breast: Secondary | ICD-10-CM

## 2017-03-09 DIAGNOSIS — N368 Other specified disorders of urethra: Secondary | ICD-10-CM

## 2017-03-09 DIAGNOSIS — N952 Postmenopausal atrophic vaginitis: Secondary | ICD-10-CM

## 2017-03-09 DIAGNOSIS — M810 Age-related osteoporosis without current pathological fracture: Secondary | ICD-10-CM

## 2017-03-09 NOTE — Progress Notes (Signed)
    Marie Alvarez 1931-06-22 [id number]        83 y.o.  [id number] for breast and pelvic exam  Past medical history,surgical history, problem list, medications, allergies, family history and social history were all reviewed and documented as reviewed in the EPIC chart.  ROS:  Performed with pertinent positives and negatives included in the history, assessment and plan.   Additional significant findings :  None   Exam: Copywriter, advertising Vitals:   03/09/17 1018  BP: 136/70  Weight: 144 lb (65.3 kg)  Height: 5' 2.25" (1.581 m)   Body mass index is 26.13 kg/m.  General appearance:  Normal affect, orientation and appearance. Skin: Grossly normal HEENT: Without gross lesions.  No cervical or supraclavicular adenopathy. Thyroid normal.  Lungs:  Clear without wheezing, rales or rhonchi Cardiac: RR, without RMG Abdominal:  Soft, nontender, without masses, guarding, rebound, organomegaly or hernia Breasts:  Examined lying and sitting without masses, retractions, discharge or axillary adenopathy.  Well-healed left lumpectomy scar with distortion. Pelvic:  Ext, BUS, Vagina: With atrophic changes. 2 cm midline submucosal mass consistent with her history of submucosal cyst to finger breast within introital opening  Cervix: With atrophic changes flush with upper vagina  Uterus: Difficult to palpate but grossly normal size midline mobile nontender  Adnexa: Without masses or tenderness    Anus and perineum: Normal   Rectovaginal: Normal sphincter tone without palpated masses or tenderness.    Assessment/Plan:  83 y.o. [id number] female for breast and pelvic exam .   1. Suburethral cyst. Evaluated by urology in the past. Stable on exam. Not bothersome to the patient. Continue with annual exams in follow up. 2. History of left breast cancer status post lumpectomy. Exam NED. Mammography 10/2016. Continue with annual mammography when due. SBE monthly reviewed. 3. Colonoscopy 10 years ago. She reports  that her and Dr. Joylene Draft had discussed this and they decided against further screening. 4. Osteoporosis. Reports recent DEXA at Dr. Silvestre Mesi office. We'll continue to follow up with them in reference to bone health. 5. Pap smear 2012. No Pap smear done today. No history of abnormal Pap smears. Per current screening guidelines we both agree to stop screening based on age. 6. Health maintenance. No routine lab work done as this is done at Dr. Silvestre Mesi office. Follow up 1 year, sooner as needed.   [name] MD, 10:47 AM 03/09/2017

## 2017-03-09 NOTE — Patient Instructions (Signed)

## 2017-03-10 ENCOUNTER — Telehealth: Payer: Self-pay

## 2017-03-10 NOTE — Telephone Encounter (Signed)
Pt called requesting a call back from [place] NP.  Called pt back and LVM Mendel Ryder is gone for the weekend. She will be back in clinic Monday morning.

## 2017-03-13 ENCOUNTER — Telehealth: Payer: Self-pay | Admitting: *Deleted

## 2017-03-13 NOTE — Telephone Encounter (Signed)
Patient called asking if she currently has (L) breast cancer, unsure if previously cancer was noted in the left or right breast. Per Mendel Ryder, NP reviewed original pathology noted in 2003, cancer was in left breast, and has since been treated. Patient is currently without evidence of disease or recurrence of breast cancer. Patient verbalized understanding, and expressed appreciation of the clarification.

## 2017-09-07 DIAGNOSIS — Z23 Encounter for immunization: Secondary | ICD-10-CM | POA: Diagnosis not present

## 2017-09-11 ENCOUNTER — Other Ambulatory Visit: Payer: Self-pay | Admitting: Adult Health

## 2017-09-11 DIAGNOSIS — Z853 Personal history of malignant neoplasm of breast: Secondary | ICD-10-CM

## 2017-09-11 DIAGNOSIS — Z1231 Encounter for screening mammogram for malignant neoplasm of breast: Secondary | ICD-10-CM

## 2017-11-08 ENCOUNTER — Ambulatory Visit
Admission: RE | Admit: 2017-11-08 | Discharge: 2017-11-08 | Disposition: A | Discharge status: Home / Self Care | Payer: Medicare HMO | Source: Ambulatory Visit | Attending: Adult Health | Admitting: Adult Health

## 2017-11-08 DIAGNOSIS — Z853 Personal history of malignant neoplasm of breast: Secondary | ICD-10-CM

## 2017-11-08 DIAGNOSIS — Z1231 Encounter for screening mammogram for malignant neoplasm of breast: Secondary | ICD-10-CM | POA: Diagnosis not present

## 2017-11-09 ENCOUNTER — Other Ambulatory Visit: Payer: Self-pay | Admitting: Adult Health

## 2017-11-09 ENCOUNTER — Other Ambulatory Visit: Payer: Self-pay

## 2017-11-09 DIAGNOSIS — C50412 Malignant neoplasm of upper-outer quadrant of left female breast: Secondary | ICD-10-CM

## 2017-11-09 DIAGNOSIS — Z170 Estrogen receptor positive status [ER+]: Principal | ICD-10-CM

## 2017-11-09 DIAGNOSIS — R928 Other abnormal and inconclusive findings on diagnostic imaging of breast: Secondary | ICD-10-CM

## 2017-11-15 ENCOUNTER — Ambulatory Visit
Admission: RE | Admit: 2017-11-15 | Discharge: 2017-11-15 | Disposition: A | Discharge status: Home / Self Care | Payer: Medicare HMO | Source: Ambulatory Visit | Attending: Adult Health | Admitting: Adult Health

## 2017-11-15 ENCOUNTER — Other Ambulatory Visit: Payer: Self-pay | Admitting: Adult Health

## 2017-11-15 DIAGNOSIS — N6489 Other specified disorders of breast: Secondary | ICD-10-CM | POA: Diagnosis not present

## 2017-11-15 DIAGNOSIS — R928 Other abnormal and inconclusive findings on diagnostic imaging of breast: Secondary | ICD-10-CM

## 2017-11-15 DIAGNOSIS — R922 Inconclusive mammogram: Secondary | ICD-10-CM | POA: Diagnosis not present

## 2017-11-22 ENCOUNTER — Other Ambulatory Visit: Payer: Medicare HMO

## 2017-11-29 ENCOUNTER — Ambulatory Visit
Admission: RE | Admit: 2017-11-29 | Discharge: 2017-11-29 | Disposition: A | Discharge status: Home / Self Care | Payer: Medicare HMO | Source: Ambulatory Visit | Attending: Adult Health | Admitting: Adult Health

## 2017-11-29 ENCOUNTER — Other Ambulatory Visit: Payer: Self-pay | Admitting: Adult Health

## 2017-11-29 DIAGNOSIS — R928 Other abnormal and inconclusive findings on diagnostic imaging of breast: Secondary | ICD-10-CM

## 2017-11-29 DIAGNOSIS — N6314 Unspecified lump in the right breast, lower inner quadrant: Secondary | ICD-10-CM | POA: Diagnosis not present

## 2017-11-29 DIAGNOSIS — D241 Benign neoplasm of right breast: Secondary | ICD-10-CM | POA: Diagnosis not present

## 2017-11-29 DIAGNOSIS — N6310 Unspecified lump in the right breast, unspecified quadrant: Secondary | ICD-10-CM

## 2017-11-29 DIAGNOSIS — N6313 Unspecified lump in the right breast, lower outer quadrant: Secondary | ICD-10-CM | POA: Diagnosis not present

## 2017-11-29 DIAGNOSIS — C50311 Malignant neoplasm of lower-inner quadrant of right female breast: Secondary | ICD-10-CM | POA: Diagnosis not present

## 2017-12-10 ENCOUNTER — Encounter: Payer: Self-pay | Admitting: General Surgery

## 2017-12-11 DIAGNOSIS — Z803 Family history of malignant neoplasm of breast: Secondary | ICD-10-CM | POA: Diagnosis not present

## 2017-12-11 DIAGNOSIS — Z853 Personal history of malignant neoplasm of breast: Secondary | ICD-10-CM | POA: Diagnosis not present

## 2017-12-11 DIAGNOSIS — C50111 Malignant neoplasm of central portion of right female breast: Secondary | ICD-10-CM | POA: Diagnosis not present

## 2017-12-12 DIAGNOSIS — Z923 Personal history of irradiation: Secondary | ICD-10-CM

## 2017-12-12 HISTORY — DX: Personal history of irradiation: Z92.3

## 2017-12-18 ENCOUNTER — Telehealth: Payer: Self-pay | Admitting: Hematology and Oncology

## 2017-12-18 NOTE — Telephone Encounter (Signed)
Called patient to tried to schedule appt per 1/2 sch message - Patient did not understand why she needed an appt with Dr. Lindi Adie - said she never heard of Dr. Lindi Adie and wanted to know why she would need an appt. Transferred patient to Rn navigator line , told her to leave her name, number and date of birth  And question. No appt scheduled or cancelled yet.

## 2017-12-21 ENCOUNTER — Telehealth: Payer: Self-pay | Admitting: Oncology

## 2017-12-21 DIAGNOSIS — Z853 Personal history of malignant neoplasm of breast: Secondary | ICD-10-CM | POA: Diagnosis not present

## 2017-12-21 DIAGNOSIS — C50111 Malignant neoplasm of central portion of right female breast: Secondary | ICD-10-CM | POA: Diagnosis not present

## 2017-12-21 DIAGNOSIS — Z803 Family history of malignant neoplasm of breast: Secondary | ICD-10-CM | POA: Diagnosis not present

## 2017-12-21 NOTE — Telephone Encounter (Signed)
Left message on voicemail for patient regarding appointment D/T/Loc/Ph# °

## 2017-12-22 NOTE — Progress Notes (Signed)
Location of Breast Cancer: Right Breast  Histology per Pathology Report:  11/29/17 Diagnosis 1. Breast, right, needle core biopsy, 4:00 o'clock - INVASIVE DUCTAL CARCINOMA. - DUCTAL CARCINOMA IN SITU WITH FOCAL NECROSIS. 2. Breast, right, needle core biopsy, 7:00 o'clock - FIBROADENOMA. - NO EVIDENCE OF MALIGNANCY  Receptor Status: ER(100%), PR (100%), Her2-neu (NEG), Ki-(2%)  Did patient present with symptoms or was this found on screening mammography?: It was found on a screening mammogram.   Past/Anticipated interventions by surgeon, if any: Dr. Dalbert Batman plans lumpectomy. It is not scheduled at this time.   Past/Anticipated interventions by medical oncology, if any: Dr. Lindi Adie today at 8:30: Recommendations: 1. Breast conserving surgery followed by 2. Adjuvant radiation therapy followed by 3. Adjuvant antiestrogen therapy   Lymphedema issues, if any:  N/A  Pain issues, if any: She    SAFETY ISSUES:  Prior radiation? Yes, Left Breast 11/2002- 12/2002  Pacemaker/ICD? No  Possible current pregnancy? No  Is the patient on methotrexate? No  Current Complaints / other details:    07/01/2002 Surgery Left breast lumpectomy, stage 2 invasive ductal carcinoma with DCIS ER positive PR positive HER-2 -3.4 cm tumor T2 N0 M0    08/06/2002 - 11/06/2002 Chemotherapy Adjuvant chemotherapy with CMF 6   01/29/2003 - 01/06/2009 Anti-estrogen oral therapy Tamoxifen for 1 year then Arimidex 1 mg daily   12/06/2012 - 12/31/2012 Radiation Therapy Adjuvant radiation therapy   ? If radiation in 2013- 2014 is accurate. Will ask patient when she is here for consult. The patient tells me that she received radiation in 2003 into 2004 to her Left Breast.   BP 138/72   Pulse 87   Temp 97.7 F (36.5 C)   Ht 5' 2.25" (1.581 m)   Wt 139 lb (63 kg)   SpO2 100% Comment: room air  BMI 25.22 kg/m    Wt Readings from Last 3 Encounters:  12/26/17 139 lb (63 kg)  12/26/17 139 lb 6.4 oz (63.2 kg)   03/09/17 144 lb (65.3 kg)      Anuj Summons, Stephani Police, RN 12/22/2017,10:00 AM

## 2017-12-25 DIAGNOSIS — E785 Hyperlipidemia, unspecified: Secondary | ICD-10-CM | POA: Diagnosis not present

## 2017-12-25 DIAGNOSIS — K08409 Partial loss of teeth, unspecified cause, unspecified class: Secondary | ICD-10-CM | POA: Diagnosis not present

## 2017-12-25 DIAGNOSIS — R69 Illness, unspecified: Secondary | ICD-10-CM | POA: Diagnosis not present

## 2017-12-25 DIAGNOSIS — Z7982 Long term (current) use of aspirin: Secondary | ICD-10-CM | POA: Diagnosis not present

## 2017-12-25 DIAGNOSIS — I10 Essential (primary) hypertension: Secondary | ICD-10-CM | POA: Diagnosis not present

## 2017-12-25 DIAGNOSIS — C50919 Malignant neoplasm of unspecified site of unspecified female breast: Secondary | ICD-10-CM | POA: Diagnosis not present

## 2017-12-25 DIAGNOSIS — K219 Gastro-esophageal reflux disease without esophagitis: Secondary | ICD-10-CM | POA: Diagnosis not present

## 2017-12-26 ENCOUNTER — Ambulatory Visit
Admission: RE | Admit: 2017-12-26 | Discharge: 2017-12-26 | Disposition: A | Discharge status: Home / Self Care | Payer: Medicare HMO | Source: Ambulatory Visit | Attending: Radiation Oncology | Admitting: Radiation Oncology

## 2017-12-26 ENCOUNTER — Encounter: Payer: Self-pay | Admitting: Radiation Oncology

## 2017-12-26 ENCOUNTER — Inpatient Hospital Stay: Payer: Medicare HMO | Attending: Oncology | Admitting: Hematology and Oncology

## 2017-12-26 VITALS — BP 138/72 | HR 87 | Temp 97.7°F | Ht 62.25 in | Wt 139.0 lb

## 2017-12-26 DIAGNOSIS — M419 Scoliosis, unspecified: Secondary | ICD-10-CM | POA: Insufficient documentation

## 2017-12-26 DIAGNOSIS — Z170 Estrogen receptor positive status [ER+]: Secondary | ICD-10-CM

## 2017-12-26 DIAGNOSIS — Z7982 Long term (current) use of aspirin: Secondary | ICD-10-CM | POA: Diagnosis not present

## 2017-12-26 DIAGNOSIS — Z803 Family history of malignant neoplasm of breast: Secondary | ICD-10-CM | POA: Insufficient documentation

## 2017-12-26 DIAGNOSIS — E785 Hyperlipidemia, unspecified: Secondary | ICD-10-CM | POA: Insufficient documentation

## 2017-12-26 DIAGNOSIS — C50211 Malignant neoplasm of upper-inner quadrant of right female breast: Secondary | ICD-10-CM | POA: Diagnosis not present

## 2017-12-26 DIAGNOSIS — Z79899 Other long term (current) drug therapy: Secondary | ICD-10-CM | POA: Diagnosis not present

## 2017-12-26 DIAGNOSIS — K219 Gastro-esophageal reflux disease without esophagitis: Secondary | ICD-10-CM | POA: Diagnosis not present

## 2017-12-26 DIAGNOSIS — Z923 Personal history of irradiation: Secondary | ICD-10-CM | POA: Insufficient documentation

## 2017-12-26 DIAGNOSIS — Z853 Personal history of malignant neoplasm of breast: Secondary | ICD-10-CM | POA: Diagnosis not present

## 2017-12-26 DIAGNOSIS — C50412 Malignant neoplasm of upper-outer quadrant of left female breast: Secondary | ICD-10-CM

## 2017-12-26 DIAGNOSIS — I10 Essential (primary) hypertension: Secondary | ICD-10-CM | POA: Insufficient documentation

## 2017-12-26 NOTE — Assessment & Plan Note (Signed)
11/27/2017: Right breast biopsy 4 o'clock position: Grade 1-2 IDC with DCIS with necrosis, ER 100%, PR 100%, Ki-67 2%, HER-2 negative ratio 1.19; 7 o'clock position fibroadenoma; ultrasound detected 3 o'clock position 2 x 1.5 x 1.7 cm, T1CN0 stage I a clinical stage  (2003:Left breast lumpectomy, stage 2 invasive ductal carcinoma with DCIS ER positive PR positive HER-2 negative, 3.4 cm tumor, CMF x6 followed by radiation followed by tamoxifen and later anastrozole completed 2010)  Pathology and radiology counseling:Discussed with the patient, the details of pathology including the type of breast cancer,the clinical staging, the significance of ER, PR and HER-2/neu receptors and the implications for treatment. After reviewing the pathology in detail, we proceeded to discuss the different treatment options between surgery, radiation, chemotherapy, antiestrogen therapies.  Recommendations: 1. Breast conserving surgery followed by 2. Adjuvant radiation therapy followed by 3. Adjuvant antiestrogen therapy  Return to clinic after surgery to discuss final pathology report.

## 2017-12-26 NOTE — Progress Notes (Signed)
[name] CONSULT NOTE  Patient Care Team: Crist Infante, MD as PCP - General (Internal Medicine)  CHIEF COMPLAINTS/PURPOSE OF CONSULTATION:  Newly diagnosed right breast cancer  HISTORY OF PRESENTING ILLNESS:  Marie Alvarez 84 y.o. female is here because of recent diagnosis of right breast cancer.  Patient had previous history of left breast cancer treated with lumpectomy followed by adjuvant chemotherapy with CMF x6 followed by radiation and antiestrogen therapy initially with tamoxifen later switched to Arimidex and completed in 2010.  She presented with a mammographic abnormality in the right breast at 3 to 4 o'clock position measuring 2 cm in size.  There are no enlarged axillary lymph nodes of concern.  She underwent biopsy which revealed grade 1-2 invasive ductal carcinoma that was ER PR positive HER-2 negative with a Ki-67 of 2%.  She is here today to discuss her treatment plan.  I reviewed her records extensively and collaborated the history with the patient.  SUMMARY OF ONCOLOGIC HISTORY:   Malignant neoplasm of upper-outer quadrant of left breast in female, estrogen receptor positive ([place])   07/01/2002 Surgery    Left breast lumpectomy, stage 2 invasive ductal carcinoma with DCIS ER positive PR positive HER-2 negative, 3.4 cm tumor      07/01/2002 Pathologic Stage    Stage IIA: T2 N0      08/06/2002 - 11/06/2002 Chemotherapy    Adjuvant chemotherapy with CMF 6      12/06/2002 - 12/31/2002 Radiation Therapy    Adjuvant radiation therapy      01/29/2003 - 01/06/2009 Anti-estrogen oral therapy    Tamoxifen for 1 year then Arimidex 1 mg daily       Malignant neoplasm of upper-inner quadrant of right breast in female, estrogen receptor positive ([place])   11/29/2017 Initial Diagnosis    Right breast biopsy 4 o'clock position: Grade 1-2 IDC with DCIS with necrosis, ER 100%, PR 100%, Ki-67 2%, HER-2 negative ratio 1.19; 7 o'clock position fibroadenoma; ultrasound  detected 3 o'clock position 2 x 1.5 x 1.7 cm, T1CN0 stage I a clinical stage       MEDICAL HISTORY:  Past Medical History:  Diagnosis Date  . Cancer ([place]) 2003   BREAST-LEFT  . GERD (gastroesophageal reflux disease)   . Hx estrogen therapy 02/02/2012  . Hyperlipidemia   . Hypertension   . Osteoporosis   . Scoliosis   . Shingles   . Suburethral cyst 03/2012   Urologic evaluation recommended no special followup    SURGICAL HISTORY: Past Surgical History:  Procedure Laterality Date  . APPENDECTOMY  1946  . BREAST LUMPECTOMY Left 2003  . BREAST SURGERY  07-2002   LEFT LUMPECTOMY  . TONSILECTOMY, ADENOIDECTOMY, BILATERAL MYRINGOTOMY AND TUBES      SOCIAL HISTORY: Social History   Socioeconomic History  . Marital status: Widowed    Spouse name: Not on file  . Number of children: 2  . Years of education: Not on file  . Highest education level: Not on file  Social Needs  . Financial resource strain: Not on file  . Food insecurity - worry: Not on file  . Food insecurity - inability: Not on file  . Transportation needs - medical: Not on file  . Transportation needs - non-medical: Not on file  Occupational History  . Not on file  Tobacco Use  . Smoking status: Never Smoker  . Smokeless tobacco: Never Used  Substance and Sexual Activity  . Alcohol use: No    Alcohol/week: 0.0  oz  . Drug use: No  . Sexual activity: No    Birth control/protection: Post-menopausal    Comment: 1st intercourse 31 yo--1 partner  Other Topics Concern  . Not on file  Social History Narrative  . Not on file    FAMILY HISTORY: Family History  Problem Relation Age of Onset  . Heart disease Mother        Angina  . Heart disease Father        Unknown    ALLERGIES:  is allergic to prednisone and sulfamethoxazole.  MEDICATIONS:  Current Outpatient Medications  Medication Sig Dispense Refill  . amLODipine (NORVASC) 5 MG tablet Take 2.5 mg by mouth 2 (two) times daily.     Marland Kitchen aspirin 81  MG tablet Take 81 mg by mouth 3 (three) times a week.     Marland Kitchen CALCIUM PO Take by mouth daily.    . Cholecalciferol (VITAMIN D-3 PO) Take by mouth.    . Cyanocobalamin (VITAMIN B 12 PO) Take by mouth.    . loratadine (CLARITIN) 10 MG tablet Take 10 mg by mouth daily.    Marland Kitchen NORTRIPTYLINE HCL PO Take 10 mg by mouth at bedtime.     . Pantoprazole Sodium (PROTONIX PO) Take 40 mg by mouth daily.     Marland Kitchen PRAVASTATIN SODIUM PO Take 40 mg by mouth daily.     . DENTA 5000 PLUS 1.1 % CREA dental cream USE AS DIRECTED ONCE A DAY  3  . fish oil-omega-3 fatty acids 1000 MG capsule Take 2 g by mouth daily.    . irbesartan (AVAPRO) 150 MG tablet Take 150 mg by mouth daily.     No current facility-administered medications for this visit.     REVIEW OF SYSTEMS:   Constitutional: Denies fevers, chills or abnormal night sweats Eyes: Denies blurriness of vision, double vision or watery eyes Ears, nose, mouth, throat, and face: Denies mucositis or sore throat Respiratory: Denies cough, dyspnea or wheezes Cardiovascular: Denies palpitation, chest discomfort or lower extremity swelling Gastrointestinal:  Denies nausea, heartburn or change in bowel habits Skin: Denies abnormal skin rashes Lymphatics: Denies new lymphadenopathy or easy bruising Neurological:Denies numbness, tingling or new weaknesses Behavioral/Psych: Mood is stable, no new changes  Breast:  Denies any palpable lumps or discharge All other systems were reviewed with the patient and are negative.  PHYSICAL EXAMINATION: ECOG PERFORMANCE STATUS: 1 - Symptomatic but completely ambulatory  Vitals:   12/26/17 0837  BP: (!) 132/96  Pulse: 85  Resp: 18  Temp: 97.8 F (36.6 C)  SpO2: 99%   Filed Weights   12/26/17 0837  Weight: 139 lb 6.4 oz (63.2 kg)    GENERAL:alert, no distress and comfortable SKIN: skin color, texture, turgor are normal, no rashes or significant lesions EYES: normal, conjunctiva are pink and non-injected, sclera  clear OROPHARYNX:no exudate, no erythema and lips, buccal mucosa, and tongue normal  NECK: supple, thyroid normal size, non-tender, without nodularity LYMPH:  no palpable lymphadenopathy in the cervical, axillary or inguinal LUNGS: clear to auscultation and percussion with normal breathing effort HEART: regular rate & rhythm and no murmurs and no lower extremity edema ABDOMEN:abdomen soft, non-tender and normal bowel sounds Musculoskeletal:no cyanosis of digits and no clubbing  PSYCH: alert & oriented x 3 with fluent speech NEURO: no focal motor/sensory deficits BREAST: No palpable nodules in breast. No palpable axillary or supraclavicular lymphadenopathy (exam performed in the presence of a chaperone)   LABORATORY DATA:  I have reviewed the data as  listed Lab Results  Component Value Date   WBC 6.2 02/15/2017   HGB 14.9 02/15/2017   HCT 44.8 02/15/2017   MCV 90.0 02/15/2017   PLT 169 02/15/2017   Lab Results  Component Value Date   NA 140 02/15/2017   K 4.1 02/15/2017   CL 104 01/31/2013   CO2 29 02/15/2017    RADIOGRAPHIC STUDIES: I have personally reviewed the radiological reports and agreed with the findings in the report.  ASSESSMENT AND PLAN:  Malignant neoplasm of upper-inner quadrant of right breast in female, estrogen receptor positive ([place]) 11/27/2017: Right breast biopsy 4 o'clock position: Grade 1-2 IDC with DCIS with necrosis, ER 100%, PR 100%, Ki-67 2%, HER-2 negative ratio 1.19; 7 o'clock position fibroadenoma; ultrasound detected 3 o'clock position 2 x 1.5 x 1.7 cm, T1CN0 stage I a clinical stage  (2003:Left breast lumpectomy, stage 2 invasive ductal carcinoma with DCIS ER positive PR positive HER-2 negative, 3.4 cm tumor, CMF x6 followed by radiation followed by tamoxifen and later anastrozole completed 2010)  Pathology and radiology counseling:Discussed with the patient, the details of pathology including the type of breast cancer,the clinical staging, the  significance of ER, PR and HER-2/neu receptors and the implications for treatment. After reviewing the pathology in detail, we proceeded to discuss the different treatment options between surgery, radiation, chemotherapy, antiestrogen therapies.  Recommendations: 1. Breast conserving surgery followed by 2. Adjuvant radiation therapy followed by 3. Adjuvant antiestrogen therapy  Return to clinic after surgery to discuss final pathology report.  All questions were answered. The patient knows to call the clinic with any problems, questions or concerns.    Harriette Ohara, MD 12/26/17

## 2017-12-26 NOTE — Progress Notes (Signed)
Radiation Oncology         [phone] ________________________________  Initial outpatient Consultation  Name: Marie Alvarez MRN: [id number]  Date: 12/26/2017  DOB: Sep 19, 1931  EN:IDPOEU, Elta Guadeloupe, MD  Crist Infante, MD   REFERRING PHYSICIAN: Crist Infante, MD  DIAGNOSIS:    ICD-10-CM   1. 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 female with a previous history of left breast cancer treated in 2003 with lumpectomy, chemotherapy, radiation, and tamoxifen (and later anastrozole) completed in 2010.  She presented with a mammographic abnormality in the right breast at 3 o'clock measuring 2 x 1.5 x 1.7 cm with no enlarged axillary nodes. Biopsy on 12/19/18showed grade I-II invasive ductal carcinoma and DCIS with characteristics as described above in the diagnosis.  She is followed by Dr. Dalbert Batman who anticipates a lumpectomy. She met with Dr. Lindi Adie this morning and has been kindly referred to radiation oncology to discuss the potential role of radiation therapy in the management of her cancer.  On review of systems, patient states she tolerated antiestrogen therapy well. Patient states two nieces and two maternal cousins have had breast cancer. She has multiple sisters, none w/ Ovarian or Breast cancer. Mother did not have ovarian or breast cancer.  PREVIOUS RADIATION THERAPY: Yes, left breast: 12/06/2002 - 12/31/2002  PAST MEDICAL HISTORY:  has a past medical history of Cancer ([place]) (2003), GERD (gastroesophageal reflux disease), estrogen therapy (02/02/2012), Hyperlipidemia, Hypertension, Osteoporosis, Scoliosis, Shingles, and Suburethral cyst (03/2012).    PAST SURGICAL HISTORY: Past  Surgical History:  Procedure Laterality Date  . APPENDECTOMY  1946  . BREAST LUMPECTOMY Left 2003  . BREAST SURGERY  07-2002   LEFT LUMPECTOMY  . TONSILECTOMY, ADENOIDECTOMY, BILATERAL MYRINGOTOMY AND TUBES      FAMILY HISTORY: family history includes Heart disease in her father and mother.  SOCIAL HISTORY:  reports that  has never smoked. she has never used smokeless tobacco. She reports that she does not drink alcohol or use drugs.  ALLERGIES: Prednisone and Sulfamethoxazole  MEDICATIONS:  Current Outpatient Medications  Medication Sig Dispense Refill  . amLODipine (NORVASC) 5 MG tablet Take 2.5 mg by mouth 2 (two) times daily.     Marland Kitchen CALCIUM PO Take by mouth daily.    . Cholecalciferol (VITAMIN D-3 PO) Take by mouth.    . Cyanocobalamin (VITAMIN B 12 PO) Take by mouth.    . irbesartan (AVAPRO) 150 MG tablet Take 150 mg by mouth daily.    Marland Kitchen loratadine (CLARITIN) 10 MG tablet Take 10 mg by mouth daily.    Marland Kitchen NORTRIPTYLINE HCL PO Take 10 mg by mouth at bedtime.     . Pantoprazole Sodium (PROTONIX PO) Take 40 mg by mouth daily.     Marland Kitchen PRAVASTATIN SODIUM PO Take 40 mg by mouth daily.     Marland Kitchen aspirin 81 MG tablet Take 81 mg by mouth 3 (three) times a week.     . DENTA 5000 PLUS 1.1 % CREA dental cream USE AS DIRECTED ONCE A DAY  3  . fish oil-omega-3 fatty acids 1000 MG capsule Take 2 g by mouth daily.     No current facility-administered medications for this encounter.  REVIEW OF SYSTEMS: as above   PHYSICAL EXAM:  height is 5' 2.25" (1.581 m) and weight is 139 lb (63 kg). Her temperature is 97.7 F (36.5 C). Her blood pressure is 138/72 and her pulse is 87. Her oxygen saturation is 100%.   General: Alert and oriented, in no acute distress HEENT: Head is normocephalic. Extraocular movements are intact. Oropharynx is dry but clear. Neck: Neck is supple, no palpable cervical or supraclavicular lymphadenopathy. Heart: Regular in rate and rhythm with no murmurs, rubs, or  gallops. Chest: Clear to auscultation bilaterally, with no rhonchi, wheezes, or rales. Abdomen: Soft, nontender, nondistended, with no rigidity or guarding. Extremities: +LE edema. Lymphatics: see Neck Exam Skin: No concerning lesions. Musculoskeletal: symmetric strength and muscle tone throughout. Neurologic: Cranial nerves II through XII are grossly intact. No obvious focalities. Speech is fluent. Coordination is intact. Psychiatric: Judgment and insight are intact. Affect is appropriate. Breasts: Palpable  Mass, approx 2cm, at 3:00 of right breast. Left breast lumpectomy scar spans from UOQ to LOQ. No palpable masses appreciated in  the axillae bilaterally.   ECOG = 1  0 - Asymptomatic (Fully active, able to carry on all predisease activities without restriction)  1 - Symptomatic but completely ambulatory (Restricted in physically strenuous activity but ambulatory and able to carry out work of a light or sedentary nature. For example, light housework, office work)  2 - Symptomatic, <50% in bed during the day (Ambulatory and capable of all self care but unable to carry out any work activities. Up and about more than 50% of waking hours)  3 - Symptomatic, >50% in bed, but not bedbound (Capable of only limited self-care, confined to bed or chair 50% or more of waking hours)  4 - Bedbound (Completely disabled. Cannot carry on any self-care. Totally confined to bed or chair)  5 - Death   Eustace Pen MM, Creech RH, Tormey DC, et al. [phone]). "Toxicity and response criteria of the [facility]". [place] Oncol. 5 (6): 649-55   LABORATORY DATA:  Lab Results  Component Value Date   WBC 6.2 02/15/2017   HGB 14.9 02/15/2017   HCT 44.8 02/15/2017   MCV 90.0 02/15/2017   PLT 169 02/15/2017   CMP     Component Value Date/Time   NA 140 02/15/2017 0948   K 4.1 02/15/2017 0948   CL 104 01/31/2013 1358   CO2 29 02/15/2017 0948   GLUCOSE 93 02/15/2017 0948   GLUCOSE 95  01/31/2013 1358   BUN 12.5 02/15/2017 0948   CREATININE 0.8 02/15/2017 0948   CALCIUM 10.5 (H) 02/15/2017 0948   PROT 7.2 02/15/2017 0948   ALBUMIN 4.3 02/15/2017 0948   AST 16 02/15/2017 0948   ALT 11 02/15/2017 0948   ALKPHOS 75 02/15/2017 0948   BILITOT 0.70 02/15/2017 0948         RADIOGRAPHY: Mm Clip Placement Right  Result Date: 11/29/2017 CLINICAL DATA:  Status post ultrasound-guided biopsies of 2 masses within the right breast. EXAM: DIAGNOSTIC RIGHT MAMMOGRAM POST ULTRASOUND BIOPSY COMPARISON:  Previous exam(s). FINDINGS: Mammographic images were obtained following ultrasound guided biopsy of the right breast mass at the 4 o'clock axis followed by ultrasound-guided biopsy of the right breast mass at the 7 o'clock axis. Both clips appear appropriately position. IMPRESSION: 1. Ribbon shaped biopsy clip is appropriately positioned within the lower inner quadrant of the right breast, corresponding to the targeted mass at the 4 o'clock axis. 2. Coil shaped biopsy clip is appropriately position  within the lower outer quadrant of the right breast, corresponding to the targeted mass at the 7 o'clock axis. Final Assessment: Post Procedure Mammograms for Marker Placement Electronically Signed   By: Franki Cabot M.D.   On: 11/29/2017 15:07   [place] Rt Breast Bx W Loc Dev 1st Lesion Img Bx Spec US Guide  Addendum Date: 11/30/2017   ADDENDUM REPORT: 11/30/2017 14:12 ADDENDUM: Pathology revealed GRADE I-II INVASIVE DUCTAL CARCINOMA, DUCTAL CARCINOMA IN SITU WITH FOCAL NECROSIS of the Right breast, 4:00 o'clock. FIBROADENOMA of the Right breast, 7:00 o'clock. This was found to be concordant by Dr. Franki Cabot. Pathology results were discussed with the patient by telephone. The patient reported doing well after the biopsies with tenderness and minimal bleeding at the sites. Post biopsy instructions and care were reviewed and questions were answered. The patient was encouraged to call [facility] for any additional concerns. Surgical consultation has been arranged with Dr. Fanny Skates at [facility] on December 11, 2017. Pathology results reported by Terie Purser, RN on 11/30/2017. Electronically Signed   By: Franki Cabot M.D.   On: 11/30/2017 14:12   Result Date: 11/30/2017 CLINICAL DATA:  Patient with a suspicious mass within the inner right breast presents today for ultrasound-guided core biopsy. EXAM: ULTRASOUND GUIDED RIGHT BREAST CORE NEEDLE BIOPSY COMPARISON:  Previous exam(s). PROCEDURE: I met with the patient and we discussed the procedure of ultrasound-guided biopsy, including benefits and alternatives. We discussed the high likelihood of a successful procedure. We discussed the risks of the procedure including infection, bleeding, tissue injury, clip migration, and inadequate sampling. Informed written consent was given. The usual time-out protocol was performed immediately prior to the procedure. + On preprocedure planning ultrasound evaluation, the mass was localized to the 4 o'clock axis (described as 3 o'clock axis on the diagnostic report). An additional hypoechoic mass was identified today in the right breast at the 7 o'clock axis, 1 cm from the nipple, for which ultrasound-guided biopsy will also be performed today. 1. Using sterile technique and 1% Lidocaine as local anesthetic, under direct ultrasound visualization, a 12 gauge vacuum-assisted device was used to perform biopsy of the suspicious mass in the right breast at the 4 o'clock axisusing a inferior approach. At the conclusion of the procedure, a ribbon shaped tissue marker clip was deployed into the biopsy cavity. Lesion quadrant: Lower inner quadrant 2. Next, using sterile technique and 1% lidocaine as local anesthetic, under direct ultrasound visualization, a 12 gauge vacuum assisted device was used to perform biopsy of the additional hypoechoic mass in the right breast at the 7 o'clock  axis using a inferior approach. At the conclusion of the procedure, a coil shaped tissue marker clip was deployed into the biopsy cavity. Lesion quadrant, lower outer quadrant Follow-up 2-view mammogram was performed and dictated separately. IMPRESSION: 1. Ultrasound-guided biopsy of the suspicious mass within the right breast at the 4 o'clock axis. No apparent complications. 2. Ultrasound-guided biopsy of the additional hypoechoic mass within the right breast at the 7 o'clock axis. Electronically Signed: By: Franki Cabot M.D. On: 11/29/2017 15:06   [place] Rt Breast Bx W Loc Dev Ea Add Lesion Img Bx Spec US Guide  Addendum Date: 11/30/2017   ADDENDUM REPORT: 11/30/2017 14:12 ADDENDUM: Pathology revealed GRADE I-II INVASIVE DUCTAL CARCINOMA, DUCTAL CARCINOMA IN SITU WITH FOCAL NECROSIS of the Right breast, 4:00 o'clock. FIBROADENOMA of the Right breast, 7:00 o'clock. This was found to be concordant by Dr. Franki Cabot. Pathology  results were discussed with the patient by telephone. The patient reported doing well after the biopsies with tenderness and minimal bleeding at the sites. Post biopsy instructions and care were reviewed and questions were answered. The patient was encouraged to call [facility] for any additional concerns. Surgical consultation has been arranged with Dr. Fanny Skates at [facility] Surgery on December 11, 2017. Pathology results reported by Terie Purser, RN on 11/30/2017. Electronically Signed   By: Franki Cabot M.D.   On: 11/30/2017 14:12   Result Date: 11/30/2017 CLINICAL DATA:  Patient with a suspicious mass within the inner right breast presents today for ultrasound-guided core biopsy. EXAM: ULTRASOUND GUIDED RIGHT BREAST CORE NEEDLE BIOPSY COMPARISON:  Previous exam(s). PROCEDURE: I met with the patient and we discussed the procedure of ultrasound-guided biopsy, including benefits and alternatives. We discussed the high likelihood of a successful  procedure. We discussed the risks of the procedure including infection, bleeding, tissue injury, clip migration, and inadequate sampling. Informed written consent was given. The usual time-out protocol was performed immediately prior to the procedure. + On preprocedure planning ultrasound evaluation, the mass was localized to the 4 o'clock axis (described as 3 o'clock axis on the diagnostic report). An additional hypoechoic mass was identified today in the right breast at the 7 o'clock axis, 1 cm from the nipple, for which ultrasound-guided biopsy will also be performed today. 1. Using sterile technique and 1% Lidocaine as local anesthetic, under direct ultrasound visualization, a 12 gauge vacuum-assisted device was used to perform biopsy of the suspicious mass in the right breast at the 4 o'clock axisusing a inferior approach. At the conclusion of the procedure, a ribbon shaped tissue marker clip was deployed into the biopsy cavity. Lesion quadrant: Lower inner quadrant 2. Next, using sterile technique and 1% lidocaine as local anesthetic, under direct ultrasound visualization, a 12 gauge vacuum assisted device was used to perform biopsy of the additional hypoechoic mass in the right breast at the 7 o'clock axis using a inferior approach. At the conclusion of the procedure, a coil shaped tissue marker clip was deployed into the biopsy cavity. Lesion quadrant, lower outer quadrant Follow-up 2-view mammogram was performed and dictated separately. IMPRESSION: 1. Ultrasound-guided biopsy of the suspicious mass within the right breast at the 4 o'clock axis. No apparent complications. 2. Ultrasound-guided biopsy of the additional hypoechoic mass within the right breast at the 7 o'clock axis. Electronically Signed: By: Franki Cabot M.D. On: 11/29/2017 15:06      IMPRESSION/PLAN: right breast cancer, ER+, T1c vs T2 N0  It was a pleasure meeting the patient today. We discussed the risks, benefits, and side effects of  radiotherapy. I recommend radiotherapy to the right breast to reduce her risk of locoregional recurrence by 2/3 but would not impact life expectancy.  We also discussed the option of anti estrogen pills alone for adjuvant therapy, but she would like to pursue RT as part of her care.  We discussed that radiation would take approximately 3.5 to 4 weeks to complete and that I would give the patient a few weeks to heal following surgery before starting treatment planning. We spoke about acute effects including skin irritation and fatigue as well as much less common late effects including internal organ injury or irritation. We spoke about the latest technology that is used to minimize the risk of late effects for patients undergoing radiotherapy to the breast or chest wall. No guarantees of treatment were given. The patient is  enthusiastic about proceeding with treatment. I look forward to participating in the patient's care.  I will await her referral back to me for postoperative follow-up and eventual CT simulation/treatment planning.   I spent more than 45 minutes  face to face with the patient and more than 50% of that time was spent in counseling and/or coordination of care.   __________________________________________   Eppie Gibson, MD   This document serves as a record of services personally performed by Eppie Gibson, MD. It was created on his behalf by Linward Natal, a trained medical scribe. The creation of this record is based on the scribe's personal observations and the provider's statements to them. This document has been checked and approved by the attending provider.

## 2017-12-27 ENCOUNTER — Telehealth: Payer: Self-pay | Admitting: Hematology and Oncology

## 2017-12-27 NOTE — Telephone Encounter (Signed)
No 11/5 los.  °

## 2018-01-01 ENCOUNTER — Ambulatory Visit: Payer: Medicare HMO | Admitting: Oncology

## 2018-01-01 ENCOUNTER — Other Ambulatory Visit: Payer: Medicare HMO

## 2018-01-04 ENCOUNTER — Other Ambulatory Visit: Payer: Self-pay | Admitting: General Surgery

## 2018-01-04 DIAGNOSIS — C50111 Malignant neoplasm of central portion of right female breast: Secondary | ICD-10-CM

## 2018-01-04 DIAGNOSIS — Z803 Family history of malignant neoplasm of breast: Secondary | ICD-10-CM | POA: Diagnosis not present

## 2018-01-04 DIAGNOSIS — Z853 Personal history of malignant neoplasm of breast: Secondary | ICD-10-CM | POA: Diagnosis not present

## 2018-01-05 NOTE — Pre-Procedure Instructions (Signed)
KYRIAKI MODER  01/05/2018      [id number] - [address], [address] [place] [address] Phone: [phone] Fax: [phone]  [facility] - [address], [address] [place] [address] Phone: [phone] Fax: [phone]    Your procedure is scheduled on January 12, 2018.  Report to [facility] Admitting at 750 AM.  Call this number if you have problems the morning of surgery:  [phone]   Remember:  Do not eat food or drink liquids after midnight.  Take these medicines the morning of surgery with A SIP OF WATER acetaminophen (tylenol)-if needed, amlodipine (norvasc), eye drops-if needed, loratadine (claritin), pantoprazole (protonix).   **Please finish drinking your Ensure pre-surgery drink before leaving your house the morning of surgery**  7 days prior to surgery STOP taking any Aspirin (unless otherwise instructed by your surgeon), Aleve, Naproxen, Ibuprofen, Motrin, Advil, Goody's, BC's, all herbal medications, fish oil, and all vitamins  Continue all other medications as instructed by your physician except follow the above medication instructions before surgery   Do not wear jewelry, make-up or nail polish.  Do not wear lotions, powders, or perfumes, or deodorant.  Do not shave 48 hours prior to surgery.  Men may shave face and neck.  Do not bring valuables to the hospital.  [facility] is not responsible for any belongings or valuables.  Contacts, dentures or bridgework may not be worn into surgery.  Leave your suitcase in the car.  After surgery it may be brought to your room.  For patients admitted to the hospital, discharge time will be determined by your treatment team.  Patients discharged the day of surgery will not be allowed to drive home.   Special instructions: [facility]- Preparing For Surgery  Before surgery, you can play an important role.  Because skin is not sterile, your skin needs to be as free of germs as possible. You can reduce the number of germs on your skin by washing with CHG (chlorahexidine gluconate) Soap before surgery.  CHG is an antiseptic cleaner which kills germs and bonds with the skin to continue killing germs even after washing.  Please do not use if you have an allergy to CHG or antibacterial soaps. If your skin becomes reddened/irritated stop using the CHG.  Do not shave (including legs and underarms) for at least 48 hours prior to first CHG shower. It is OK to shave your face.  Please follow these instructions carefully.   1. Shower the NIGHT BEFORE SURGERY and the MORNING OF SURGERY with CHG.   2. If you chose to wash your hair, wash your hair first as usual with your normal shampoo.  3. After you shampoo, rinse your hair and body thoroughly to remove the shampoo.  4. Use CHG as you would any other liquid soap. You can apply CHG directly to the skin and wash gently with a scrungie or a clean washcloth.   5. Apply the CHG Soap to your body ONLY FROM THE NECK DOWN.  Do not use on open wounds or open sores. Avoid contact with your eyes, ears, mouth and genitals (private parts). Wash Face and genitals (private parts)  with your normal soap.  6. Wash thoroughly, paying special attention to the area where your surgery will be performed.  7. Thoroughly rinse your body with warm water from the neck down.  8. DO NOT  shower/wash with your normal soap after using and rinsing off the CHG Soap.  9. Pat yourself dry with a CLEAN TOWEL.  10. Wear CLEAN PAJAMAS to bed the night before surgery, wear comfortable clothes the morning of surgery  11. Place CLEAN SHEETS on your bed the night of your first shower and DO NOT SLEEP WITH PETS.  Day of Surgery: Do not apply any deodorants/lotions. Please wear clean clothes to the hospital/surgery center.    Please read over the following fact sheets that you were  given.

## 2018-01-07 NOTE — H&P (Signed)
Marie Alvarez Location: [facility] Surgery Patient #: [id number] DOB: 07-26-1931 Widowed / Language: Marie Alvarez / Race: White Female       History of Present Illness      The patient is a 84 year old female who presents with breast cancer. This is a very pleasant 84 year old woman who returns with a family member to make final plans for her surgery for her right breast cancer. Dr. Joylene Draft is her PCP. Dr. Phineas Real is her gynecologist. She has now seen Dr. Lindi Adie and Dr. Isidore Moos at the cancer center      In 2003 she underwent left breast lumpectomy and sentinel node biopsy by Dr. March Rummage. She had a port and was given chemotherapy. Recently followed by Dr. Yancey Flemings now followed by nurse practitioner, health cancer center. No recurrence on the left. 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 A second biopsy of the right breast showed a benign fibroadenoma in the lower outer quadrant. The invasive cancer is strongly receptor positive and HER-2 negative.      Past history significant for hypertension, appendectomy, GERD, left breast cancer 2003. Chemotherapy and radiation therapy. Family history reveals 2 nieces with treated for breast cancer. One or 2 maternal cousins also had breast cancer. She is contemplating genetic testing. Social history reveals she is a widow living in [place]. 2 children. Denies alcohol or tobacco       She prefers breast conservation. I think she is a good candidate for that. Plan right breast lumpectomy with radioactive seed localization. Hopefully we can preserve the nipple and areola. I discussed the indications, details, techniques, and numerous risk of the surgery. She is aware the risks of bleeding, infection, nerve damage with chronic pain, permanent nipple numbness and flattening, cosmetic  deformity, reoperation for positive margins, and other unforeseen problems. She understands all these issues. All her questions were answered. She agrees with this plan.    Medication History  Medications Reconciled  Vitals  Weight: 140 lb Height: 60in Body Surface Area: 1.6 m Body Mass Index: 27.34 kg/m  Pulse: 96 (Regular)  BP: 162/82 (Sitting, Left Arm, Standard)       Physical Exam General Mental Status-Alert. General Appearance-Not in acute distress. Build & Nutrition-Well nourished. Posture-Normal posture. Gait-Normal.  Head and Neck Head-normocephalic, atraumatic with no lesions or palpable masses. Trachea-midline. Thyroid Gland Characteristics - normal size and consistency and no palpable nodules.  Chest and Lung Exam Chest and lung exam reveals -on auscultation, normal breath sounds, no adventitious sounds and normal vocal resonance.  Breast Note: Left breast reveals curvilinear scar at 3 o'clock position. No palpable mass adenopathy or other skin change. Breasts are small to medium size somewhat atrophic, appropriate for age. Right breast reveals that the ecchymoses are resolved. Tiny palpable mass just medial to the areolar margin. No other masses or skin changes.   Cardiovascular Cardiovascular examination reveals -normal heart sounds, regular rate and rhythm with no murmurs and femoral artery auscultation bilaterally reveals normal pulses, no bruits, no thrills.  Abdomen Inspection Inspection of the abdomen reveals - No Hernias. Palpation/Percussion Palpation and Percussion of the abdomen reveal - Soft, Non Tender, No Rigidity (guarding), No hepatosplenomegaly and No Palpable abdominal masses.  Neurologic Neurologic evaluation reveals -alert and oriented x 3 with no impairment of recent or remote memory, normal attention span and ability to concentrate,  normal sensation and normal  coordination.  Musculoskeletal Normal Exam - Bilateral-Upper Extremity Strength Normal and Lower Extremity Strength Normal.    Assessment & Plan  CANCER OF CENTRAL PORTION OF RIGHT BREAST (C50.111)  You have now seen Dr. Lindi Adie and Dr. Isidore Moos at the cancer center They agree with me that you should undergo right breast lumpectomy but that you do not need any lymph node surgery   you will be scheduled for right breast lumpectomy with radioactive seed localization I have discussed indications, details, and techniques, and risks of surgery in detail with you and your family member  It sounds like you will be offered adjuvant radiation therapy as well as adjuvant antiestrogen therapy thereafter. I agree  My office will set up the surgery and schedule the radioactive seed placement a day or 2 preop at [facility] (Z85.3) FAMILY HISTORY OF BREAST CANCER IN FEMALE (Z80.3)    Saron Tweed M. Dalbert Batman, M.D., [facility] General and Minimally invasive Surgery Breast and Colorectal Surgery Office:   [phone] Pager:   [phone]

## 2018-01-08 ENCOUNTER — Encounter: Payer: Self-pay | Admitting: Radiation Oncology

## 2018-01-08 ENCOUNTER — Encounter (HOSPITAL_COMMUNITY): Payer: Self-pay

## 2018-01-08 ENCOUNTER — Encounter (HOSPITAL_COMMUNITY)
Admission: RE | Admit: 2018-01-08 | Discharge: 2018-01-08 | Disposition: A | Discharge status: Home / Self Care | Payer: Medicare HMO | Source: Ambulatory Visit | Attending: General Surgery | Admitting: General Surgery

## 2018-01-08 ENCOUNTER — Telehealth: Payer: Self-pay | Admitting: Hematology and Oncology

## 2018-01-08 DIAGNOSIS — Z01810 Encounter for preprocedural cardiovascular examination: Secondary | ICD-10-CM | POA: Diagnosis not present

## 2018-01-08 DIAGNOSIS — Z01812 Encounter for preprocedural laboratory examination: Secondary | ICD-10-CM | POA: Diagnosis not present

## 2018-01-08 DIAGNOSIS — I10 Essential (primary) hypertension: Secondary | ICD-10-CM | POA: Diagnosis not present

## 2018-01-08 HISTORY — DX: Unspecified osteoarthritis, unspecified site: M19.90

## 2018-01-08 LAB — BASIC METABOLIC PANEL
ANION GAP: 9 (ref 5–15)
BUN: 16 mg/dL (ref 6–20)
CHLORIDE: 103 mmol/L (ref 101–111)
CO2: 25 mmol/L (ref 22–32)
Calcium: 9.7 mg/dL (ref 8.9–10.3)
Creatinine, Ser: 0.76 mg/dL (ref 0.44–1.00)
GFR calc non Af Amer: >60 mL/min (ref >60)
GLUCOSE: 102 mg/dL — AB (ref 65–99)
Potassium: 4.5 mmol/L (ref 3.5–5.1)
Sodium: 137 mmol/L (ref 135–145)

## 2018-01-08 LAB — CBC
HEMATOCRIT: 46.3 % — AB (ref 36.0–46.0)
HEMOGLOBIN: 15 g/dL (ref 12.0–15.0)
MCH: 29.8 pg (ref 26.0–34.0)
MCHC: 32.4 g/dL (ref 30.0–36.0)
MCV: 91.9 fL (ref 78.0–100.0)
Platelets: 179 10*3/uL (ref 150–400)
RBC: 5.04 MIL/uL (ref 3.87–5.11)
RDW: 13.3 % (ref 11.5–15.5)
WBC: 6 10*3/uL (ref 4.0–10.5)

## 2018-01-08 MED ORDER — CHLORHEXIDINE GLUCONATE CLOTH 2 % EX PADS: 6.0000 | MEDICATED_PAD | Freq: Once | CUTANEOUS | Status: DC

## 2018-01-08 NOTE — Telephone Encounter (Signed)
Left message for patient regarding upcoming February appointments per 1/24 sch message.

## 2018-01-10 ENCOUNTER — Ambulatory Visit
Admission: RE | Admit: 2018-01-10 | Discharge: 2018-01-10 | Disposition: A | Discharge status: Home / Self Care | Payer: Medicare HMO | Source: Ambulatory Visit | Attending: General Surgery | Admitting: General Surgery

## 2018-01-10 ENCOUNTER — Other Ambulatory Visit: Payer: Self-pay | Admitting: General Surgery

## 2018-01-10 DIAGNOSIS — C50311 Malignant neoplasm of lower-inner quadrant of right female breast: Secondary | ICD-10-CM | POA: Diagnosis not present

## 2018-01-10 DIAGNOSIS — C50111 Malignant neoplasm of central portion of right female breast: Secondary | ICD-10-CM

## 2018-01-12 ENCOUNTER — Ambulatory Visit (HOSPITAL_COMMUNITY): Payer: Medicare HMO | Admitting: Certified Registered"

## 2018-01-12 ENCOUNTER — Encounter (HOSPITAL_COMMUNITY)
Admission: RE | Disposition: A | Discharge status: Home / Self Care | Payer: Self-pay | Source: Ambulatory Visit | Attending: General Surgery

## 2018-01-12 ENCOUNTER — Encounter (HOSPITAL_COMMUNITY): Payer: Self-pay | Admitting: *Deleted

## 2018-01-12 ENCOUNTER — Ambulatory Visit
Admission: RE | Admit: 2018-01-12 | Discharge: 2018-01-12 | Disposition: A | Discharge status: Home / Self Care | Payer: Medicare HMO | Source: Ambulatory Visit | Attending: General Surgery | Admitting: General Surgery

## 2018-01-12 ENCOUNTER — Other Ambulatory Visit: Payer: Self-pay

## 2018-01-12 ENCOUNTER — Ambulatory Visit (HOSPITAL_COMMUNITY)
Admission: RE | Admit: 2018-01-12 | Discharge: 2018-01-12 | Disposition: A | Discharge status: Home / Self Care | Payer: Medicare HMO | Source: Ambulatory Visit | Attending: General Surgery | Admitting: General Surgery

## 2018-01-12 DIAGNOSIS — C50111 Malignant neoplasm of central portion of right female breast: Secondary | ICD-10-CM | POA: Insufficient documentation

## 2018-01-12 DIAGNOSIS — E785 Hyperlipidemia, unspecified: Secondary | ICD-10-CM | POA: Diagnosis not present

## 2018-01-12 DIAGNOSIS — Z803 Family history of malignant neoplasm of breast: Secondary | ICD-10-CM | POA: Diagnosis not present

## 2018-01-12 DIAGNOSIS — C50211 Malignant neoplasm of upper-inner quadrant of right female breast: Secondary | ICD-10-CM

## 2018-01-12 DIAGNOSIS — C50412 Malignant neoplasm of upper-outer quadrant of left female breast: Secondary | ICD-10-CM | POA: Diagnosis not present

## 2018-01-12 DIAGNOSIS — K219 Gastro-esophageal reflux disease without esophagitis: Secondary | ICD-10-CM | POA: Insufficient documentation

## 2018-01-12 DIAGNOSIS — I10 Essential (primary) hypertension: Secondary | ICD-10-CM | POA: Diagnosis not present

## 2018-01-12 DIAGNOSIS — Z170 Estrogen receptor positive status [ER+]: Secondary | ICD-10-CM

## 2018-01-12 DIAGNOSIS — C50911 Malignant neoplasm of unspecified site of right female breast: Secondary | ICD-10-CM | POA: Diagnosis not present

## 2018-01-12 DIAGNOSIS — Z79899 Other long term (current) drug therapy: Secondary | ICD-10-CM | POA: Diagnosis not present

## 2018-01-12 DIAGNOSIS — D241 Benign neoplasm of right breast: Secondary | ICD-10-CM | POA: Diagnosis not present

## 2018-01-12 DIAGNOSIS — R928 Other abnormal and inconclusive findings on diagnostic imaging of breast: Secondary | ICD-10-CM | POA: Diagnosis not present

## 2018-01-12 HISTORY — PX: BREAST LUMPECTOMY WITH RADIOACTIVE SEED LOCALIZATION: SHX6424

## 2018-01-12 HISTORY — PX: BREAST LUMPECTOMY: SHX2

## 2018-01-12 SURGERY — BREAST LUMPECTOMY WITH RADIOACTIVE SEED LOCALIZATION
Anesthesia: General | Site: Breast | Laterality: Right

## 2018-01-12 MED ORDER — ONDANSETRON HCL 4 MG/2ML IJ SOLN
4.0000 mg | Freq: Once | INTRAMUSCULAR | Status: AC | PRN
  Administered 2018-01-12: 4 mg via INTRAVENOUS

## 2018-01-12 MED ORDER — FENTANYL CITRATE (PF) 100 MCG/2ML IJ SOLN: 25.0000 ug | INTRAMUSCULAR | Status: DC | PRN

## 2018-01-12 MED ORDER — LACTATED RINGERS IV SOLN
INTRAVENOUS | Status: DC
  Administered 2018-01-12: 09:00:00 via INTRAVENOUS

## 2018-01-12 MED ORDER — CHLORHEXIDINE GLUCONATE CLOTH 2 % EX PADS: 6.0000 | MEDICATED_PAD | Freq: Once | CUTANEOUS | Status: DC

## 2018-01-12 MED ORDER — SODIUM CHLORIDE 0.9% FLUSH: 3.0000 mL | INTRAVENOUS | Status: DC | PRN

## 2018-01-12 MED ORDER — SODIUM CHLORIDE 0.9% FLUSH: 3.0000 mL | Freq: Two times a day (BID) | INTRAVENOUS | Status: DC

## 2018-01-12 MED ORDER — HYDROCODONE-ACETAMINOPHEN 5-325 MG PO TABS: 1.0000 | ORAL_TABLET | Freq: Four times a day (QID) | ORAL | 0 refills | Status: DC | PRN

## 2018-01-12 MED ORDER — ACETAMINOPHEN 160 MG/5ML PO SOLN: 325.0000 mg | ORAL | Status: DC | PRN

## 2018-01-12 MED ORDER — CEFAZOLIN SODIUM-DEXTROSE 2-4 GM/100ML-% IV SOLN
2.0000 g | INTRAVENOUS | Status: AC
  Administered 2018-01-12: 2 g via INTRAVENOUS
  Filled 2018-01-12: qty 100

## 2018-01-12 MED ORDER — PHENYLEPHRINE 40 MCG/ML (10ML) SYRINGE FOR IV PUSH (FOR BLOOD PRESSURE SUPPORT)
PREFILLED_SYRINGE | INTRAVENOUS | Status: AC
  Filled 2018-01-12: qty 10

## 2018-01-12 MED ORDER — OXYCODONE HCL 5 MG PO TABS: 5.0000 mg | ORAL_TABLET | ORAL | Status: DC | PRN

## 2018-01-12 MED ORDER — LACTATED RINGERS IV SOLN: INTRAVENOUS | Status: DC

## 2018-01-12 MED ORDER — EPHEDRINE SULFATE-NACL 50-0.9 MG/10ML-% IV SOSY
PREFILLED_SYRINGE | INTRAVENOUS | Status: DC | PRN
  Administered 2018-01-12 (×4): 10 mg via INTRAVENOUS
  Administered 2018-01-12: 5 mg via INTRAVENOUS

## 2018-01-12 MED ORDER — ONDANSETRON HCL 4 MG/2ML IJ SOLN
INTRAMUSCULAR | Status: DC | PRN
  Administered 2018-01-12: 4 mg via INTRAVENOUS

## 2018-01-12 MED ORDER — BUPIVACAINE-EPINEPHRINE (PF) 0.5% -1:200000 IJ SOLN
INTRAMUSCULAR | Status: AC
  Filled 2018-01-12: qty 30

## 2018-01-12 MED ORDER — ACETAMINOPHEN 650 MG RE SUPP: 650.0000 mg | RECTAL | Status: DC | PRN

## 2018-01-12 MED ORDER — MIDAZOLAM HCL 2 MG/2ML IJ SOLN
INTRAMUSCULAR | Status: AC
  Filled 2018-01-12: qty 2

## 2018-01-12 MED ORDER — ACETAMINOPHEN 325 MG PO TABS: 650.0000 mg | ORAL_TABLET | ORAL | Status: DC | PRN

## 2018-01-12 MED ORDER — PROPOFOL 10 MG/ML IV BOLUS
INTRAVENOUS | Status: AC
  Filled 2018-01-12: qty 40

## 2018-01-12 MED ORDER — ACETAMINOPHEN 500 MG PO TABS
1000.0000 mg | ORAL_TABLET | ORAL | Status: DC
Start: 2018-01-12 — End: 2018-01-12

## 2018-01-12 MED ORDER — KETOROLAC TROMETHAMINE 15 MG/ML IJ SOLN: 15.0000 mg | Freq: Once | INTRAMUSCULAR | Status: DC

## 2018-01-12 MED ORDER — EPHEDRINE 5 MG/ML INJ
INTRAVENOUS | Status: AC
  Filled 2018-01-12: qty 10

## 2018-01-12 MED ORDER — PHENYLEPHRINE 40 MCG/ML (10ML) SYRINGE FOR IV PUSH (FOR BLOOD PRESSURE SUPPORT)
PREFILLED_SYRINGE | INTRAVENOUS | Status: DC | PRN
  Administered 2018-01-12 (×2): 80 ug via INTRAVENOUS
  Administered 2018-01-12: 120 ug via INTRAVENOUS
  Administered 2018-01-12 (×2): 80 ug via INTRAVENOUS
  Administered 2018-01-12: 40 ug via INTRAVENOUS
  Administered 2018-01-12 (×2): 80 ug via INTRAVENOUS
  Administered 2018-01-12: 40 ug via INTRAVENOUS

## 2018-01-12 MED ORDER — MEPERIDINE HCL 25 MG/ML IJ SOLN: 6.2500 mg | INTRAMUSCULAR | Status: DC | PRN

## 2018-01-12 MED ORDER — FENTANYL CITRATE (PF) 100 MCG/2ML IJ SOLN
INTRAMUSCULAR | Status: AC
  Filled 2018-01-12: qty 2

## 2018-01-12 MED ORDER — DEXAMETHASONE SODIUM PHOSPHATE 4 MG/ML IJ SOLN
INTRAMUSCULAR | Status: DC | PRN
  Administered 2018-01-12: 4 mg via INTRAVENOUS

## 2018-01-12 MED ORDER — CEFAZOLIN SODIUM-DEXTROSE 2-4 GM/100ML-% IV SOLN: 2.0000 g | INTRAVENOUS | Status: DC

## 2018-01-12 MED ORDER — GLYCOPYRROLATE 0.2 MG/ML IV SOSY
PREFILLED_SYRINGE | INTRAVENOUS | Status: DC | PRN
  Administered 2018-01-12: .2 mg via INTRAVENOUS

## 2018-01-12 MED ORDER — GABAPENTIN 300 MG PO CAPS
300.0000 mg | ORAL_CAPSULE | ORAL | Status: AC
  Administered 2018-01-12: 300 mg via ORAL
  Filled 2018-01-12: qty 1

## 2018-01-12 MED ORDER — ONDANSETRON HCL 4 MG/2ML IJ SOLN
INTRAMUSCULAR | Status: AC
  Filled 2018-01-12: qty 2

## 2018-01-12 MED ORDER — ACETAMINOPHEN 325 MG PO TABS: 325.0000 mg | ORAL_TABLET | ORAL | Status: DC | PRN

## 2018-01-12 MED ORDER — FENTANYL CITRATE (PF) 100 MCG/2ML IJ SOLN
INTRAMUSCULAR | Status: DC | PRN
  Administered 2018-01-12: 125 ug via INTRAVENOUS
  Administered 2018-01-12: 25 ug via INTRAVENOUS

## 2018-01-12 MED ORDER — BUPIVACAINE-EPINEPHRINE (PF) 0.5% -1:200000 IJ SOLN
INTRAMUSCULAR | Status: DC | PRN
  Administered 2018-01-12: 16 mL

## 2018-01-12 MED ORDER — ACETAMINOPHEN 500 MG PO TABS
1000.0000 mg | ORAL_TABLET | ORAL | Status: AC
  Administered 2018-01-12: 1000 mg via ORAL
  Filled 2018-01-12: qty 2

## 2018-01-12 MED ORDER — LIDOCAINE 2% (20 MG/ML) 5 ML SYRINGE
INTRAMUSCULAR | Status: DC | PRN
  Administered 2018-01-12: 100 mg via INTRAVENOUS

## 2018-01-12 MED ORDER — 0.9 % SODIUM CHLORIDE (POUR BTL) OPTIME
TOPICAL | Status: DC | PRN
  Administered 2018-01-12: 1000 mL

## 2018-01-12 MED ORDER — SODIUM CHLORIDE 0.9 % IV SOLN: 250.0000 mL | INTRAVENOUS | Status: DC | PRN

## 2018-01-12 MED ORDER — PROPOFOL 10 MG/ML IV BOLUS
INTRAVENOUS | Status: DC | PRN
  Administered 2018-01-12: 110 mg via INTRAVENOUS

## 2018-01-12 MED ORDER — FENTANYL CITRATE (PF) 250 MCG/5ML IJ SOLN
INTRAMUSCULAR | Status: AC
Start: 2018-01-12 — End: 2018-01-12
  Filled 2018-01-12: qty 5

## 2018-01-12 SURGICAL SUPPLY — 45 items
ADH SKN CLS APL DERMABOND .7 (GAUZE/BANDAGES/DRESSINGS) ×1
APPLIER CLIP 9.375 MED OPEN (MISCELLANEOUS) ×2
BINDER BREAST LRG (GAUZE/BANDAGES/DRESSINGS) ×2 IMPLANT
BLADE SURG 15 STRL LF DISP TIS (BLADE) ×1 IMPLANT
BLADE SURG 15 STRL SS (BLADE) ×1
CANISTER SUCT 3000ML PPV (MISCELLANEOUS) ×2 IMPLANT
CHLORAPREP W/TINT 26ML (MISCELLANEOUS) ×2 IMPLANT
CLIP APPLIE 9.375 MED OPEN (MISCELLANEOUS) ×1 IMPLANT
COVER PROBE W GEL 5X96 (DRAPES) ×2 IMPLANT
COVER SURGICAL LIGHT HANDLE (MISCELLANEOUS) ×2 IMPLANT
DERMABOND ADVANCED (GAUZE/BANDAGES/DRESSINGS) ×1
DERMABOND ADVANCED .7 DNX12 (GAUZE/BANDAGES/DRESSINGS) ×1 IMPLANT
DEVICE DUBIN SPECIMEN MAMMOGRA (MISCELLANEOUS) ×2 IMPLANT
DRAPE CHEST BREAST 15X10 FENES (DRAPES) ×2 IMPLANT
DRAPE UTILITY XL STRL (DRAPES) ×2 IMPLANT
DRSG PAD ABDOMINAL 8X10 ST (GAUZE/BANDAGES/DRESSINGS) ×2 IMPLANT
ELECT CAUTERY BLADE 6.4 (BLADE) ×2 IMPLANT
ELECT REM PT RETURN 9FT ADLT (ELECTROSURGICAL) ×2
ELECTRODE REM PT RTRN 9FT ADLT (ELECTROSURGICAL) ×1 IMPLANT
GAUZE SPONGE 4X4 12PLY STRL (GAUZE/BANDAGES/DRESSINGS) ×2 IMPLANT
GAUZE SPONGE 4X4 12PLY STRL LF (GAUZE/BANDAGES/DRESSINGS) ×2 IMPLANT
GLOVE BIO SURGEON STRL SZ7.5 (GLOVE) ×2 IMPLANT
GLOVE BIOGEL PI IND STRL 8 (GLOVE) ×1 IMPLANT
GLOVE BIOGEL PI INDICATOR 8 (GLOVE) ×1
GLOVE ECLIPSE 8.0 STRL XLNG CF (GLOVE) ×2 IMPLANT
GLOVE EUDERMIC 7 POWDERFREE (GLOVE) ×4 IMPLANT
GOWN SPEC L3 XXLG W/TWL (GOWN DISPOSABLE) ×2 IMPLANT
GOWN STRL REUS W/ TWL XL LVL3 (GOWN DISPOSABLE) ×2 IMPLANT
GOWN STRL REUS W/TWL XL LVL3 (GOWN DISPOSABLE) ×2
KIT BASIN OR (CUSTOM PROCEDURE TRAY) ×2 IMPLANT
KIT MARKER MARGIN INK (KITS) ×2 IMPLANT
NEEDLE HYPO 25GX1X1/2 BEV (NEEDLE) ×2 IMPLANT
NS IRRIG 1000ML POUR BTL (IV SOLUTION) ×2 IMPLANT
PACK SURGICAL SETUP 50X90 (CUSTOM PROCEDURE TRAY) ×2 IMPLANT
PAD ABD 8X10 STRL (GAUZE/BANDAGES/DRESSINGS) ×2 IMPLANT
PENCIL BUTTON HOLSTER BLD 10FT (ELECTRODE) ×2 IMPLANT
SPONGE LAP 4X18 X RAY DECT (DISPOSABLE) ×2 IMPLANT
SUT MNCRL AB 4-0 PS2 18 (SUTURE) ×2 IMPLANT
SUT SILK 2 0 SH (SUTURE) ×2 IMPLANT
SUT VIC AB 3-0 SH 18 (SUTURE) ×2 IMPLANT
SYR BULB 3OZ (MISCELLANEOUS) ×2 IMPLANT
SYR CONTROL 10ML LL (SYRINGE) ×2 IMPLANT
TOWEL GREEN STERILE (TOWEL DISPOSABLE) ×2 IMPLANT
TUBE CONNECTING 12X1/4 (SUCTIONS) ×2 IMPLANT
YANKAUER SUCT BULB TIP NO VENT (SUCTIONS) ×2 IMPLANT

## 2018-01-12 NOTE — Anesthesia Postprocedure Evaluation (Signed)
Anesthesia Post Note  Patient: Marie Alvarez  Procedure(s) Performed: RIGHT BREAST LUMPECTOMY WITH RADIOACTIVE SEED LOCALIZATION (Right Breast)     Anesthesia Type: General Level of consciousness: awake Pain management: pain level controlled Vital Signs Assessment: post-procedure vital signs reviewed and stable Respiratory status: spontaneous breathing Cardiovascular status: stable Postop Assessment: no apparent nausea or vomiting Anesthetic complications: no    Last Vitals:  Vitals:   01/12/18 1117 01/12/18 1125  BP: 126/66   Pulse: 82 82  Resp: 16 18  Temp:  (!) 36.3 C  SpO2: 100% 100%    Last Pain:  Vitals:   01/12/18 0817  PainSc: 0-No pain   Pain Goal:                 Omair Dettmer JR,JOHN Nykolas Bacallao

## 2018-01-12 NOTE — Transfer of Care (Signed)
Immediate Anesthesia Transfer of Care Note  Patient: Marie Alvarez  Procedure(s) Performed: RIGHT BREAST LUMPECTOMY WITH RADIOACTIVE SEED LOCALIZATION (Right Breast)  Patient Location: PACU  Anesthesia Type:General  Level of Consciousness: sedated, drowsy and patient cooperative  Airway & Oxygen Therapy: Patient Spontanous Breathing and Patient connected to face mask oxygen  Post-op Assessment: Report given to RN and Post -op Vital signs reviewed and stable  Post vital signs: Reviewed and stable  Last Vitals:  Vitals:   01/12/18 0800  BP: (!) 167/56  Pulse: 86  Resp: 18  Temp: 36.4 C  SpO2: 100%    Last Pain:  Vitals:   01/12/18 0817  PainSc: 0-No pain         Complications: No apparent anesthesia complications

## 2018-01-12 NOTE — Anesthesia Preprocedure Evaluation (Signed)
Anesthesia Evaluation  Patient identified by MRN, date of birth, ID band Patient awake    Reviewed: Allergy & Precautions, NPO status , Patient's Chart, lab work & pertinent test results  Airway Mallampati: I       Dental no notable dental hx.    Pulmonary neg pulmonary ROS,    Pulmonary exam normal breath sounds clear to auscultation       Cardiovascular hypertension, Pt. on medications Normal cardiovascular exam Rhythm:Regular     Neuro/Psych negative neurological ROS  negative psych ROS   GI/Hepatic Neg liver ROS, GERD  Medicated and Controlled,  Endo/Other  negative endocrine ROS  Renal/GU negative Renal ROS  negative genitourinary   Musculoskeletal   Abdominal Normal abdominal exam  (+)   Peds  Hematology negative hematology ROS (+)   Anesthesia Other Findings   Reproductive/Obstetrics                             Anesthesia Physical Anesthesia Plan  ASA: II  Anesthesia Plan: General   Post-op Pain Management:    Induction: Intravenous  PONV Risk Score and Plan: 4 or greater and Ondansetron, Dexamethasone and Treatment may vary due to age or medical condition  Airway Management Planned: LMA  Additional Equipment:   Intra-op Plan:   Post-operative Plan:   Informed Consent: I have reviewed the patients History and Physical, chart, labs and discussed the procedure including the risks, benefits and alternatives for the proposed anesthesia with the patient or authorized representative who has indicated his/her understanding and acceptance.     Plan Discussed with: CRNA and Surgeon  Anesthesia Plan Comments:         Anesthesia Quick Evaluation

## 2018-01-12 NOTE — Anesthesia Procedure Notes (Signed)
Procedure Name: LMA Insertion Date/Time: 01/12/2018 9:27 AM Performed by: Adalberto Ill, CRNA Pre-anesthesia Checklist: Patient identified, Emergency Drugs available, Suction available, Patient being monitored and Timeout performed Patient Re-evaluated:Patient Re-evaluated prior to induction Oxygen Delivery Method: Circle system utilized Preoxygenation: Pre-oxygenation with 100% oxygen Induction Type: IV induction LMA: LMA inserted LMA Size: 4.0 Tube type: Oral Number of attempts: 1 Placement Confirmation: breath sounds checked- equal and bilateral and positive ETCO2 ETT to lip (cm): yes. Tube secured with: Tape Dental Injury: Teeth and Oropharynx as per pre-operative assessment

## 2018-01-12 NOTE — Op Note (Signed)
Patient Name:           Marie Alvarez   Date of Surgery:        01/12/2018  Pre op Diagnosis:      Invasive cancer central portion right breast  Post op Diagnosis:    Same  Procedure:                 Right breast lumpectomy with radioactive seed localization,  reexcision lateral margin  Surgeon:                     Edsel Petrin. Dalbert Batman, M.D., FACS  Assistant:                      OR staff  Operative Indications:   . This is a very pleasant 84 year old woman who is brought to the operating room for definitive surgery for her right breast cancer. Dr. Joylene Draft is her PCP. Dr. Phineas Real is her gynecologist. She has now seen Dr. Lindi Adie and Dr. Isidore Moos at the cancer center      In 2003 she underwent left breast lumpectomy and sentinel node biopsy by Dr. March Rummage. She had a port and was given chemotherapy. Formerly  followed by Dr. Humphrey Rolls now followed by nurse practitioner, health cancer center. No recurrence on the left. 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 A second biopsy of the right breast showed a benign fibroadenoma in the lower outer quadrant. The invasive cancer is strongly receptor positive and HER-2 negative.       She prefers breast conservation. I think she is a reasonable candidate for that.  Breast are atrophic and she will sustain some volume loss. Plan right breast lumpectomy with radioactive seed localization. Hopefully we can preserve the nipple and areola.  She agrees with this plan.    Operative Findings:       The breast cancer, marker clip and radioactive seed were deep within the right breast deep to the medial areolar margin.  The broad posterior margin is the pectoralis muscle.  The anterior margin of the specimen is the skin of the breast just medial to the areolar margin.  The specimen mammogram looked good with the marker clip and the seed  in the center of the specimen.  I reexcised the lateral margin as it was difficult to tell exactly where the tumor was.  Procedure in Detail:          Following the induction of general anesthesia the patient's right breast was prepped and draped in a sterile fashion.  Intravenous antibiotics were given.  Surgical timeout was performed.  0.5% Marcaine with epinephrine was used as a local infiltration anesthetic.  Using the neoprobe I identified the area of maximum radioactivity at the areolar margin at about the 3:00 position.  I made a fairly generous curvilinear incision medially at the areolar margin.  The lumpectomy was fairly generous due to the size of the tumor.  Lumpectomy was performed using the neoprobe and electrocautery.  Specimen was removed and marked with silk sutures and a 6 color ink kit to orient the pathologist.  The specimen mammogram looked good as described above.  I reexcised the lateral margin and inked it as well and sent it as a separate specimen.  Hemostasis was excellent and achieved with  electrocautery.  The wound was  irrigated.  I placed 5 metal marker clips in the lumpectomy cavity walls.  The breast was reapproximated with multiple interrupted sutures of 3-0 Vicryl and the skin closed with a running subcuticular 4-0 Monocryl and Dermabond.  Dry bandages and a breast binder were placed.  The patient tolerated the procedure well was taken to PACU in stable condition.  EBL 20 mL.  Counts correct.  Complications none.   Addendum: I logged onto the [employer] and reviewed her prescription medication history     [name] M. Dalbert Batman, M.D., FACS General and Minimally Invasive Surgery Breast and Colorectal Surgery  01/12/2018 10:32 AM

## 2018-01-12 NOTE — Interval H&P Note (Signed)
History and Physical Interval Note:  01/12/2018 8:29 AM  Marie Alvarez  has presented today for surgery, with the diagnosis of RIGHT BREAST CANCER  The various methods of treatment have been discussed with the patient and family. After consideration of risks, benefits and other options for treatment, the patient has consented to  Procedure(s): BREAST LUMPECTOMY WITH RADIOACTIVE SEED LOCALIZATION (Right) as a surgical intervention .  The patient's history has been reviewed, patient examined, no change in status, stable for surgery.  I have reviewed the patient's chart and labs.  Questions were answered to the patient's satisfaction.     Adin Hector

## 2018-01-12 NOTE — Discharge Instructions (Signed)
[facility] °Office Phone Number [phone] ° °BREAST BIOPSY/ PARTIAL MASTECTOMY: POST OP INSTRUCTIONS ° °Always review your discharge instruction sheet given to you by the facility where your surgery was performed. ° °IF YOU HAVE DISABILITY OR FAMILY LEAVE FORMS, YOU MUST BRING THEM TO THE OFFICE FOR PROCESSING.  DO NOT GIVE THEM TO YOUR DOCTOR. ° °1. A prescription for pain medication may be given to you upon discharge.  Take your pain medication as prescribed, if needed.  If narcotic pain medicine is not needed, then you may take acetaminophen (Tylenol) or ibuprofen (Advil) as needed. °2. Take your usually prescribed medications unless otherwise directed °3. If you need a refill on your pain medication, please contact your pharmacy.  They will contact our office to request authorization.  Prescriptions will not be filled after 5pm or on week-ends. °4. You should eat very light the first 24 hours after surgery, such as soup, crackers, pudding, etc.  Resume your normal diet the day after surgery. °5. Most patients will experience some swelling and bruising in the breast.  Ice packs and a good support bra will help.  Swelling and bruising can take several days to resolve.  °6. It is common to experience some constipation if taking pain medication after surgery.  Increasing fluid intake and taking a stool softener will usually help or prevent this problem from occurring.  A mild laxative (Milk of Magnesia or Miralax) should be taken according to package directions if there are no bowel movements after 48 hours. °7. Unless discharge instructions indicate otherwise, you may remove your bandages 24-48 hours after surgery, and you may shower at that time.  You may have steri-strips (small skin tapes) in place directly over the incision.  These strips should be left on the skin for 7-10 days.  If your surgeon used skin glue on the incision, you may shower in 24 hours.  The glue will flake off over the  next 2-3 weeks.  Any sutures or staples will be removed at the office during your follow-up visit. °8. ACTIVITIES:  You may resume regular daily activities (gradually increasing) beginning the next day.  Wearing a good support bra or sports bra minimizes pain and swelling.  You may have sexual intercourse when it is comfortable. °a. You may drive when you no longer are taking prescription pain medication, you can comfortably wear a seatbelt, and you can safely maneuver your car and apply brakes. °b. RETURN TO WORK:  ______________________________________________________________________________________ °9. You should see your doctor in the office for a follow-up appointment approximately two weeks after your surgery.  Your doctor’s nurse will typically make your follow-up appointment when she calls you with your pathology report.  Expect your pathology report 2-3 business days after your surgery.  You may call to check if you do not hear from us after three days. °10. OTHER INSTRUCTIONS: _______________________________________________________________________________________________ _____________________________________________________________________________________________________________________________________ °_____________________________________________________________________________________________________________________________________ °_____________________________________________________________________________________________________________________________________ ° °WHEN TO CALL YOUR DOCTOR: °1. Fever over 101.0 °2. Nausea and/or vomiting. °3. Extreme swelling or bruising. °4. Continued bleeding from incision. °5. Increased pain, redness, or drainage from the incision. ° °The clinic staff is available to answer your questions during regular business hours.  Please don’t hesitate to call and ask to speak to one of the nurses for clinical concerns.  If you have a medical emergency, go to the nearest  emergency room or call 911.  A surgeon from [facility] is always on call at the hospital. ° °For further questions, please visit [email or web address]  °

## 2018-01-13 ENCOUNTER — Encounter (HOSPITAL_COMMUNITY): Payer: Self-pay | Admitting: General Surgery

## 2018-01-15 DIAGNOSIS — N362 Urethral caruncle: Secondary | ICD-10-CM | POA: Diagnosis not present

## 2018-01-15 DIAGNOSIS — N952 Postmenopausal atrophic vaginitis: Secondary | ICD-10-CM | POA: Diagnosis not present

## 2018-01-15 DIAGNOSIS — N368 Other specified disorders of urethra: Secondary | ICD-10-CM | POA: Diagnosis not present

## 2018-01-17 NOTE — Progress Notes (Signed)
Inform patient of Pathology report,. Tell her that her small cancer was completely removed The margins are negative and so she will not need any further surgery That is good news Let me know that you've reached her   hmi

## 2018-01-29 DIAGNOSIS — E559 Vitamin D deficiency, unspecified: Secondary | ICD-10-CM | POA: Diagnosis not present

## 2018-01-29 DIAGNOSIS — R82998 Other abnormal findings in urine: Secondary | ICD-10-CM | POA: Diagnosis not present

## 2018-01-29 DIAGNOSIS — D519 Vitamin B12 deficiency anemia, unspecified: Secondary | ICD-10-CM | POA: Diagnosis not present

## 2018-01-29 DIAGNOSIS — I10 Essential (primary) hypertension: Secondary | ICD-10-CM | POA: Diagnosis not present

## 2018-01-29 DIAGNOSIS — D518 Other vitamin B12 deficiency anemias: Secondary | ICD-10-CM | POA: Diagnosis not present

## 2018-01-29 DIAGNOSIS — E7849 Other hyperlipidemia: Secondary | ICD-10-CM | POA: Diagnosis not present

## 2018-01-29 DIAGNOSIS — R7301 Impaired fasting glucose: Secondary | ICD-10-CM | POA: Diagnosis not present

## 2018-01-30 ENCOUNTER — Ambulatory Visit: Payer: Medicare HMO | Admitting: Hematology and Oncology

## 2018-01-30 ENCOUNTER — Inpatient Hospital Stay: Payer: Medicare HMO | Attending: Oncology | Admitting: Hematology and Oncology

## 2018-01-30 ENCOUNTER — Ambulatory Visit: Payer: Medicare HMO

## 2018-01-30 ENCOUNTER — Ambulatory Visit: Payer: Medicare HMO | Admitting: Radiation Oncology

## 2018-01-30 DIAGNOSIS — C50211 Malignant neoplasm of upper-inner quadrant of right female breast: Secondary | ICD-10-CM | POA: Insufficient documentation

## 2018-01-30 DIAGNOSIS — Z853 Personal history of malignant neoplasm of breast: Secondary | ICD-10-CM | POA: Insufficient documentation

## 2018-01-30 DIAGNOSIS — Z170 Estrogen receptor positive status [ER+]: Secondary | ICD-10-CM | POA: Diagnosis not present

## 2018-01-30 NOTE — Assessment & Plan Note (Signed)
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 We also discussed the final staging along with previously performed ER/PR and HER-2/neu testing.  Plan: 1. Adjuvant radiation therapy followed by 2. Adjuvant antiestrogen therapy  RTC after XRT to start anti estrogen therapy

## 2018-01-30 NOTE — Progress Notes (Signed)
Patient Care Team: Crist Infante, MD as PCP - General (Internal Medicine)  DIAGNOSIS:  Encounter Diagnosis  Name Primary?  . Malignant neoplasm of upper-inner quadrant of right breast in female, estrogen receptor positive ([place])     SUMMARY OF ONCOLOGIC HISTORY:   Malignant neoplasm of upper-outer quadrant of left breast in female, estrogen receptor positive ([place])   07/01/2002 Surgery    Left breast lumpectomy, stage 2 invasive ductal carcinoma with DCIS ER positive PR positive HER-2 negative, 3.4 cm tumor      07/01/2002 Pathologic Stage    Stage IIA: T2 N0      08/06/2002 - 11/06/2002 Chemotherapy    Adjuvant chemotherapy with CMF 6      12/06/2002 - 12/31/2002 Radiation Therapy    Adjuvant radiation therapy      01/29/2003 - 01/06/2009 Anti-estrogen oral therapy    Tamoxifen for 1 year then Arimidex 1 mg daily       Malignant neoplasm of upper-inner quadrant of right breast in female, estrogen receptor positive ([place])   2003 Miscellaneous    Left breast lumpectomy, stage 2 invasive ductal carcinoma with DCIS ER positive PR positive HER-2 negative, 3.4 cm tumor, CMF x6 followed by radiation followed by tamoxifen and later anastrozole completed 2010       11/29/2017 Initial Diagnosis    Right breast biopsy 4 o'clock position: Grade 1-2 IDC with DCIS with necrosis, ER 100%, PR 100%, Ki-67 2%, HER-2 negative ratio 1.19; 7 o'clock position fibroadenoma; ultrasound detected 3 o'clock position 2 x 1.5 x 1.7 cm, T1CN0 stage I a clinical stage      01/12/2018 Surgery    Rt Lumpectomy: IDC grade 1 measuring 0.9 cm with ext DCIS, Margins Neg, ER 100%, PR 100%, Ki-67 2%, HER-2 negative ratio 1.19, T1bNx Stage 1A         CHIEF COMPLIANT: Follow-up after recent right lumpectomy  INTERVAL HISTORY: Marie Alvarez is a 84 year old with above-mentioned history of right breast cancer who underwent lumpectomy and is here to discuss results.  She had a previous left breast cancer treated  with lumpectomy and radiation and hormonal therapy which was completed in 2010.  She is recovering very well from recent surgery with some mild discomfort.  REVIEW OF SYSTEMS:   Constitutional: Denies fevers, chills or abnormal weight loss Eyes: Denies blurriness of vision Ears, nose, mouth, throat, and face: Denies mucositis or sore throat Respiratory: Denies cough, dyspnea or wheezes Cardiovascular: Denies palpitation, chest discomfort Gastrointestinal:  Denies nausea, heartburn or change in bowel habits Skin: Denies abnormal skin rashes Lymphatics: Denies new lymphadenopathy or easy bruising Neurological:Denies numbness, tingling or new weaknesses Behavioral/Psych: Mood is stable, no new changes  Extremities: No lower extremity edema Breast: Right lumpectomy recently All other systems were reviewed with the patient and are negative.  I have reviewed the past medical history, past surgical history, social history and family history with the patient and they are unchanged from previous note.  ALLERGIES:  is allergic to prednisone and sulfamethoxazole.  MEDICATIONS:  Current Outpatient Medications  Medication Sig Dispense Refill  . acetaminophen (TYLENOL) 650 MG CR tablet Take 650 mg by mouth 2 (two) times daily as needed for pain.    Marland Kitchen amLODipine (NORVASC) 5 MG tablet Take 5 mg by mouth daily.     Marland Kitchen aspirin 81 MG tablet Take 81 mg by mouth every Monday, Wednesday, and Friday.     . bismuth subsalicylate (PEPTO BISMOL) 262 MG/15ML suspension Take 30 mLs by mouth daily  as needed for indigestion or diarrhea or loose stools.    . Calcium Acetate POWD Take 3.3 g by mouth 2 (two) times daily.    . Carboxymethylcellul-Glycerin (LUBRICATING EYE DROPS OP) Place 1 drop into both eyes daily as needed (dry eyes).    . cholecalciferol (VITAMIN D) 1000 units tablet Take 1,000 Units by mouth daily.    . DENTA 5000 PLUS 1.1 % CREA dental cream Apply to teeth once daily at bedtime  3  .  HYDROcodone-acetaminophen (NORCO) 5-325 MG tablet Take 1-2 tablets by mouth every 6 (six) hours as needed for moderate pain or severe pain. 15 tablet 0  . irbesartan (AVAPRO) 150 MG tablet Take 150 mg by mouth daily.    Marland Kitchen loratadine (CLARITIN) 10 MG tablet Take 10 mg by mouth daily.    . Multiple Vitamins-Minerals (PRESERVISION AREDS 2 PO) Take 1 capsule by mouth 2 (two) times daily.    . nortriptyline (PAMELOR) 10 MG capsule Take 10 mg by mouth at bedtime.    . pantoprazole (PROTONIX) 40 MG tablet Take 40 mg by mouth daily. May take a second 40 mg dose as needed for acid reflux    . pravastatin (PRAVACHOL) 40 MG tablet Take 40 mg by mouth daily.    . vitamin B-12 (CYANOCOBALAMIN) 1000 MCG tablet Take 1,000 mcg by mouth daily.     No current facility-administered medications for this visit.     PHYSICAL EXAMINATION: ECOG PERFORMANCE STATUS: 1 - Symptomatic but completely ambulatory  Vitals:   01/30/18 1032  BP: (!) 159/63  Pulse: 85  Resp: 16  Temp: 97.8 F (36.6 C)  SpO2: 100%   Filed Weights   01/30/18 1032  Weight: 142 lb 9.6 oz (64.7 kg)    GENERAL:alert, no distress and comfortable SKIN: skin color, texture, turgor are normal, no rashes or significant lesions EYES: normal, Conjunctiva are pink and non-injected, sclera clear OROPHARYNX:no exudate, no erythema and lips, buccal mucosa, and tongue normal  NECK: supple, thyroid normal size, non-tender, without nodularity LYMPH:  no palpable lymphadenopathy in the cervical, axillary or inguinal LUNGS: clear to auscultation and percussion with normal breathing effort HEART: regular rate & rhythm and no murmurs and no lower extremity edema ABDOMEN:abdomen soft, non-tender and normal bowel sounds MUSCULOSKELETAL:no cyanosis of digits and no clubbing  NEURO: alert & oriented x 3 with fluent speech, no focal motor/sensory deficits EXTREMITIES: No lower extremity edema  LABORATORY DATA:  I have reviewed the data as listed CMP  Latest Ref Rng & Units 01/08/2018 02/15/2017 02/06/2015  Glucose 65 - 99 mg/dL 102(H) 93 87  BUN 6 - 20 mg/dL 16 12.5 14.6  Creatinine 0.44 - 1.00 mg/dL 0.76 0.8 0.8  Sodium 135 - 145 mmol/L 137 140 142  Potassium 3.5 - 5.1 mmol/L 4.5 4.1 4.3  Chloride 101 - 111 mmol/L 103 - -  CO2 22 - 32 mmol/L 25 29 28   Calcium 8.9 - 10.3 mg/dL 9.7 10.5(H) 10.4  Total Protein 6.4 - 8.3 g/dL - 7.2 7.1  Total Bilirubin 0.20 - 1.20 mg/dL - 0.70 0.61  Alkaline Phos 40 - 150 U/L - 75 71  AST 5 - 34 U/L - 16 14  ALT 0 - 55 U/L - 11 8    Lab Results  Component Value Date   WBC 6.0 01/08/2018   HGB 15.0 01/08/2018   HCT 46.3 (H) 01/08/2018   MCV 91.9 01/08/2018   PLT 179 01/08/2018   NEUTROABS 4.0 02/15/2017    ASSESSMENT &  PLAN:  Malignant neoplasm of upper-inner quadrant of right breast in female, estrogen receptor positive ([place]) 01/12/18: Rt Lumpectomy: IDC grade 1 measuring 0.9 cm with ext DCIS, Margins Neg, ER 100%, PR 100%, Ki-67 2%, HER-2 negative ratio 1.19, T1bNx Stage 1A  Pathology counseling: I discussed the final pathology report of the patient provided  a copy of this report. We also discussed the final staging along with previously performed ER/PR and HER-2/neu testing.  Plan: 1. Adjuvant radiation therapy followed by 2. Adjuvant antiestrogen therapy  I discussed with her about the pros and cons of radiation therapy as well as antiestrogen therapies. We discussed the benefits of some of these treatments may be less as patient's cross over 70 or 75. Patient wanted to receive maximal therapy. She will be meeting with Dr. Isidore Moos to discuss the pros and cons of radiation on March 6.  RTC after XRT to start anti estrogen therapy  I spent 25 minutes talking to the patient of which more than half was spent in counseling and coordination of care.  No orders of the defined types were placed in this encounter.  The patient has a good understanding of the overall plan. she agrees with it. she  will call with any problems that may develop before the next visit here.   Harriette Ohara, MD 01/30/18

## 2018-02-05 DIAGNOSIS — R1084 Generalized abdominal pain: Secondary | ICD-10-CM | POA: Diagnosis not present

## 2018-02-05 DIAGNOSIS — D518 Other vitamin B12 deficiency anemias: Secondary | ICD-10-CM | POA: Diagnosis not present

## 2018-02-05 DIAGNOSIS — R7301 Impaired fasting glucose: Secondary | ICD-10-CM | POA: Diagnosis not present

## 2018-02-05 DIAGNOSIS — M542 Cervicalgia: Secondary | ICD-10-CM | POA: Diagnosis not present

## 2018-02-05 DIAGNOSIS — R82998 Other abnormal findings in urine: Secondary | ICD-10-CM | POA: Diagnosis not present

## 2018-02-05 DIAGNOSIS — N390 Urinary tract infection, site not specified: Secondary | ICD-10-CM | POA: Diagnosis not present

## 2018-02-05 DIAGNOSIS — R69 Illness, unspecified: Secondary | ICD-10-CM | POA: Diagnosis not present

## 2018-02-05 DIAGNOSIS — D692 Other nonthrombocytopenic purpura: Secondary | ICD-10-CM | POA: Diagnosis not present

## 2018-02-05 DIAGNOSIS — R630 Anorexia: Secondary | ICD-10-CM | POA: Diagnosis not present

## 2018-02-05 DIAGNOSIS — Z0000 Encounter for general adult medical examination without abnormal findings: Secondary | ICD-10-CM | POA: Diagnosis not present

## 2018-02-05 DIAGNOSIS — C50919 Malignant neoplasm of unspecified site of unspecified female breast: Secondary | ICD-10-CM | POA: Diagnosis not present

## 2018-02-05 DIAGNOSIS — R5383 Other fatigue: Secondary | ICD-10-CM | POA: Diagnosis not present

## 2018-02-08 DIAGNOSIS — Z1212 Encounter for screening for malignant neoplasm of rectum: Secondary | ICD-10-CM | POA: Diagnosis not present

## 2018-02-08 NOTE — Progress Notes (Signed)
Error, see consult encounter

## 2018-02-14 ENCOUNTER — Encounter: Payer: Medicare HMO | Admitting: Adult Health

## 2018-02-14 ENCOUNTER — Ambulatory Visit: Payer: Medicare HMO

## 2018-02-14 ENCOUNTER — Ambulatory Visit: Payer: Medicare HMO | Admitting: Radiation Oncology

## 2018-02-14 ENCOUNTER — Encounter: Payer: Self-pay | Admitting: Radiation Oncology

## 2018-02-14 ENCOUNTER — Ambulatory Visit
Admission: RE | Admit: 2018-02-14 | Discharge: 2018-02-14 | Disposition: A | Discharge status: Home / Self Care | Payer: Medicare HMO | Source: Ambulatory Visit | Attending: Radiation Oncology | Admitting: Radiation Oncology

## 2018-02-14 DIAGNOSIS — C50211 Malignant neoplasm of upper-inner quadrant of right female breast: Secondary | ICD-10-CM

## 2018-02-14 DIAGNOSIS — Z170 Estrogen receptor positive status [ER+]: Secondary | ICD-10-CM

## 2018-02-14 NOTE — Progress Notes (Signed)
Location of Breast Cancer: Right Breast  Histology per Pathology Report:  11/29/17 Diagnosis 1. Breast, right, needle core biopsy, 4:00 o'clock - INVASIVE DUCTAL CARCINOMA. - DUCTAL CARCINOMA IN SITU WITH FOCAL NECROSIS. 2. Breast, right, needle core biopsy, 7:00 o'clock - FIBROADENOMA. - NO EVIDENCE OF MALIGNANCY  Receptor Status: ER(100%), PR (100%), Her2-neu (NEG), Ki-(2%)  01/12/18 Diagnosis 1. Breast, lumpectomy, Right - INVASIVE DUCTAL CARCINOMA WITH MUCINOUS FEATURES, NOTTINGHAM GRADE 1 OF 3, 0.9 CM - EXTENSIVE DUCTAL CARCINOMA IN SITU, SOLID AND CRIBRIFORM PATTERNS - MARGINS UNINVOLVED BY CARCINOMA (0.3 CM ANTERIOR MARGIN; SEE ONCOLOGY TABLE) - PREVIOUS BIOPSY SITE CHANGES - SEE ONCOLOGY TABLE BELOW 2. Breast, excision, Right lateral margin - FIBROADENOMA - LOBULAR NEOPLASIA (ATYPICAL LOBULAR HYPERPLASIA) - PREVIOUS BIOPSY SITE CHANGES  Did patient present with symptoms or was this found on screening mammography?: It was found on a screening mammogram.   Past/Anticipated interventions by surgeon, if any: 01/12/18 Procedure:                 Right breast lumpectomy with radioactive seed localization,  reexcision lateral margin Surgeon:                     Edsel Petrin. Dalbert Batman, M.D., [facility]    Past/Anticipated interventions by medical oncology, if any: 01/30/18 Dr. Lindi Adie Plan: 1. Adjuvant radiation therapy followed by 2. Adjuvant antiestrogen therapy  I discussed with her about the pros and cons of radiation therapy as well as antiestrogen therapies. We discussed the benefits of some of these treatments may be less as patient's cross over 70 or 75. Patient wanted to receive maximal therapy. She will be meeting with Dr. Isidore Moos to discuss the pros and cons of radiation on March 6.  RTC after XRT to start anti estrogen therapy   Lymphedema issues, if any:  She denies, She does have some swelling to her incision site.   Pain issues, if any:  She denies current  pain. She does report occasional sharp pains to her Right Breast.   SAFETY ISSUES:  Prior radiation? Yes, Left Breast 11/2002- 12/2002  Pacemaker/ICD? No  Possible current pregnancy? No  Is the patient on methotrexate? No  Current Complaints / other details:  BP (!) 160/66   Pulse 83   Temp 97.7 F (36.5 C)   Ht 5' 2.5" (1.588 m)   Wt 140 lb 12.8 oz (63.9 kg)   SpO2 99% Comment: room air  BMI 25.34 kg/m    Wt Readings from Last 3 Encounters:  02/21/18 140 lb 12.8 oz (63.9 kg)  01/30/18 142 lb 9.6 oz (64.7 kg)  01/12/18 139 lb (63 kg)

## 2018-02-21 ENCOUNTER — Encounter: Payer: Self-pay | Admitting: Radiation Oncology

## 2018-02-21 ENCOUNTER — Other Ambulatory Visit: Payer: Self-pay

## 2018-02-21 ENCOUNTER — Ambulatory Visit
Admission: RE | Admit: 2018-02-21 | Discharge: 2018-02-21 | Disposition: A | Discharge status: Home / Self Care | Payer: Medicare HMO | Source: Ambulatory Visit | Attending: Radiation Oncology | Admitting: Radiation Oncology

## 2018-02-21 VITALS — BP 160/66 | HR 83 | Temp 97.7°F | Ht 62.5 in | Wt 140.8 lb

## 2018-02-21 DIAGNOSIS — Z170 Estrogen receptor positive status [ER+]: Secondary | ICD-10-CM

## 2018-02-21 DIAGNOSIS — Z510 Encounter for antineoplastic radiation therapy: Secondary | ICD-10-CM | POA: Insufficient documentation

## 2018-02-21 DIAGNOSIS — C50211 Malignant neoplasm of upper-inner quadrant of right female breast: Secondary | ICD-10-CM

## 2018-02-21 DIAGNOSIS — Z9889 Other specified postprocedural states: Secondary | ICD-10-CM | POA: Diagnosis not present

## 2018-02-21 NOTE — Progress Notes (Signed)
  Radiation Oncology         ([phone]) [phone] ________________________________  Name: Marie Alvarez MRN: [id number]  Date: 02/21/2018  DOB: 1931-04-24  SIMULATION AND TREATMENT PLANNING NOTE  Special Treatment Procedure Note  Outpatient  DIAGNOSIS:     ICD-10-CM   1. Malignant neoplasm of upper-inner quadrant of right breast in female, estrogen receptor positive ([place]) C50.211    Z17.0     NARRATIVE:  The patient was brought to the [facility].  Identity was confirmed.  All relevant records and images related to the planned course of therapy were reviewed.  The patient freely provided informed written consent to proceed with treatment after reviewing the details related to the planned course of therapy. The consent form was witnessed and verified by the simulation staff.    Then, the patient was set-up in a stable reproducible supine position for radiation therapy with her ipsilateral arm over her head, and her upper body secured in a custom-made Vac-lok device.  CT images were obtained.  Surface markings were placed.  The CT images were loaded into the planning software.    TREATMENT PLANNING NOTE: Treatment planning then occurred.  The radiation prescription was entered and confirmed.     A total of 3 medically necessary complex treatment devices were fabricated and supervised by me: 2 fields with MLCs for custom blocks to protect heart, and lungs;  and, a Vac-lok. MORE COMPLEX DEVICES MAY BE MADE IN DOSIMETRY FOR FIELD IN FIELD BEAMS FOR DOSE HOMOGENEITY.  I have requested : 3D Simulation which is medically necessary to give adequate dose to at risk tissues while sparing lungs and heart.  I have requested a DVH of the following structures: lungs, heart, lumpectomy cavity.    The patient will receive 40.05 Gy in 15 fractions to the right breat and axilla with 2 high tangential fields.   This will be followed by a boost.  Optical Surface Tracking Plan:  Since intensity modulated  radiotherapy (IMRT) and 3D conformal radiation treatment methods are predicated on accurate and precise positioning for treatment, intrafraction motion monitoring is medically necessary to ensure accurate and safe treatment delivery. The ability to quantify intrafraction motion without excessive ionizing radiation dose can only be performed with optical surface tracking. Accordingly, surface imaging offers the opportunity to obtain 3D measurements of patient position throughout IMRT and 3D treatments without excessive radiation exposure. I am ordering optical surface tracking for this patient's upcoming course of radiotherapy.  ________________________________   Reference:  Ursula Alert, J, et al. Surface imaging-based analysis of intrafraction motion for breast radiotherapy patients.Journal of [place], n. 6, nov. 2014. ISSN [id number].  Available at: <http://www.jacmp.org/index.php/jacmp/article/view/4957>.   Special Treatment Procedure Note: The patient received prior radiotherapy close to her current fields. There could be some overlap of radiation dose.  Prior regional radiotherapy increases the risk of side effects from treatment. I have considered this in the treatment planning process and have aimed to minimize tissue overlap.  This increases the complexity of this patient's treatment and therefore this constitutes a special treatment procedure.  -----------------------------------  Eppie Gibson, MD

## 2018-02-21 NOTE — Progress Notes (Signed)
Radiation Oncology         [phone] ________________________________  Name: Marie Alvarez MRN: [id number]  Date: 02/21/2018  DOB: 12/04/1931  Follow-Up Visit Note  Outpatient  CC: Perini, Mark, MD  Gudena, Vinay, MD  Diagnosis:      ICD-10-CM   1. Malignant neoplasm of upper-inner quadrant of right breast in female, estrogen receptor positive ([facility]) C50.211    Z17.0     Stage IA T1cN0M0 Right Breast UIQ Invasive Ductal Carcinoma, ER 100% / PR 100% / Her2 negative, Grade I-II  CHIEF COMPLAINT: Here to discuss management of right breast cancer  Narrative:  The patient returns today for follow-up since multidisciplinary clinic on 12/26/17.     Since consultation, she underwent right breast lumpectomy on 01/12/18. This revealed invasive ductal carcinoma with mucinous features, grade 1, 0.9 cm in greatest dimension. There was extensive DCIS. Margins were uninvolved, but 0.3 cm from anterior margin. Excision of the right lateral margin showed fibroadenoma and lobular neoplasia. The patient met with Dr. Gudena on 01/30/18. Per his note, he discussed the pros and cons of antiestrogen therapies and she will proceed with adjuvant antiestrogen therapy following radiation therapy. She returns today to review the role of radiation oncology as part of her disease management.   The patient currently reports some swelling to the incision site. She also notes occasional sharp pains to the right breast. She denies lymphedema issues or current pain. She currently has a cold.            ALLERGIES:  is allergic to prednisone and sulfamethoxazole.  Meds: Current Outpatient Medications  Medication Sig Dispense Refill  . acetaminophen (TYLENOL) 650 MG CR tablet Take 650 mg by mouth 2 (two) times daily as needed for pain.    . amLODipine (NORVASC) 5 MG tablet Take 5 mg by mouth daily.     . Calcium Acetate POWD Take 3.3 g by mouth 2 (two) times daily.    . Carboxymethylcellul-Glycerin (LUBRICATING EYE  DROPS OP) Place 1 drop into both eyes daily as needed (dry eyes).    . cholecalciferol (VITAMIN D) 1000 units tablet Take 1,000 Units by mouth daily.    . loratadine (CLARITIN) 10 MG tablet Take 10 mg by mouth daily.    . losartan (COZAAR) 100 MG tablet Take 100 mg by mouth daily.    . Multiple Vitamins-Minerals (PRESERVISION AREDS 2 PO) Take 1 capsule by mouth 2 (two) times daily.    . nortriptyline (PAMELOR) 10 MG capsule Take 10 mg by mouth at bedtime.    . pantoprazole (PROTONIX) 40 MG tablet Take 40 mg by mouth daily. May take a second 40 mg dose as needed for acid reflux    . pravastatin (PRAVACHOL) 40 MG tablet Take 40 mg by mouth daily.    . vitamin B-12 (CYANOCOBALAMIN) 1000 MCG tablet Take 1,000 mcg by mouth daily.     No current facility-administered medications for this encounter.     Physical Findings:  height is 5' 2.5" (1.588 m) and weight is 140 lb 12.8 oz (63.9 kg). Her temperature is 97.7 F (36.5 C). Her blood pressure is 160/66 (abnormal) and her pulse is 83. Her oxygen saturation is 99%. .     General: Alert and oriented, in no acute distress Neurologic: No obvious focalities. Speech is fluent.  Psychiatric: Judgment and insight are intact. Affect is appropriate. Breast exam reveals medial to her nipple, a puckered but well healed lumpectomy scar on the right. There is some   superficial desquamation around her nipple where the skin has not been aerated very well due to puckering. No sign of infection.  Lab Findings: Lab Results  Component Value Date   WBC 6.0 01/08/2018   HGB 15.0 01/08/2018   HCT 46.3 (H) 01/08/2018   MCV 91.9 01/08/2018   PLT 179 01/08/2018    @LASTCHEMISTRY@  Radiographic Findings: No results found.  Impression/Plan: Right breast cancer We discussed adjuvant radiotherapy today. She would like to be aggressive with her locoregional control.  I recommend radiation therapy to the right breast in order to reduce the risk of locoregional  recurrence by 2/3.  The risks, benefits and side effects of this treatment were discussed in detail.  She understands that radiotherapy is associated with skin irritation and fatigue in the acute setting. Late effects can include cosmetic changes and rare injury to internal organs.   She is enthusiastic about proceeding with treatment.  Advised patient to clean the right breast and apply neosporin to the incision site. Samples of neosporin were provided.  A total of 3 medically necessary complex treatment devices will be fabricated and supervised by me: 2 fields with MLCs for custom blocks to protect heart, and lungs;  and, a Vac-lok. MORE COMPLEX DEVICES MAY BE MADE IN DOSIMETRY FOR FIELD IN FIELD BEAMS FOR DOSE HOMOGENEITY.  I have requested : 3D Simulation which is medically necessary to give adequate dose to at risk tissues while sparing lungs and heart.  I have requested a DVH of the following structures: lungs, heart, right lumpectomy cavity.    The patient will receive 40.05 Gy in 15 fractions to the right breast and axilla with high tangents with 2 fields.  This will be followed by a boost.  I spent 20 minutes minutes face to face with the patient and more than 50% of that time was spent in counseling and/or coordination of care. _____________________________________   Sarah Squire, MD  This document serves as a record of services personally performed by Sarah Squire, MD. It was created on her behalf by Leslie Hoyt, a trained medical scribe. The creation of this record is based on the scribe's personal observations and the provider's statements to them. This document has been checked and approved by the attending provider.  

## 2018-02-23 ENCOUNTER — Encounter: Payer: Self-pay | Admitting: Radiation Oncology

## 2018-02-27 DIAGNOSIS — Z510 Encounter for antineoplastic radiation therapy: Secondary | ICD-10-CM | POA: Diagnosis not present

## 2018-02-27 DIAGNOSIS — C50211 Malignant neoplasm of upper-inner quadrant of right female breast: Secondary | ICD-10-CM | POA: Diagnosis not present

## 2018-02-27 DIAGNOSIS — Z170 Estrogen receptor positive status [ER+]: Secondary | ICD-10-CM | POA: Diagnosis not present

## 2018-02-28 ENCOUNTER — Ambulatory Visit
Admission: RE | Admit: 2018-02-28 | Discharge: 2018-02-28 | Disposition: A | Discharge status: Home / Self Care | Payer: Medicare HMO | Source: Ambulatory Visit | Attending: Radiation Oncology | Admitting: Radiation Oncology

## 2018-02-28 DIAGNOSIS — Z510 Encounter for antineoplastic radiation therapy: Secondary | ICD-10-CM | POA: Diagnosis not present

## 2018-02-28 DIAGNOSIS — Z170 Estrogen receptor positive status [ER+]: Secondary | ICD-10-CM | POA: Diagnosis not present

## 2018-02-28 DIAGNOSIS — C50211 Malignant neoplasm of upper-inner quadrant of right female breast: Secondary | ICD-10-CM | POA: Diagnosis not present

## 2018-03-01 ENCOUNTER — Ambulatory Visit
Admission: RE | Admit: 2018-03-01 | Discharge: 2018-03-01 | Disposition: A | Discharge status: Home / Self Care | Payer: Medicare HMO | Source: Ambulatory Visit | Attending: Radiation Oncology | Admitting: Radiation Oncology

## 2018-03-01 DIAGNOSIS — Z170 Estrogen receptor positive status [ER+]: Secondary | ICD-10-CM | POA: Diagnosis not present

## 2018-03-01 DIAGNOSIS — C50211 Malignant neoplasm of upper-inner quadrant of right female breast: Secondary | ICD-10-CM | POA: Diagnosis not present

## 2018-03-01 DIAGNOSIS — Z510 Encounter for antineoplastic radiation therapy: Secondary | ICD-10-CM | POA: Diagnosis not present

## 2018-03-01 DIAGNOSIS — C50412 Malignant neoplasm of upper-outer quadrant of left female breast: Secondary | ICD-10-CM

## 2018-03-01 MED ORDER — ALRA NON-METALLIC DEODORANT (RAD-ONC)
1.0000 "application " | Freq: Once | TOPICAL | Status: AC
  Administered 2018-03-01: 1 via TOPICAL

## 2018-03-01 MED ORDER — RADIAPLEXRX EX GEL
Freq: Once | CUTANEOUS | Status: AC
  Administered 2018-03-01: 15:00:00 via TOPICAL

## 2018-03-01 NOTE — Progress Notes (Signed)

## 2018-03-02 ENCOUNTER — Ambulatory Visit
Admission: RE | Admit: 2018-03-02 | Discharge: 2018-03-02 | Disposition: A | Discharge status: Home / Self Care | Payer: Medicare HMO | Source: Ambulatory Visit | Attending: Radiation Oncology | Admitting: Radiation Oncology

## 2018-03-02 DIAGNOSIS — C50211 Malignant neoplasm of upper-inner quadrant of right female breast: Secondary | ICD-10-CM | POA: Diagnosis not present

## 2018-03-02 DIAGNOSIS — Z510 Encounter for antineoplastic radiation therapy: Secondary | ICD-10-CM | POA: Diagnosis not present

## 2018-03-02 DIAGNOSIS — Z170 Estrogen receptor positive status [ER+]: Secondary | ICD-10-CM | POA: Diagnosis not present

## 2018-03-05 ENCOUNTER — Ambulatory Visit
Admission: RE | Admit: 2018-03-05 | Discharge: 2018-03-05 | Disposition: A | Discharge status: Home / Self Care | Payer: Medicare HMO | Source: Ambulatory Visit | Attending: Radiation Oncology | Admitting: Radiation Oncology

## 2018-03-05 DIAGNOSIS — C50211 Malignant neoplasm of upper-inner quadrant of right female breast: Secondary | ICD-10-CM | POA: Diagnosis not present

## 2018-03-05 DIAGNOSIS — Z510 Encounter for antineoplastic radiation therapy: Secondary | ICD-10-CM | POA: Diagnosis not present

## 2018-03-05 DIAGNOSIS — Z170 Estrogen receptor positive status [ER+]: Secondary | ICD-10-CM | POA: Diagnosis not present

## 2018-03-06 ENCOUNTER — Ambulatory Visit
Admission: RE | Admit: 2018-03-06 | Discharge: 2018-03-06 | Disposition: A | Discharge status: Home / Self Care | Payer: Medicare HMO | Source: Ambulatory Visit | Attending: Radiation Oncology | Admitting: Radiation Oncology

## 2018-03-06 DIAGNOSIS — Z510 Encounter for antineoplastic radiation therapy: Secondary | ICD-10-CM | POA: Diagnosis not present

## 2018-03-06 DIAGNOSIS — Z170 Estrogen receptor positive status [ER+]: Secondary | ICD-10-CM | POA: Diagnosis not present

## 2018-03-06 DIAGNOSIS — C50211 Malignant neoplasm of upper-inner quadrant of right female breast: Secondary | ICD-10-CM | POA: Diagnosis not present

## 2018-03-07 ENCOUNTER — Ambulatory Visit
Admission: RE | Admit: 2018-03-07 | Discharge: 2018-03-07 | Disposition: A | Discharge status: Home / Self Care | Payer: Medicare HMO | Source: Ambulatory Visit | Attending: Radiation Oncology | Admitting: Radiation Oncology

## 2018-03-07 DIAGNOSIS — Z170 Estrogen receptor positive status [ER+]: Secondary | ICD-10-CM | POA: Diagnosis not present

## 2018-03-07 DIAGNOSIS — Z510 Encounter for antineoplastic radiation therapy: Secondary | ICD-10-CM | POA: Diagnosis not present

## 2018-03-07 DIAGNOSIS — C50211 Malignant neoplasm of upper-inner quadrant of right female breast: Secondary | ICD-10-CM | POA: Diagnosis not present

## 2018-03-08 ENCOUNTER — Ambulatory Visit
Admission: RE | Admit: 2018-03-08 | Discharge: 2018-03-08 | Disposition: A | Discharge status: Home / Self Care | Payer: Medicare HMO | Source: Ambulatory Visit | Attending: Radiation Oncology | Admitting: Radiation Oncology

## 2018-03-08 DIAGNOSIS — C50211 Malignant neoplasm of upper-inner quadrant of right female breast: Secondary | ICD-10-CM | POA: Diagnosis not present

## 2018-03-08 DIAGNOSIS — Z510 Encounter for antineoplastic radiation therapy: Secondary | ICD-10-CM | POA: Diagnosis not present

## 2018-03-08 DIAGNOSIS — Z170 Estrogen receptor positive status [ER+]: Secondary | ICD-10-CM | POA: Diagnosis not present

## 2018-03-09 ENCOUNTER — Ambulatory Visit
Admission: RE | Admit: 2018-03-09 | Discharge: 2018-03-09 | Disposition: A | Discharge status: Home / Self Care | Payer: Medicare HMO | Source: Ambulatory Visit | Attending: Radiation Oncology | Admitting: Radiation Oncology

## 2018-03-09 DIAGNOSIS — Z170 Estrogen receptor positive status [ER+]: Secondary | ICD-10-CM | POA: Diagnosis not present

## 2018-03-09 DIAGNOSIS — C50211 Malignant neoplasm of upper-inner quadrant of right female breast: Secondary | ICD-10-CM | POA: Diagnosis not present

## 2018-03-09 DIAGNOSIS — Z510 Encounter for antineoplastic radiation therapy: Secondary | ICD-10-CM | POA: Diagnosis not present

## 2018-03-12 ENCOUNTER — Ambulatory Visit
Admission: RE | Admit: 2018-03-12 | Discharge: 2018-03-12 | Disposition: A | Discharge status: Home / Self Care | Payer: Medicare HMO | Source: Ambulatory Visit | Attending: Radiation Oncology | Admitting: Radiation Oncology

## 2018-03-12 ENCOUNTER — Ambulatory Visit: Payer: Medicare HMO | Admitting: Gynecology

## 2018-03-12 ENCOUNTER — Encounter: Payer: Self-pay | Admitting: Gynecology

## 2018-03-12 VITALS — BP 124/82 | Ht 62.5 in | Wt 140.0 lb

## 2018-03-12 DIAGNOSIS — Z510 Encounter for antineoplastic radiation therapy: Secondary | ICD-10-CM | POA: Insufficient documentation

## 2018-03-12 DIAGNOSIS — Z01411 Encounter for gynecological examination (general) (routine) with abnormal findings: Secondary | ICD-10-CM

## 2018-03-12 DIAGNOSIS — Z170 Estrogen receptor positive status [ER+]: Secondary | ICD-10-CM | POA: Diagnosis not present

## 2018-03-12 DIAGNOSIS — Z9189 Other specified personal risk factors, not elsewhere classified: Secondary | ICD-10-CM | POA: Diagnosis not present

## 2018-03-12 DIAGNOSIS — Z853 Personal history of malignant neoplasm of breast: Secondary | ICD-10-CM

## 2018-03-12 DIAGNOSIS — N368 Other specified disorders of urethra: Secondary | ICD-10-CM

## 2018-03-12 DIAGNOSIS — M810 Age-related osteoporosis without current pathological fracture: Secondary | ICD-10-CM

## 2018-03-12 DIAGNOSIS — C50211 Malignant neoplasm of upper-inner quadrant of right female breast: Secondary | ICD-10-CM | POA: Diagnosis not present

## 2018-03-12 DIAGNOSIS — N952 Postmenopausal atrophic vaginitis: Secondary | ICD-10-CM

## 2018-03-12 NOTE — Patient Instructions (Signed)
Follow-up in 1 year for annual exam 

## 2018-03-12 NOTE — Progress Notes (Signed)
    Marie Alvarez Aug 02, 1931 [id number]        84 y.o.  [id number] for breast and pelvic exam.  Recently diagnosed with right breast cancer status post lumpectomy and currently undergoing radiation.  History of left breast cancer in the past.  Also followed for suburethral cyst having recently cysts seen Dr. Rosana Hoes.  Followed for osteoporosis by Dr. Haynes Kerns.  Past medical history,surgical history, problem list, medications, allergies, family history and social history were all reviewed and documented as reviewed in the EPIC chart.  ROS:  Performed with pertinent positives and negatives included in the history, assessment and plan.   Additional significant findings : None   Exam: [name] assistant Vitals:   03/12/18 1059  BP: 124/82  Weight: 140 lb (63.5 kg)  Height: 5' 2.5" (1.588 m)   Body mass index is 25.2 kg/m.  General appearance:  Normal affect, orientation and appearance. Skin: Grossly normal HEENT: Without gross lesions.  No cervical or supraclavicular adenopathy. Thyroid normal.  Lungs:  Clear without wheezing, rales or rhonchi Cardiac: RR, without RMG Abdominal:  Soft, nontender, without masses, guarding, rebound, organomegaly or hernia Breasts:  Examined lying and sitting without masses, retractions, discharge or axillary adenopathy.  Well-healed bilateral lumpectomy scars with distortion. Pelvic:  Ext, BUS, Vagina: With atrophic changes.  2 cm midline submucosal urethral cyst a fingerbreadth within the introitus.  Nontender.  Cervix: Flush with upper vagina  Uterus: Difficult to palpate but no gross masses or tenderness  Adnexa: Without masses or tenderness    Anus and perineum: Normal   Rectovaginal: Normal sphincter tone without palpated masses or tenderness.    Assessment/Plan:  Marie Alvarez for breast and pelvic exam.   1. Suburethral cyst.  Recently saw Dr. Rosana Hoes.  Stable on serial exams asymptomatic to the patient. 2. Recent diagnoses of right breast  cancer.  Actively undergoing radiation and followed by oncology. 3. Pap smear 2012.  No Pap smear done today.  No history of abnormal Pap smears.  We both agree to stop screening per current screening guidelines based on age. 4. Colonoscopy over 10 years ago.  She relates that they have stopped doing colonoscopies based on age. 5. Osteoporosis.  Is followed by Dr. Haynes Kerns.  She will continue to follow-up with him in reference to this. 6. Health maintenance.  No routine lab work done as patient does this elsewhere.  Follow-up 1 year, sooner as needed.   [name] MD, 11:26 AM 03/12/2018

## 2018-03-13 ENCOUNTER — Ambulatory Visit
Admission: RE | Admit: 2018-03-13 | Discharge: 2018-03-13 | Disposition: A | Discharge status: Home / Self Care | Payer: Medicare HMO | Source: Ambulatory Visit | Attending: Radiation Oncology | Admitting: Radiation Oncology

## 2018-03-13 DIAGNOSIS — Z170 Estrogen receptor positive status [ER+]: Secondary | ICD-10-CM | POA: Diagnosis not present

## 2018-03-13 DIAGNOSIS — Z510 Encounter for antineoplastic radiation therapy: Secondary | ICD-10-CM | POA: Diagnosis not present

## 2018-03-13 DIAGNOSIS — C50211 Malignant neoplasm of upper-inner quadrant of right female breast: Secondary | ICD-10-CM | POA: Diagnosis not present

## 2018-03-14 ENCOUNTER — Ambulatory Visit
Admission: RE | Admit: 2018-03-14 | Discharge: 2018-03-14 | Disposition: A | Discharge status: Home / Self Care | Payer: Medicare HMO | Source: Ambulatory Visit | Attending: Radiation Oncology | Admitting: Radiation Oncology

## 2018-03-14 DIAGNOSIS — C50211 Malignant neoplasm of upper-inner quadrant of right female breast: Secondary | ICD-10-CM | POA: Diagnosis not present

## 2018-03-14 DIAGNOSIS — Z170 Estrogen receptor positive status [ER+]: Secondary | ICD-10-CM | POA: Diagnosis not present

## 2018-03-14 DIAGNOSIS — Z510 Encounter for antineoplastic radiation therapy: Secondary | ICD-10-CM | POA: Diagnosis not present

## 2018-03-15 ENCOUNTER — Ambulatory Visit
Admission: RE | Admit: 2018-03-15 | Discharge: 2018-03-15 | Disposition: A | Discharge status: Home / Self Care | Payer: Medicare HMO | Source: Ambulatory Visit | Attending: Radiation Oncology | Admitting: Radiation Oncology

## 2018-03-15 DIAGNOSIS — C50211 Malignant neoplasm of upper-inner quadrant of right female breast: Secondary | ICD-10-CM | POA: Diagnosis not present

## 2018-03-15 DIAGNOSIS — Z170 Estrogen receptor positive status [ER+]: Secondary | ICD-10-CM | POA: Diagnosis not present

## 2018-03-15 DIAGNOSIS — Z510 Encounter for antineoplastic radiation therapy: Secondary | ICD-10-CM | POA: Diagnosis not present

## 2018-03-16 ENCOUNTER — Ambulatory Visit
Admission: RE | Admit: 2018-03-16 | Discharge: 2018-03-16 | Disposition: A | Discharge status: Home / Self Care | Payer: Medicare HMO | Source: Ambulatory Visit | Attending: Radiation Oncology | Admitting: Radiation Oncology

## 2018-03-16 DIAGNOSIS — Z170 Estrogen receptor positive status [ER+]: Secondary | ICD-10-CM | POA: Diagnosis not present

## 2018-03-16 DIAGNOSIS — C50211 Malignant neoplasm of upper-inner quadrant of right female breast: Secondary | ICD-10-CM | POA: Diagnosis not present

## 2018-03-16 DIAGNOSIS — Z510 Encounter for antineoplastic radiation therapy: Secondary | ICD-10-CM | POA: Diagnosis not present

## 2018-03-19 ENCOUNTER — Ambulatory Visit
Admission: RE | Admit: 2018-03-19 | Discharge: 2018-03-19 | Disposition: A | Discharge status: Home / Self Care | Payer: Medicare HMO | Source: Ambulatory Visit | Attending: Radiation Oncology | Admitting: Radiation Oncology

## 2018-03-19 ENCOUNTER — Ambulatory Visit: Payer: Medicare HMO | Admitting: Radiation Oncology

## 2018-03-19 DIAGNOSIS — Z170 Estrogen receptor positive status [ER+]: Secondary | ICD-10-CM | POA: Diagnosis not present

## 2018-03-19 DIAGNOSIS — C50211 Malignant neoplasm of upper-inner quadrant of right female breast: Secondary | ICD-10-CM | POA: Diagnosis not present

## 2018-03-19 DIAGNOSIS — Z510 Encounter for antineoplastic radiation therapy: Secondary | ICD-10-CM | POA: Diagnosis not present

## 2018-03-20 ENCOUNTER — Ambulatory Visit
Admission: RE | Admit: 2018-03-20 | Discharge: 2018-03-20 | Disposition: A | Discharge status: Home / Self Care | Payer: Medicare HMO | Source: Ambulatory Visit | Attending: Radiation Oncology | Admitting: Radiation Oncology

## 2018-03-20 DIAGNOSIS — Z510 Encounter for antineoplastic radiation therapy: Secondary | ICD-10-CM | POA: Diagnosis not present

## 2018-03-20 DIAGNOSIS — Z170 Estrogen receptor positive status [ER+]: Secondary | ICD-10-CM | POA: Diagnosis not present

## 2018-03-20 DIAGNOSIS — C50211 Malignant neoplasm of upper-inner quadrant of right female breast: Secondary | ICD-10-CM | POA: Diagnosis not present

## 2018-03-21 ENCOUNTER — Ambulatory Visit
Admission: RE | Admit: 2018-03-21 | Discharge: 2018-03-21 | Disposition: A | Discharge status: Home / Self Care | Payer: Medicare HMO | Source: Ambulatory Visit | Attending: Radiation Oncology | Admitting: Radiation Oncology

## 2018-03-21 DIAGNOSIS — Z170 Estrogen receptor positive status [ER+]: Secondary | ICD-10-CM | POA: Diagnosis not present

## 2018-03-21 DIAGNOSIS — C50211 Malignant neoplasm of upper-inner quadrant of right female breast: Secondary | ICD-10-CM | POA: Diagnosis not present

## 2018-03-21 DIAGNOSIS — Z510 Encounter for antineoplastic radiation therapy: Secondary | ICD-10-CM | POA: Diagnosis not present

## 2018-03-22 ENCOUNTER — Ambulatory Visit
Admission: RE | Admit: 2018-03-22 | Discharge: 2018-03-22 | Disposition: A | Discharge status: Home / Self Care | Payer: Medicare HMO | Source: Ambulatory Visit | Attending: Radiation Oncology | Admitting: Radiation Oncology

## 2018-03-22 DIAGNOSIS — C50211 Malignant neoplasm of upper-inner quadrant of right female breast: Secondary | ICD-10-CM | POA: Diagnosis not present

## 2018-03-22 DIAGNOSIS — Z510 Encounter for antineoplastic radiation therapy: Secondary | ICD-10-CM | POA: Diagnosis not present

## 2018-03-22 DIAGNOSIS — Z170 Estrogen receptor positive status [ER+]: Secondary | ICD-10-CM | POA: Diagnosis not present

## 2018-03-23 ENCOUNTER — Ambulatory Visit
Admission: RE | Admit: 2018-03-23 | Discharge: 2018-03-23 | Disposition: A | Discharge status: Home / Self Care | Payer: Medicare HMO | Source: Ambulatory Visit | Attending: Radiation Oncology | Admitting: Radiation Oncology

## 2018-03-23 DIAGNOSIS — C50211 Malignant neoplasm of upper-inner quadrant of right female breast: Secondary | ICD-10-CM | POA: Diagnosis not present

## 2018-03-23 DIAGNOSIS — Z510 Encounter for antineoplastic radiation therapy: Secondary | ICD-10-CM | POA: Diagnosis not present

## 2018-03-23 DIAGNOSIS — Z170 Estrogen receptor positive status [ER+]: Secondary | ICD-10-CM | POA: Diagnosis not present

## 2018-03-26 ENCOUNTER — Inpatient Hospital Stay: Payer: Medicare HMO | Attending: Oncology | Admitting: Hematology and Oncology

## 2018-03-26 ENCOUNTER — Ambulatory Visit
Admission: RE | Admit: 2018-03-26 | Discharge: 2018-03-26 | Disposition: A | Discharge status: Home / Self Care | Payer: Medicare HMO | Source: Ambulatory Visit | Attending: Radiation Oncology | Admitting: Radiation Oncology

## 2018-03-26 ENCOUNTER — Telehealth: Payer: Self-pay | Admitting: Hematology and Oncology

## 2018-03-26 DIAGNOSIS — C50211 Malignant neoplasm of upper-inner quadrant of right female breast: Secondary | ICD-10-CM | POA: Diagnosis not present

## 2018-03-26 DIAGNOSIS — Z170 Estrogen receptor positive status [ER+]: Secondary | ICD-10-CM

## 2018-03-26 DIAGNOSIS — Z510 Encounter for antineoplastic radiation therapy: Secondary | ICD-10-CM | POA: Diagnosis not present

## 2018-03-26 DIAGNOSIS — L598 Other specified disorders of the skin and subcutaneous tissue related to radiation: Secondary | ICD-10-CM

## 2018-03-26 MED ORDER — LETROZOLE 2.5 MG PO TABS: 2.5000 mg | ORAL_TABLET | Freq: Every day | ORAL | 3 refills | Status: DC

## 2018-03-26 NOTE — Assessment & Plan Note (Signed)
01/12/18: Rt Lumpectomy: IDC grade 1 measuring 0.9 cm with ext DCIS, Margins Neg, ER 100%, PR 100%, Ki-67 2%, HER-2 negative ratio 1.19, T1bNx Stage 1A Adjuvant radiation therapy: 03/01/2018-03/26/2018  Treatment plan: Adjuvant antiestrogen therapy with letrozole 2.5 mg daily to start 04/11/2018  Letrozole counseling:We discussed the risks and benefits of anti-estrogen therapy with aromatase inhibitors. These include but not limited to insomnia, hot flashes, mood changes, vaginal dryness, bone density loss, and weight gain. We strongly believe that the benefits far outweigh the risks. Patient understands these risks and consented to starting treatment. Planned treatment duration is 5 years.  Return to clinic in 3 months for survivorship care plan visit and for toxicity evaluation on letrozole therapy

## 2018-03-26 NOTE — Telephone Encounter (Signed)
Gave avs and calendar ° °

## 2018-03-26 NOTE — Progress Notes (Signed)
Patient Care Team: Crist Infante, MD as PCP - General (Internal Medicine)  DIAGNOSIS:  Encounter Diagnosis  Name Primary?  . Malignant neoplasm of upper-inner quadrant of right breast in female, estrogen receptor positive ([place])     SUMMARY OF ONCOLOGIC HISTORY:   Malignant neoplasm of upper-outer quadrant of left breast in female, estrogen receptor positive ([place])   07/01/2002 Surgery    Left breast lumpectomy, stage 2 invasive ductal carcinoma with DCIS ER positive PR positive HER-2 negative, 3.4 cm tumor      07/01/2002 Pathologic Stage    Stage IIA: T2 N0      08/06/2002 - 11/06/2002 Chemotherapy    Adjuvant chemotherapy with CMF 6      12/06/2002 - 12/31/2002 Radiation Therapy    Adjuvant radiation therapy      01/29/2003 - 01/06/2009 Anti-estrogen oral therapy    Tamoxifen for 1 year then Arimidex 1 mg daily       Malignant neoplasm of upper-inner quadrant of right breast in female, estrogen receptor positive ([place])   2003 Miscellaneous    Left breast lumpectomy, stage 2 invasive ductal carcinoma with DCIS ER positive PR positive HER-2 negative, 3.4 cm tumor, CMF x6 followed by radiation followed by tamoxifen and later anastrozole completed 2010       11/29/2017 Initial Diagnosis    Right breast biopsy 4 o'clock position: Grade 1-2 IDC with DCIS with necrosis, ER 100%, PR 100%, Ki-67 2%, HER-2 negative ratio 1.19; 7 o'clock position fibroadenoma; ultrasound detected 3 o'clock position 2 x 1.5 x 1.7 cm, T1CN0 stage I a clinical stage      01/12/2018 Surgery    Rt Lumpectomy: IDC grade 1 measuring 0.9 cm with ext DCIS, Margins Neg, ER 100%, PR 100%, Ki-67 2%, HER-2 negative ratio 1.19, T1bNx Stage 1A        03/01/2018 - 03/26/2018 Radiation Therapy    Adjuvant radiation therapy       CHIEF COMPLIANT: Follow-up after radiation therapy  INTERVAL HISTORY: Marie Alvarez is a 84 year old with above-mentioned history of right breast cancer treated with lumpectomy and she  just finished adjuvant radiation therapy.  She is here today to discuss adjuvant treatment plan with antiestrogen therapy.  She had tolerated radiation extremely well.  She denies any pain or discomfort.  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  REVIEW OF SYSTEMS:   Constitutional: Denies fevers, chills or abnormal weight loss Eyes: Denies blurriness of vision Ears, nose, mouth, throat, and face: Denies mucositis or sore throat Respiratory: Denies cough, dyspnea or wheezes Cardiovascular: Denies palpitation, chest discomfort Gastrointestinal:  Denies nausea, heartburn or change in bowel habits Skin: Denies abnormal skin rashes Lymphatics: Denies new lymphadenopathy or easy bruising Neurological:Denies numbness, tingling or new weaknesses Behavioral/Psych: Mood is stable, no new changes  Extremities: No lower extremity edema Breast: Radiation dermatitis All other systems were reviewed with the patient and are negative.  I have reviewed the past medical history, past surgical history, social history and family history with the patient and they are unchanged from previous note.  ALLERGIES:  is allergic to prednisone and sulfamethoxazole.  MEDICATIONS:  Current Outpatient Medications  Medication Sig Dispense Refill  . acetaminophen (TYLENOL) 650 MG CR tablet Take 650 mg by mouth 2 (two) times daily as needed for pain.    Marland Kitchen amLODipine (NORVASC) 5 MG tablet Take 5 mg by mouth daily.     . Calcium Acetate POWD Take 3.3 g by mouth 2 (two) times daily.    Marland Kitchen  Carboxymethylcellul-Glycerin (LUBRICATING EYE DROPS OP) Place 1 drop into both eyes daily as needed (dry eyes).    . cholecalciferol (VITAMIN D) 1000 units tablet Take 1,000 Units by mouth daily.    Marland Kitchen letrozole (FEMARA) 2.5 MG tablet Take 1 tablet (2.5 mg total) by mouth daily. 90 tablet 3  . loratadine (CLARITIN) 10 MG tablet Take 10 mg by mouth daily.    Marland Kitchen losartan (COZAAR) 100 MG tablet Take 100 mg by mouth daily.      . Multiple Vitamins-Minerals (PRESERVISION AREDS 2 PO) Take 1 capsule by mouth 2 (two) times daily.    . nortriptyline (PAMELOR) 10 MG capsule Take 10 mg by mouth at bedtime.    . pantoprazole (PROTONIX) 40 MG tablet Take 40 mg by mouth daily. May take a second 40 mg dose as needed for acid reflux    . POTASSIUM PO Take by mouth.    . pravastatin (PRAVACHOL) 40 MG tablet Take 40 mg by mouth daily.    . vitamin B-12 (CYANOCOBALAMIN) 1000 MCG tablet Take 1,000 mcg by mouth daily.     No current facility-administered medications for this visit.     PHYSICAL EXAMINATION: ECOG PERFORMANCE STATUS: 1 - Symptomatic but completely ambulatory  Vitals:   03/26/18 1446  BP: (!) 160/72  Pulse: 85  Resp: 17  Temp: 97.6 F (36.4 C)  SpO2: 100%   Filed Weights   03/26/18 1446  Weight: 141 lb 9.6 oz (64.2 kg)    GENERAL:alert, no distress and comfortable SKIN: skin color, texture, turgor are normal, no rashes or significant lesions EYES: normal, Conjunctiva are pink and non-injected, sclera clear OROPHARYNX:no exudate, no erythema and lips, buccal mucosa, and tongue normal  NECK: supple, thyroid normal size, non-tender, without nodularity LYMPH:  no palpable lymphadenopathy in the cervical, axillary or inguinal LUNGS: clear to auscultation and percussion with normal breathing effort HEART: regular rate & rhythm and no murmurs and no lower extremity edema ABDOMEN:abdomen soft, non-tender and normal bowel sounds MUSCULOSKELETAL:no cyanosis of digits and no clubbing  NEURO: alert & oriented x 3 with fluent speech, no focal motor/sensory deficits EXTREMITIES: No lower extremity edema  LABORATORY DATA:  I have reviewed the data as listed CMP Latest Ref Rng & Units 01/08/2018 02/15/2017 02/06/2015  Glucose 65 - 99 mg/dL 102(H) 93 87  BUN 6 - 20 mg/dL 16 12.5 14.6  Creatinine 0.44 - 1.00 mg/dL 0.76 0.8 0.8  Sodium 135 - 145 mmol/L 137 140 142  Potassium 3.5 - 5.1 mmol/L 4.5 4.1 4.3  Chloride  101 - 111 mmol/L 103 - -  CO2 22 - 32 mmol/L 25 29 28   Calcium 8.9 - 10.3 mg/dL 9.7 10.5(H) 10.4  Total Protein 6.4 - 8.3 g/dL - 7.2 7.1  Total Bilirubin 0.20 - 1.20 mg/dL - 0.70 0.61  Alkaline Phos 40 - 150 U/L - 75 71  AST 5 - 34 U/L - 16 14  ALT 0 - 55 U/L - 11 8    Lab Results  Component Value Date   WBC 6.0 01/08/2018   HGB 15.0 01/08/2018   HCT 46.3 (H) 01/08/2018   MCV 91.9 01/08/2018   PLT 179 01/08/2018   NEUTROABS 4.0 02/15/2017    ASSESSMENT & PLAN:  Malignant neoplasm of upper-inner quadrant of right breast in female, estrogen receptor positive ([place]) 01/12/18: Rt Lumpectomy: IDC grade 1 measuring 0.9 cm with ext DCIS, Margins Neg, ER 100%, PR 100%, Ki-67 2%, HER-2 negative ratio 1.19, T1bNx Stage 1A Adjuvant radiation therapy:  03/01/2018-03/26/2018  Treatment plan: Adjuvant antiestrogen therapy with letrozole 2.5 mg daily to start 04/11/2018  Letrozole counseling:We discussed the risks and benefits of anti-estrogen therapy with aromatase inhibitors. These include but not limited to insomnia, hot flashes, mood changes, vaginal dryness, bone density loss, and weight gain. We strongly believe that the benefits far outweigh the risks. Patient understands these risks and consented to starting treatment. Planned treatment duration is 5 years.  Return to clinic in 3 months for survivorship care plan visit and for toxicity evaluation on letrozole therapy  No orders of the defined types were placed in this encounter.  The patient has a good understanding of the overall plan. she agrees with it. she will call with any problems that may develop before the next visit here.   Harriette Ohara, MD 03/26/18

## 2018-03-27 ENCOUNTER — Ambulatory Visit
Admission: RE | Admit: 2018-03-27 | Discharge: 2018-03-27 | Disposition: A | Discharge status: Home / Self Care | Payer: Medicare HMO | Source: Ambulatory Visit | Attending: Radiation Oncology | Admitting: Radiation Oncology

## 2018-03-27 DIAGNOSIS — C50211 Malignant neoplasm of upper-inner quadrant of right female breast: Secondary | ICD-10-CM | POA: Diagnosis not present

## 2018-03-27 DIAGNOSIS — Z510 Encounter for antineoplastic radiation therapy: Secondary | ICD-10-CM | POA: Diagnosis not present

## 2018-03-27 DIAGNOSIS — Z170 Estrogen receptor positive status [ER+]: Secondary | ICD-10-CM | POA: Diagnosis not present

## 2018-03-28 ENCOUNTER — Ambulatory Visit
Admission: RE | Admit: 2018-03-28 | Discharge: 2018-03-28 | Disposition: A | Discharge status: Home / Self Care | Payer: Medicare HMO | Source: Ambulatory Visit | Attending: Radiation Oncology | Admitting: Radiation Oncology

## 2018-03-28 DIAGNOSIS — Z170 Estrogen receptor positive status [ER+]: Secondary | ICD-10-CM | POA: Diagnosis not present

## 2018-03-28 DIAGNOSIS — Z510 Encounter for antineoplastic radiation therapy: Secondary | ICD-10-CM | POA: Diagnosis not present

## 2018-03-28 DIAGNOSIS — C50211 Malignant neoplasm of upper-inner quadrant of right female breast: Secondary | ICD-10-CM | POA: Diagnosis not present

## 2018-03-29 DIAGNOSIS — I10 Essential (primary) hypertension: Secondary | ICD-10-CM | POA: Diagnosis not present

## 2018-03-29 DIAGNOSIS — C50919 Malignant neoplasm of unspecified site of unspecified female breast: Secondary | ICD-10-CM | POA: Diagnosis not present

## 2018-03-29 DIAGNOSIS — I872 Venous insufficiency (chronic) (peripheral): Secondary | ICD-10-CM | POA: Diagnosis not present

## 2018-03-29 DIAGNOSIS — Z6825 Body mass index (BMI) 25.0-25.9, adult: Secondary | ICD-10-CM | POA: Diagnosis not present

## 2018-04-10 ENCOUNTER — Encounter: Payer: Self-pay | Admitting: Radiation Oncology

## 2018-04-10 NOTE — Progress Notes (Signed)
  Radiation Oncology         [phone] ________________________________  Name: Marie Alvarez MRN: [id number]  Date: 04/10/2018  DOB: December 31, 1930  End of Treatment Note  Diagnosis:   StageIA T1cN0M0 RightBreast UIQ Invasive Ductal Carcinoma, ER100%/ PR 100%/ Her2 negative, GradeI-II      Indication for treatment:  Curative       Radiation treatment dates:   03/01/18 - 03/28/18  Site/dose:   Right breast treated to 40.05 Gy in 15 fx followed by a boost of 10 Gy in 5 fx  Beams/energy:   3D / 6X  Narrative: The patient tolerated radiation treatment relatively well.   She did endorse some soreness at the right breast throughout treatment.  Plan: The patient has completed radiation treatment. The patient will return to radiation oncology clinic for routine followup in one month. I advised them to call or return sooner if they have any questions or concerns related to their recovery or treatment.  -----------------------------------  Eppie Gibson, MD  This document serves as a record of services personally performed by Eppie Gibson, MD. It was created on his behalf by Linward Natal, a trained medical scribe. The creation of this record is based on the scribe's personal observations and the provider's statements to them. This document has been checked and approved by the attending provider.

## 2018-04-11 DIAGNOSIS — I10 Essential (primary) hypertension: Secondary | ICD-10-CM | POA: Diagnosis not present

## 2018-04-23 DIAGNOSIS — I10 Essential (primary) hypertension: Secondary | ICD-10-CM | POA: Diagnosis not present

## 2018-04-26 DIAGNOSIS — N390 Urinary tract infection, site not specified: Secondary | ICD-10-CM | POA: Diagnosis not present

## 2018-04-26 DIAGNOSIS — R300 Dysuria: Secondary | ICD-10-CM | POA: Diagnosis not present

## 2018-05-03 ENCOUNTER — Encounter: Payer: Self-pay | Admitting: Radiation Oncology

## 2018-05-03 DIAGNOSIS — L309 Dermatitis, unspecified: Secondary | ICD-10-CM | POA: Diagnosis not present

## 2018-05-03 DIAGNOSIS — I8310 Varicose veins of unspecified lower extremity with inflammation: Secondary | ICD-10-CM | POA: Diagnosis not present

## 2018-05-03 NOTE — Progress Notes (Signed)
Marie Alvarez presents for follow up of radiation completed 03/28/18 to her Right Breast. Her right breast is slightly red. She is not using any type of cream since she completed the radiaplex. She was advised to begin using a vitamin E containing lotion. She saw Dr. Lindi Adie on 03/26/18 and began taking Letrozole 2.5 mg daily on 04/11/18. She has an appointment with survivorship on 06/25/18.   BP (!) 159/66   Pulse 77   Temp 97.9 F (36.6 C)   Resp 18   Wt 135 lb (61.2 kg)   SpO2 100% Comment: room air  BMI 24.30 kg/m    Wt Readings from Last 3 Encounters:  05/11/18 135 lb (61.2 kg)  03/26/18 141 lb 9.6 oz (64.2 kg)  03/12/18 140 lb (63.5 kg)

## 2018-05-11 ENCOUNTER — Other Ambulatory Visit: Payer: Self-pay

## 2018-05-11 ENCOUNTER — Encounter: Payer: Self-pay | Admitting: Radiation Oncology

## 2018-05-11 ENCOUNTER — Ambulatory Visit
Admission: RE | Admit: 2018-05-11 | Discharge: 2018-05-11 | Disposition: A | Discharge status: Home / Self Care | Payer: Medicare HMO | Source: Ambulatory Visit | Attending: Radiation Oncology | Admitting: Radiation Oncology

## 2018-05-11 VITALS — BP 159/66 | HR 77 | Temp 97.9°F | Resp 18 | Wt 135.0 lb

## 2018-05-11 DIAGNOSIS — Z79899 Other long term (current) drug therapy: Secondary | ICD-10-CM | POA: Insufficient documentation

## 2018-05-11 DIAGNOSIS — C50211 Malignant neoplasm of upper-inner quadrant of right female breast: Secondary | ICD-10-CM | POA: Diagnosis not present

## 2018-05-11 DIAGNOSIS — Z79811 Long term (current) use of aromatase inhibitors: Secondary | ICD-10-CM | POA: Insufficient documentation

## 2018-05-11 DIAGNOSIS — L299 Pruritus, unspecified: Secondary | ICD-10-CM | POA: Insufficient documentation

## 2018-05-11 DIAGNOSIS — Z170 Estrogen receptor positive status [ER+]: Secondary | ICD-10-CM

## 2018-05-11 HISTORY — DX: Personal history of irradiation: Z92.3

## 2018-05-11 NOTE — Progress Notes (Signed)
Radiation Oncology         [phone] ________________________________  Name: Marie Alvarez MRN: [id number]  Date: 05/11/2018  DOB: 10-21-1931  Follow-Up Visit Note  Outpatient  CC: Crist Infante, MD  Crist Infante, MD  Diagnosis:   Georgeann [place]en[place] RightBreast UIQ Invasive Ductal Carcinoma, ER100%/ PR 100%/ Her2 negative, GradeI-II    ICD-10-CM   1. Malignant neoplasm of upper-inner quadrant of right breast in female, estrogen receptor positive ([place]) C50.211    Z17.0      Previous Radiation:  40.05 Gy directed to the Right Breast delivered in 15 fractions, followed by a boost of 10 Gy in 5 fractions, completed on 03/01/18 - 03/28/18.  Narrative:  The patient returns today for routine follow-up.  She is doing well. She denies any new palpable lumps or bumps in her breasts. She continues to be followed by medical oncology. Her last mammogram was on 01/10/18  it was BI-RADS. She reported that she feels itchy all over her skin and being concerned about not having it clean enough.. She is not using any type of cream since she completed the radiaplex. She was advised to begin using a vitamin E containing lotion. She saw Dr. Lindi Adie on 03/26/18 and began taking Letrozole 2.5 mg daily on 04/11/18.    ALLERGIES:  is allergic to prednisone and sulfamethoxazole.  Meds: Current Outpatient Medications  Medication Sig Dispense Refill  . acetaminophen (TYLENOL) 650 MG CR tablet Take 650 mg by mouth 2 (two) times daily as needed for pain.    Marland Kitchen amLODipine (NORVASC) 5 MG tablet Take 5 mg by mouth daily.     . Calcium Acetate POWD Take 3.3 g by mouth 2 (two) times daily.    . Carboxymethylcellul-Glycerin (LUBRICATING EYE DROPS OP) Place 1 drop into both eyes daily as needed (dry eyes).    . cholecalciferol (VITAMIN D) 1000 units tablet Take 1,000 Units by mouth daily.    Marland Kitchen letrozole (FEMARA) 2.5 MG tablet Take 1 tablet (2.5 mg total) by mouth daily. 90 tablet 3  . loratadine (CLARITIN) 10 MG tablet  Take 10 mg by mouth daily.    Marland Kitchen losartan (COZAAR) 100 MG tablet Take 100 mg by mouth daily.    . Multiple Vitamins-Minerals (PRESERVISION AREDS 2 PO) Take 1 capsule by mouth 2 (two) times daily.    . nortriptyline (PAMELOR) 10 MG capsule Take 10 mg by mouth at bedtime.    . pantoprazole (PROTONIX) 40 MG tablet Take 40 mg by mouth daily. May take a second 40 mg dose as needed for acid reflux    . POTASSIUM PO Take by mouth.    . pravastatin (PRAVACHOL) 40 MG tablet Take 40 mg by mouth daily.    . vitamin B-12 (CYANOCOBALAMIN) 1000 MCG tablet Take 1,000 mcg by mouth daily.     No current facility-administered medications for this encounter.     Physical Findings: The patient is in no acute distress. Patient is alert and oriented.  weight is 135 lb (61.2 kg). Her temperature is 97.9 F (36.6 C). Her blood pressure is 159/66 (abnormal) and her pulse is 77. Her respiration is 18 and oxygen saturation is 100%.  Her right breast has healed very well with minimal residual of hyperpigmentation; there is dead skin in the crease at the surgical scan, I used a Q-tip to clean this dead skin that was left in the crease under the nipple.  Lab Findings: Lab Results  Component Value Date   WBC 6.0  01/08/2018   HGB 15.0 01/08/2018   HCT 46.3 (H) 01/08/2018   MCV 91.9 01/08/2018   PLT 179 01/08/2018    @LASTCHEMISTRY @  Radiographic Findings: No results found.  Impression/Plan: This is a very pleasant woman with a history of right breast cancer.  She knows continue yearly mammography. I will see her back PRN. Advised her to continued using vitamin E lotion around breast but to avoid crease.. I encouraged her to call if she has any issues in the interim. She has an appointment with survivorship on 06/25/18.     _____________________________________   Eppie Gibson, MD  This document serves as a record of services personally performed by Eppie Gibson MD. It was created on her behalf by Delton Coombes, a trained medical scribe. The creation of this record is based on the scribe's personal observations and the provider's statements to them.

## 2018-05-24 DIAGNOSIS — J302 Other seasonal allergic rhinitis: Secondary | ICD-10-CM | POA: Diagnosis not present

## 2018-05-24 DIAGNOSIS — Z6825 Body mass index (BMI) 25.0-25.9, adult: Secondary | ICD-10-CM | POA: Diagnosis not present

## 2018-05-24 DIAGNOSIS — L309 Dermatitis, unspecified: Secondary | ICD-10-CM | POA: Diagnosis not present

## 2018-06-20 ENCOUNTER — Telehealth: Payer: Self-pay

## 2018-06-20 NOTE — Telephone Encounter (Signed)
LVM for patient reminding of SCP visit with [place] on 06/25/18 at 10 am.  Center call back number left with patient for questions.

## 2018-06-25 ENCOUNTER — Inpatient Hospital Stay: Payer: Medicare HMO

## 2018-06-25 ENCOUNTER — Encounter: Payer: Self-pay | Admitting: Adult Health

## 2018-06-25 ENCOUNTER — Other Ambulatory Visit: Payer: Medicare HMO

## 2018-06-25 ENCOUNTER — Telehealth: Payer: Self-pay

## 2018-06-25 ENCOUNTER — Inpatient Hospital Stay: Payer: Medicare HMO | Attending: Adult Health | Admitting: Adult Health

## 2018-06-25 VITALS — BP 140/72 | HR 79 | Temp 98.5°F | Resp 14 | Ht 62.5 in | Wt 141.3 lb

## 2018-06-25 DIAGNOSIS — C50211 Malignant neoplasm of upper-inner quadrant of right female breast: Secondary | ICD-10-CM

## 2018-06-25 DIAGNOSIS — Z79899 Other long term (current) drug therapy: Secondary | ICD-10-CM | POA: Insufficient documentation

## 2018-06-25 DIAGNOSIS — I959 Hypotension, unspecified: Secondary | ICD-10-CM | POA: Diagnosis not present

## 2018-06-25 DIAGNOSIS — Z79811 Long term (current) use of aromatase inhibitors: Secondary | ICD-10-CM | POA: Diagnosis not present

## 2018-06-25 DIAGNOSIS — Z170 Estrogen receptor positive status [ER+]: Secondary | ICD-10-CM

## 2018-06-25 LAB — URINALYSIS, COMPLETE (UACMP) WITH MICROSCOPIC
Bilirubin Urine: NEGATIVE
GLUCOSE, UA: NEGATIVE mg/dL
HGB URINE DIPSTICK: NEGATIVE
Ketones, ur: NEGATIVE mg/dL
Leukocytes, UA: NEGATIVE
NITRITE: NEGATIVE
Protein, ur: NEGATIVE mg/dL
SPECIFIC GRAVITY, URINE: 1.016 (ref 1.005–1.030)
pH: 5 (ref 5.0–8.0)

## 2018-06-25 LAB — CBC WITH DIFFERENTIAL (CANCER CENTER ONLY)
Basophils Absolute: 0 10*3/uL (ref 0.0–0.1)
Basophils Relative: 0 %
Eosinophils Absolute: 0.1 10*3/uL (ref 0.0–0.5)
Eosinophils Relative: 2 %
HEMATOCRIT: 43.8 % (ref 34.8–46.6)
HEMOGLOBIN: 14.4 g/dL (ref 11.6–15.9)
LYMPHS ABS: 1.4 10*3/uL (ref 0.9–3.3)
LYMPHS PCT: 25 %
MCH: 30.1 pg (ref 25.1–34.0)
MCHC: 32.9 g/dL (ref 31.5–36.0)
MCV: 91.6 fL (ref 79.5–101.0)
Monocytes Absolute: 0.5 10*3/uL (ref 0.1–0.9)
Monocytes Relative: 8 %
Neutro Abs: 3.7 10*3/uL (ref 1.5–6.5)
Neutrophils Relative %: 65 %
Platelet Count: 162 10*3/uL (ref 145–400)
RBC: 4.78 MIL/uL (ref 3.70–5.45)
RDW: 13.5 % (ref 11.2–14.5)
WBC: 5.7 10*3/uL (ref 3.9–10.3)

## 2018-06-25 LAB — CMP (CANCER CENTER ONLY)
ALK PHOS: 72 U/L (ref 38–126)
ALT: 17 U/L (ref 0–44)
AST: 19 U/L (ref 15–41)
Albumin: 4.5 g/dL (ref 3.5–5.0)
Anion gap: 8 (ref 5–15)
BILIRUBIN TOTAL: 0.5 mg/dL (ref 0.3–1.2)
BUN: 17 mg/dL (ref 8–23)
CALCIUM: 10 mg/dL (ref 8.9–10.3)
CO2: 26 mmol/L (ref 22–32)
CREATININE: 0.78 mg/dL (ref 0.44–1.00)
Chloride: 105 mmol/L (ref 98–111)
Glucose, Bld: 83 mg/dL (ref 70–99)
Potassium: 4 mmol/L (ref 3.5–5.1)
Sodium: 139 mmol/L (ref 135–145)
Total Protein: 7.4 g/dL (ref 6.5–8.1)

## 2018-06-25 NOTE — Telephone Encounter (Signed)
Spoke with patient to inform that labs and urine look ok per LC.  Patient voiced understanding and had no questions at this time.  Knows to call center with any issues.

## 2018-06-25 NOTE — Progress Notes (Signed)
CLINIC:  Survivorship   REASON FOR VISIT:  Routine follow-up post-treatment for a recent history of breast cancer.  BRIEF ONCOLOGIC HISTORY:    Malignant neoplasm of upper-outer quadrant of left breast in female, estrogen receptor positive ([place])   07/01/2002 Surgery    Left breast lumpectomy, stage 2 invasive ductal carcinoma with DCIS ER positive PR positive HER-2 negative, 3.4 cm tumor      07/01/2002 Pathologic Stage    Stage IIA: T2 N0      08/06/2002 - 11/06/2002 Chemotherapy    Adjuvant chemotherapy with CMF 6      12/06/2002 - 12/31/2002 Radiation Therapy    Adjuvant radiation therapy      01/29/2003 - 01/06/2009 Anti-estrogen oral therapy    Tamoxifen for 1 year then Arimidex 1 mg daily       Malignant neoplasm of upper-inner quadrant of right breast in female, estrogen receptor positive ([place])   2003 Miscellaneous    Left breast lumpectomy, stage 2 invasive ductal carcinoma with DCIS ER positive PR positive HER-2 negative, 3.4 cm tumor, CMF x6 followed by radiation followed by tamoxifen and later anastrozole completed 2010       11/29/2017 Initial Diagnosis    Right breast biopsy 4 o'clock position: Grade 1-2 IDC with DCIS with necrosis, ER 100%, PR 100%, Ki-67 2%, HER-2 negative ratio 1.19; 7 o'clock position fibroadenoma; ultrasound detected 3 o'clock position 2 x 1.5 x 1.7 cm, T1CN0 stage I a clinical stage      01/12/2018 Surgery    Rt Lumpectomy: IDC grade 1 measuring 0.9 cm with ext DCIS, Margins Neg, ER 100%, PR 100%, Ki-67 2%, HER-2 negative ratio 1.19, T1bNx Stage 1A        03/01/2018 - 03/26/2018 Radiation Therapy    Adjuvant radiation therapy      04/2018 -  Anti-estrogen oral therapy    Letrozole daily       INTERVAL HISTORY:  Marie Alvarez presents to the [facility] today for our initial meeting to review her survivorship care plan detailing her treatment course for breast cancer, as well as monitoring long-term side effects of that  treatment, education regarding health maintenance, screening, and overall wellness and health promotion.     Overall, Marie Alvarez reports feeling quite well.  She is having some post surgical breast pain at her lumpectomy site.  She is tolerating the Letrozole well.  She denies any new issues today.  She did have a blood pressure that was 80s over 50s today and she notes her head doesn't feel quite right.  She denies dizziness or light headedness.  She denies chest pain/palpitations.     REVIEW OF SYSTEMS:  Review of Systems  Constitutional: Negative for appetite change, chills, fatigue, fever and unexpected weight change.  HENT:   Negative for hearing loss, lump/mass and trouble swallowing.   Eyes: Negative for eye problems and icterus.  Respiratory: Negative for chest tightness, cough and shortness of breath.   Cardiovascular: Negative for chest pain, leg swelling and palpitations.  Gastrointestinal: Negative for abdominal distention, abdominal pain, constipation, diarrhea, nausea and vomiting.  Endocrine: Negative for hot flashes.  Musculoskeletal: Negative for arthralgias.  Neurological: Negative for dizziness, extremity weakness, headaches, light-headedness and numbness.  Hematological: Negative for adenopathy. Does not bruise/bleed easily.  Psychiatric/Behavioral: Negative for depression. The patient is not nervous/anxious.    Breast: Denies any new nodularity, masses, tenderness, nipple changes, or nipple discharge.      ONCOLOGY TREATMENT TEAM:  1. Surgeon:  Dr.  Dalbert Batman at [facility] 2. Medical Oncologist: Dr. Lindi Adie  3. Radiation Oncologist: Dr. Isidore Moos    PAST MEDICAL/SURGICAL HISTORY:  Past Medical History:  Diagnosis Date  . Arthritis   . Breast cancer ([place]) 2018   Right  . Cancer ([place]) 2003   BREAST-LEFT  . GERD (gastroesophageal reflux disease)   . History of radiation therapy 03/01/18- 03/28/18   Right breast treated to 40.05 Gy in 15 fx followed by a  boost of 10 Gy in 5 fx  . Hx estrogen therapy 02/02/2012  . Hyperlipidemia   . Hypertension   . Osteoporosis   . Scoliosis   . Shingles   . Suburethral cyst 03/2012   Past Surgical History:  Procedure Laterality Date  . APPENDECTOMY  1946  . BREAST LUMPECTOMY Left 2003  . BREAST LUMPECTOMY WITH RADIOACTIVE SEED LOCALIZATION Right 01/12/2018   Procedure: RIGHT BREAST LUMPECTOMY WITH RADIOACTIVE SEED LOCALIZATION;  Surgeon: Fanny Skates, MD;  Location: [place];  Service: General;  Laterality: Right;  . BREAST SURGERY  07-2002   LEFT LUMPECTOMY  . TONSILECTOMY, ADENOIDECTOMY, BILATERAL MYRINGOTOMY AND TUBES       ALLERGIES:  Allergies  Allergen Reactions  . Prednisone Other (See Comments)    "makes me goofy"  . Sulfamethoxazole Rash     CURRENT MEDICATIONS:  Outpatient Encounter Medications as of 06/25/2018  Medication Sig  . acetaminophen (TYLENOL) 650 MG CR tablet Take 650 mg by mouth 2 (two) times daily as needed for pain.  Marland Kitchen amLODipine (NORVASC) 5 MG tablet Take 5 mg by mouth daily.   . Calcium Acetate POWD Take 3.3 g by mouth 2 (two) times daily.  . Carboxymethylcellul-Glycerin (LUBRICATING EYE DROPS OP) Place 1 drop into both eyes daily as needed (dry eyes).  . cholecalciferol (VITAMIN D) 1000 units tablet Take 1,000 Units by mouth daily.  Marland Kitchen letrozole (FEMARA) 2.5 MG tablet Take 1 tablet (2.5 mg total) by mouth daily.  Marland Kitchen loratadine (CLARITIN) 10 MG tablet Take 10 mg by mouth daily.  Marland Kitchen losartan (COZAAR) 100 MG tablet Take 100 mg by mouth daily.  . Multiple Vitamins-Minerals (PRESERVISION AREDS 2 PO) Take 1 capsule by mouth 2 (two) times daily.  . nortriptyline (PAMELOR) 10 MG capsule Take 10 mg by mouth at bedtime.  . pantoprazole (PROTONIX) 40 MG tablet Take 40 mg by mouth daily. May take a second 40 mg dose as needed for acid reflux  . POTASSIUM PO Take by mouth.  . pravastatin (PRAVACHOL) 40 MG tablet Take 40 mg by mouth daily.  . vitamin B-12 (CYANOCOBALAMIN) 1000 MCG  tablet Take 1,000 mcg by mouth daily.   No facility-administered encounter medications on file as of 06/25/2018.      ONCOLOGIC FAMILY HISTORY:  Family History  Problem Relation Age of Onset  . Heart disease Mother        Angina  . Heart disease Father        Unknown      SOCIAL HISTORY:  Social History   Socioeconomic History  . Marital status: Widowed    Spouse name: Not on file  . Number of children: 2  . Years of education: Not on file  . Highest education level: Not on file  Occupational History  . Not on file  Social Needs  . Financial resource strain: Not on file  . Food insecurity:    Worry: Not on file    Inability: Not on file  . Transportation needs:    Medical: Not on file  Non-medical: Not on file  Tobacco Use  . Smoking status: Never Smoker  . Smokeless tobacco: Never Used  Substance and Sexual Activity  . Alcohol use: No    Alcohol/week: 0.0 oz  . Drug use: No  . Sexual activity: Not Currently    Birth control/protection: Post-menopausal    Comment: 1st intercourse 18 yo--1 partner  Lifestyle  . Physical activity:    Days per week: Not on file    Minutes per session: Not on file  . Stress: Not on file  Relationships  . Social connections:    Talks on phone: Not on file    Gets together: Not on file    Attends religious service: Not on file    Active member of club or organization: Not on file    Attends meetings of clubs or organizations: Not on file    Relationship status: Not on file  . Intimate partner violence:    Fear of current or ex partner: Not on file    Emotionally abused: Not on file    Physically abused: Not on file    Forced sexual activity: Not on file  Other Topics Concern  . Not on file  Social History Narrative  . Not on file     PHYSICAL EXAMINATION:  Vital Signs:   Vitals:   06/25/18 1023  BP: (!) 84/55  Pulse: 76  Resp: 14  Temp: 98.5 F (36.9 C)  SpO2: 99%   Filed Weights   06/25/18 1023  Weight:  141 lb 4.8 oz (64.1 kg)   General: Well-nourished, well-appearing female in no acute distress.  She is unaccompanied today. HEENT: Head is normocephalic.  Pupils equal and reactive to light. Conjunctivae clear without exudate.  Sclerae anicteric. Oral mucosa is pink, moist.  Oropharynx is pink without lesions or erythema.  Lymph: No cervical, supraclavicular, or infraclavicular lymphadenopathy noted on palpation.  Cardiovascular: Regular rate and rhythm.Marland Kitchen Respiratory: Clear to auscultation bilaterally. Chest expansion symmetric; breathing non-labored.  Breasts: right breast s/p lumpectomy, mod amt of distortion and minimal scar tissue present, no nodules or masses, left breast without nodules, masses, skin or nipple changes. GI: Abdomen soft and round; non-tender, non-distended. Bowel sounds normoactive.  GU: Deferred.  Neuro: No focal deficits. Steady gait.  Psych: Mood and affect normal and appropriate for situation.  Extremities: No edema. MSK: No focal spinal tenderness to palpation.  Full range of motion in bilateral upper extremities Skin: Warm and dry.  LABORATORY DATA:  Appointment on 06/25/2018  Component Date Value Ref Range Status  . WBC Count 06/25/2018 5.7  3.9 - 10.3 K/uL Final  . RBC 06/25/2018 4.78  3.70 - 5.45 MIL/uL Final  . Hemoglobin 06/25/2018 14.4  11.6 - 15.9 g/dL Final  . HCT 06/25/2018 43.8  34.8 - 46.6 % Final  . MCV 06/25/2018 91.6  79.5 - 101.0 fL Final  . MCH 06/25/2018 30.1  25.1 - 34.0 pg Final  . MCHC 06/25/2018 32.9  31.5 - 36.0 g/dL Final  . RDW 06/25/2018 13.5  11.2 - 14.5 % Final  . Platelet Count 06/25/2018 162  145 - 400 K/uL Final  . Neutrophils Relative % 06/25/2018 65  % Final  . Neutro Abs 06/25/2018 3.7  1.5 - 6.5 K/uL Final  . Lymphocytes Relative 06/25/2018 25  % Final  . Lymphs Abs 06/25/2018 1.4  0.9 - 3.3 K/uL Final  . Monocytes Relative 06/25/2018 8  % Final  . Monocytes Absolute 06/25/2018 0.5  0.1 - 0.9 K/uL  Final  . Eosinophils  Relative 06/25/2018 2  % Final  . Eosinophils Absolute 06/25/2018 0.1  0.0 - 0.5 K/uL Final  . Basophils Relative 06/25/2018 0  % Final  . Basophils Absolute 06/25/2018 0.0  0.0 - 0.1 K/uL Final   Performed at [facility], [place] [address] [place], [place] [address]  . Sodium 06/25/2018 139  135 - 145 mmol/L Final   Please note reference intervals were recently updated.  . Potassium 06/25/2018 4.0  3.5 - 5.1 mmol/L Final  . Chloride 06/25/2018 105  98 - 111 mmol/L Final  . CO2 06/25/2018 26  22 - 32 mmol/L Final  . Glucose, Bld 06/25/2018 83  70 - 99 mg/dL Final  . BUN 06/25/2018 17  8 - 23 mg/dL Final   Please note change in reference range.  . Creatinine 06/25/2018 0.78  0.44 - 1.00 mg/dL Final  . Calcium 06/25/2018 10.0  8.9 - 10.3 mg/dL Final  . Total Protein 06/25/2018 7.4  6.5 - 8.1 g/dL Final  . Albumin 06/25/2018 4.5  3.5 - 5.0 g/dL Final  . AST 06/25/2018 19  15 - 41 U/L Final  . ALT 06/25/2018 17  0 - 44 U/L Final  . Alkaline Phosphatase 06/25/2018 72  38 - 126 U/L Final  . Total Bilirubin 06/25/2018 0.5  0.3 - 1.2 mg/dL Final  . GFR, Est Non Af Am 06/25/2018 >60  >60 mL/min Final  . GFR, Est AFR Am 06/25/2018 >60  >60 mL/min Final   Comment: (NOTE) The eGFR has been calculated using the CKD EPI equation. This calculation has not been validated in all clinical situations. eGFR's persistently <60 mL/min signify possible Chronic Kidney Disease.   Georgiann Hahn gap 06/25/2018 8  5 - 15 Final   Performed at [facility], [place] [address], [place], [place] [address]  . Color, Urine 06/25/2018 YELLOW  YELLOW Final  . APPearance 06/25/2018 CLEAR  CLEAR Final  . Specific Gravity, Urine 06/25/2018 1.016  1.005 - 1.030 Final  . pH 06/25/2018 5.0  5.0 - 8.0 Final  . Glucose, UA 06/25/2018 NEGATIVE  NEGATIVE mg/dL Final  . Hgb urine dipstick 06/25/2018 NEGATIVE  NEGATIVE Final  . Bilirubin Urine 06/25/2018 NEGATIVE  NEGATIVE Final  .  Ketones, ur 06/25/2018 NEGATIVE  NEGATIVE mg/dL Final  . Protein, ur 06/25/2018 NEGATIVE  NEGATIVE mg/dL Final  . Nitrite 06/25/2018 NEGATIVE  NEGATIVE Final  . Leukocytes, UA 06/25/2018 NEGATIVE  NEGATIVE Final  . RBC / HPF 06/25/2018 0-5  0 - 5 RBC/hpf Final  . WBC, UA 06/25/2018 0-5  0 - 5 WBC/hpf Final  . Bacteria, UA 06/25/2018 RARE* NONE SEEN Final  . Squamous Epithelial / LPF 06/25/2018 0-5  0 - 5 Final  . Mucus 06/25/2018 PRESENT   Final   Performed at [facility], [place] [address], [place], [place] [address]    DIAGNOSTIC IMAGING:  None for this visit.      ASSESSMENT AND PLAN:  Ms.. Alvarez is a pleasant 84 y.o. female with Stage IA right breast invasive ductal carcinoma, ER+/PR+/HER2-, diagnosed in 11/2017, treated with lumpectomy, adjuvant radiation therapy, and anti-estrogen therapy with Letrozole beginning in 04/2018.  She also has remote h/o stage II left breast invasive ductal carcinoma from 2003, ER/PR positive treated with lumpectomy, adjuvant chemotherapy, adjuvant radiation, and Tamoxifen followed by Anastrozole that completed in 2010.  She presents to the Survivorship Clinic for our initial meeting and routine follow-up post-completion of treatment  for breast cancer.    1. Stage IA right breast cancer:  Marie Alvarez is continuing to recover from definitive treatment for breast cancer. She will follow-up with her medical oncologist, Dr. Lindi Adie in 6 months with history and physical exam per surveillance protocol.  She will continue her anti-estrogen therapy with Letrozole. Thus far, she is tolerating the Letrozole well, with minimal side effects. Today, a comprehensive survivorship care plan and treatment summary was reviewed with the patient today detailing her breast cancer diagnosis, treatment course, potential late/long-term effects of treatment, appropriate follow-up care with recommendations for the future, and patient education resources.  A copy of this  summary, along with a letter will be sent to the patient's primary care provider via mail/fax/In Basket message after today's visit.    2. Blood pressure: We did get a reading at one point today of a low blood pressure. I checked her BP manually and got 128/70, it was checked multiple other times and was 140/70.  I think the low one must be the outlier, she has no abnormal heart rhythm.  I did check some labs just to be sure, and they were normal as well.      3. Bone health:  Given Marie Alvarez's age/history of breast cancer and her current treatment regimen including anti-estrogen therapy with Letrozole, she is at risk for bone demineralization. She tells me that she is about to undergo bone density testing with Dr. Joylene Draft.  I requested that she bring those results to her next appointment with Dr. Lindi Adie for everyone to review.  She was given education on specific activities to promote bone health.  4. Cancer screening:  Due to Ms. Kiang's history and her age, she should receive screening for skin cancers, colon cancer, and gynecologic cancers.  The information and recommendations are listed on the patient's comprehensive care plan/treatment summary and were reviewed in detail with the patient.    5. Health maintenance and wellness promotion: Marie Alvarez was encouraged to consume 5-7 servings of fruits and vegetables per day. We reviewed the "Nutrition Rainbow" handout, as well as the handout "Take Control of Your Health and Reduce Your Cancer Risk" from the [place].  She was also encouraged to engage in moderate to vigorous exercise for 30 minutes per day most days of the week. We discussed the [employer] fitness program, which is designed for cancer survivors to help them become more physically fit after cancer treatments.  She was instructed to limit her alcohol consumption and continue to abstain from tobacco use.     6. Support services/counseling: It is not uncommon for this period of  the patient's cancer care trajectory to be one of many emotions and stressors.  We discussed an opportunity for her to participate in the next session of [facility] ("Finding Your New Normal") support group series designed for patients after they have completed treatment.   Marie Alvarez was encouraged to take advantage of our many other support services programs, support groups, and/or counseling in coping with her new life as a cancer survivor after completing anti-cancer treatment.  She was given information regarding our available services and encouraged to contact me with any questions or for help enrolling in any of our support group/programs.    Dispo:   -Return to cancer center in 6 months for f/u with Dr. Lindi Adie  -Mammogram due in 10/2018 -Follow up with surgery this month -She is welcome to return back to the Survivorship Clinic at any time; no  additional follow-up needed at this time.  -Consider referral back to survivorship as a long-term survivor for continued surveillance  A total of (30) minutes of face-to-face time was spent with this patient with greater than 50% of that time in counseling and care-coordination.   Gardenia Phlegm, NP Survivorship Program [facility] [phone]   Note: PRIMARY CARE PROVIDER Crist Infante, [place] [phone]

## 2018-06-25 NOTE — Telephone Encounter (Signed)
-----   Message from Gardenia Phlegm, NP sent at 06/25/2018  1:34 PM EDT ----- Labs and urine look ok.  pleaes let patient know ----- Message ----- From: Interface, Lab In [place] Sent: 06/25/2018  12:07 PM To: Gardenia Phlegm, NP

## 2018-06-26 LAB — URINE CULTURE: CULTURE: NO GROWTH

## 2018-06-29 DIAGNOSIS — Z803 Family history of malignant neoplasm of breast: Secondary | ICD-10-CM | POA: Diagnosis not present

## 2018-06-29 DIAGNOSIS — Z853 Personal history of malignant neoplasm of breast: Secondary | ICD-10-CM | POA: Diagnosis not present

## 2018-06-29 DIAGNOSIS — C50111 Malignant neoplasm of central portion of right female breast: Secondary | ICD-10-CM | POA: Diagnosis not present

## 2018-07-04 DIAGNOSIS — Z961 Presence of intraocular lens: Secondary | ICD-10-CM | POA: Diagnosis not present

## 2018-07-04 DIAGNOSIS — H10413 Chronic giant papillary conjunctivitis, bilateral: Secondary | ICD-10-CM | POA: Diagnosis not present

## 2018-07-04 DIAGNOSIS — H04123 Dry eye syndrome of bilateral lacrimal glands: Secondary | ICD-10-CM | POA: Diagnosis not present

## 2018-07-04 DIAGNOSIS — H11823 Conjunctivochalasis, bilateral: Secondary | ICD-10-CM | POA: Diagnosis not present

## 2018-07-04 DIAGNOSIS — H04523 Eversion of bilateral lacrimal punctum: Secondary | ICD-10-CM | POA: Diagnosis not present

## 2018-07-12 DIAGNOSIS — Z6825 Body mass index (BMI) 25.0-25.9, adult: Secondary | ICD-10-CM | POA: Diagnosis not present

## 2018-07-12 DIAGNOSIS — R531 Weakness: Secondary | ICD-10-CM | POA: Diagnosis not present

## 2018-07-12 DIAGNOSIS — I10 Essential (primary) hypertension: Secondary | ICD-10-CM | POA: Diagnosis not present

## 2018-07-17 ENCOUNTER — Telehealth: Payer: Self-pay

## 2018-07-17 NOTE — Telephone Encounter (Signed)
Returned patient's call.  Patient c/o severe body aches and joint pain.  Patient stated, "I just can't stand this, I know this is happening because of that medicine."  Patient denies any fever, or chills.  Patient on antiestrogen therapy, Letrozole 2.5mg  tablet daily.  Per Dr. Geralyn Flash recommendations:   Hold Letrozole 2.5mg  for 2 weeks and call office to update on symptoms to see about changing medication if symptoms subside.    Patient voiced understanding and has no further needs at this time.  Patient aware to call around 07/31/2018.

## 2018-08-01 ENCOUNTER — Other Ambulatory Visit: Payer: Self-pay

## 2018-08-01 MED ORDER — ANASTROZOLE 1 MG PO TABS: 1.0000 mg | ORAL_TABLET | Freq: Every day | ORAL | 0 refills | Status: DC

## 2018-08-06 DIAGNOSIS — I10 Essential (primary) hypertension: Secondary | ICD-10-CM | POA: Diagnosis not present

## 2018-08-06 DIAGNOSIS — R300 Dysuria: Secondary | ICD-10-CM | POA: Diagnosis not present

## 2018-08-06 DIAGNOSIS — N390 Urinary tract infection, site not specified: Secondary | ICD-10-CM | POA: Diagnosis not present

## 2018-08-06 DIAGNOSIS — R69 Illness, unspecified: Secondary | ICD-10-CM | POA: Diagnosis not present

## 2018-08-06 DIAGNOSIS — R3129 Other microscopic hematuria: Secondary | ICD-10-CM | POA: Diagnosis not present

## 2018-08-06 DIAGNOSIS — R7301 Impaired fasting glucose: Secondary | ICD-10-CM | POA: Diagnosis not present

## 2018-08-06 DIAGNOSIS — C50919 Malignant neoplasm of unspecified site of unspecified female breast: Secondary | ICD-10-CM | POA: Diagnosis not present

## 2018-08-06 DIAGNOSIS — Z6825 Body mass index (BMI) 25.0-25.9, adult: Secondary | ICD-10-CM | POA: Diagnosis not present

## 2018-08-06 DIAGNOSIS — Z23 Encounter for immunization: Secondary | ICD-10-CM | POA: Diagnosis not present

## 2018-08-27 ENCOUNTER — Other Ambulatory Visit: Payer: Self-pay | Admitting: Hematology and Oncology

## 2018-09-12 ENCOUNTER — Telehealth: Payer: Self-pay

## 2018-09-12 NOTE — Telephone Encounter (Signed)
Returned patient's call regarding medication.  Patient started taking Anastrozole 1/2 tablet on 09/03/2018.  Patient experiencing stiffness, dizziness, and nausea once starting medication.  Nurse advised patient to hold medication X 2 weeks d/t not being able to tolerate.  Patient will call office back in 2 weeks to update on symptoms.    No further needs at this time.

## 2018-09-20 ENCOUNTER — Other Ambulatory Visit: Payer: Self-pay | Admitting: Hematology and Oncology

## 2018-09-27 ENCOUNTER — Telehealth: Payer: Self-pay

## 2018-09-27 NOTE — Telephone Encounter (Signed)
Pt called to update our office regarding being off of anti estrogen for 2 weeks. Pt is feeling so much better and her dizziness and aches/pain have subsided greatly. Pt would like to know what she will need to do next. Pt was on anastrozole and tamoxifen in the past, but states that she did not tolerate as well. Will schedule pt for an appt with Dr.Gudena and discuss another option for pt. Confirmed time/date with pt for next week appt.

## 2018-10-01 ENCOUNTER — Inpatient Hospital Stay: Payer: Medicare HMO | Attending: Hematology and Oncology | Admitting: Hematology and Oncology

## 2018-10-01 DIAGNOSIS — Z170 Estrogen receptor positive status [ER+]: Secondary | ICD-10-CM | POA: Diagnosis not present

## 2018-10-01 DIAGNOSIS — Z79899 Other long term (current) drug therapy: Secondary | ICD-10-CM | POA: Diagnosis not present

## 2018-10-01 DIAGNOSIS — Z853 Personal history of malignant neoplasm of breast: Secondary | ICD-10-CM | POA: Diagnosis not present

## 2018-10-01 DIAGNOSIS — C50211 Malignant neoplasm of upper-inner quadrant of right female breast: Secondary | ICD-10-CM | POA: Diagnosis not present

## 2018-10-01 DIAGNOSIS — C50412 Malignant neoplasm of upper-outer quadrant of left female breast: Secondary | ICD-10-CM

## 2018-10-01 MED ORDER — TAMOXIFEN CITRATE 20 MG PO TABS: 20.0000 mg | ORAL_TABLET | Freq: Every day | ORAL | 3 refills | Status: DC

## 2018-10-01 NOTE — Assessment & Plan Note (Signed)
01/12/18:Rt Lumpectomy: IDC grade 1 measuring 0.9 cm with ext DCIS, Margins Neg, ER 100%, PR 100%, Ki-67 2%, HER-2 negative ratio 1.19, T1bNx Stage 1A Adjuvant radiation therapy: 03/01/2018-03/26/2018  Treatment plan: Adjuvant antiestrogen therapy with letrozole 2.5 mg daily to start 04/11/2018  Letrozole toxicities:  Breast cancer surveillance: 1.  Breast exam July 2019: Benign 2. Mammogram scheduled for 11/13/2018  Return to clinic in 6 months for follow-up and after that we can see her in one year

## 2018-10-01 NOTE — Progress Notes (Signed)
Patient Care Team: Crist Infante, MD as PCP - General (Internal Medicine) Nicholas Lose, MD as Consulting Physician (Hematology and Oncology) Delice Bison Charlestine Massed, NP as Nurse Practitioner (Hematology and Oncology) Eppie Gibson, MD as Attending Physician (Radiation Oncology) Fanny Skates, MD as Consulting Physician (General Surgery)  DIAGNOSIS:  Encounter Diagnosis  Name Primary?  . Malignant neoplasm of upper-outer quadrant of left breast in female, estrogen receptor positive ([place])     SUMMARY OF ONCOLOGIC HISTORY:   Malignant neoplasm of upper-outer quadrant of left breast in female, estrogen receptor positive ([place])   07/01/2002 Surgery    Left breast lumpectomy, stage 2 invasive ductal carcinoma with DCIS ER positive PR positive HER-2 negative, 3.4 cm tumor    07/01/2002 Pathologic Stage    Stage IIA: T2 N0    08/06/2002 - 11/06/2002 Chemotherapy    Adjuvant chemotherapy with CMF 6    12/06/2002 - 12/31/2002 Radiation Therapy    Adjuvant radiation therapy    01/29/2003 - 01/06/2009 Anti-estrogen oral therapy    Tamoxifen for 1 year then Arimidex 1 mg daily     Malignant neoplasm of upper-inner quadrant of right breast in female, estrogen receptor positive ([place])   2003 Miscellaneous    Left breast lumpectomy, stage 2 invasive ductal carcinoma with DCIS ER positive PR positive HER-2 negative, 3.4 cm tumor, CMF x6 followed by radiation followed by tamoxifen and later anastrozole completed 2010     11/29/2017 Initial Diagnosis    Right breast biopsy 4 o'clock position: Grade 1-2 IDC with DCIS with necrosis, ER 100%, PR 100%, Ki-67 2%, HER-2 negative ratio 1.19; 7 o'clock position fibroadenoma; ultrasound detected 3 o'clock position 2 x 1.5 x 1.7 cm, T1CN0 stage I a clinical stage    01/12/2018 Surgery    Rt Lumpectomy: IDC grade 1 measuring 0.9 cm with ext DCIS, Margins Neg, ER 100%, PR 100%, Ki-67 2%, HER-2 negative ratio 1.19, T1bNx Stage 1A      03/01/2018 -  03/26/2018 Radiation Therapy    Adjuvant radiation therapy    04/2018 -  Anti-estrogen oral therapy    Could not tolerate anastrozole or letrozole.  Prescribed tamoxifen     CHIEF COMPLIANT: Patient could not tolerate anastrozole and letrozole  INTERVAL HISTORY: Marie Alvarez is a 84 year old with a contralateral breast cancer who was started on anastrozole and could not tolerate it answered to switch doctors letrozole and she could not tolerate that either.  After stopping the therapy she has felt remarkably better.  She is here today to discuss what other options she has left.  Previously she had tamoxifen and apparently tolerated it extremely well.  REVIEW OF SYSTEMS:   Constitutional: Denies fevers, chills or abnormal weight loss Eyes: Denies blurriness of vision Ears, nose, mouth, throat, and face: Denies mucositis or sore throat Respiratory: Denies cough, dyspnea or wheezes Cardiovascular: Denies palpitation, chest discomfort Gastrointestinal:  Denies nausea, heartburn or change in bowel habits Skin: Denies abnormal skin rashes Lymphatics: Denies new lymphadenopathy or easy bruising Neurological:Denies numbness, tingling or new weaknesses Behavioral/Psych: Mood is stable, no new changes  Extremities: No lower extremity edema Breast:  denies any pain or lumps or nodules in either breasts All other systems were reviewed with the patient and are negative.  I have reviewed the past medical history, past surgical history, social history and family history with the patient and they are unchanged from previous note.  ALLERGIES:  is allergic to prednisone and sulfamethoxazole.  MEDICATIONS:  Current Outpatient Medications  Medication Sig  Dispense Refill  . amLODipine (NORVASC) 5 MG tablet Take 5 mg by mouth daily.     . Carboxymethylcellul-Glycerin (LUBRICATING EYE DROPS OP) Place 1 drop into both eyes daily as needed (dry eyes).    . cholecalciferol (VITAMIN D) 1000 units tablet Take  1,000 Units by mouth daily.    Marland Kitchen loratadine (CLARITIN) 10 MG tablet Take 10 mg by mouth daily.    Marland Kitchen losartan (COZAAR) 100 MG tablet Take 100 mg by mouth daily.    . Multiple Vitamins-Minerals (PRESERVISION AREDS 2 PO) Take 1 capsule by mouth 2 (two) times daily.    . nortriptyline (PAMELOR) 10 MG capsule Take 10 mg by mouth at bedtime.    . pantoprazole (PROTONIX) 40 MG tablet Take 40 mg by mouth daily. May take a second 40 mg dose as needed for acid reflux    . POTASSIUM PO Take by mouth.    . pravastatin (PRAVACHOL) 40 MG tablet Take 40 mg by mouth daily.    . tamoxifen (NOLVADEX) 20 MG tablet Take 1 tablet (20 mg total) by mouth daily. 90 tablet 3  . vitamin B-12 (CYANOCOBALAMIN) 1000 MCG tablet Take 1,000 mcg by mouth daily.     No current facility-administered medications for this visit.     PHYSICAL EXAMINATION: ECOG PERFORMANCE STATUS: 0 - Asymptomatic  Vitals:   10/01/18 1142  BP: (!) 151/77  Pulse: 91  Resp: 17  Temp: 97.8 F (36.6 C)  SpO2: 100%   Filed Weights   10/01/18 1142  Weight: 137 lb 8 oz (62.4 kg)    GENERAL:alert, no distress and comfortable SKIN: skin color, texture, turgor are normal, no rashes or significant lesions EYES: normal, Conjunctiva are pink and non-injected, sclera clear OROPHARYNX:no exudate, no erythema and lips, buccal mucosa, and tongue normal  NECK: supple, thyroid normal size, non-tender, without nodularity LYMPH:  no palpable lymphadenopathy in the cervical, axillary or inguinal LUNGS: clear to auscultation and percussion with normal breathing effort HEART: regular rate & rhythm and no murmurs and no lower extremity edema ABDOMEN:abdomen soft, non-tender and normal bowel sounds MUSCULOSKELETAL:no cyanosis of digits and no clubbing  NEURO: alert & oriented x 3 with fluent speech, no focal motor/sensory deficits EXTREMITIES: No lower extremity edema BREAST: No palpable masses or nodules in either right or left breasts. No palpable  axillary supraclavicular or infraclavicular adenopathy no breast tenderness or nipple discharge. (exam performed in the presence of a chaperone)  LABORATORY DATA:  I have reviewed the data as listed CMP Latest Ref Rng & Units 06/25/2018 01/08/2018 02/15/2017  Glucose 70 - 99 mg/dL 83 102(H) 93  BUN 8 - 23 mg/dL 17 16 12.5  Creatinine 0.44 - 1.00 mg/dL 0.78 0.76 0.8  Sodium 135 - 145 mmol/L 139 137 140  Potassium 3.5 - 5.1 mmol/L 4.0 4.5 4.1  Chloride 98 - 111 mmol/L 105 103 -  CO2 22 - 32 mmol/L 26 25 29   Calcium 8.9 - 10.3 mg/dL 10.0 9.7 10.5(H)  Total Protein 6.5 - 8.1 g/dL 7.4 - 7.2  Total Bilirubin 0.3 - 1.2 mg/dL 0.5 - 0.70  Alkaline Phos 38 - 126 U/L 72 - 75  AST 15 - 41 U/L 19 - 16  ALT 0 - 44 U/L 17 - 11    Lab Results  Component Value Date   WBC 5.7 06/25/2018   HGB 14.4 06/25/2018   HCT 43.8 06/25/2018   MCV 91.6 06/25/2018   PLT 162 06/25/2018   NEUTROABS 3.7 06/25/2018  ASSESSMENT & PLAN:  Malignant neoplasm of upper-outer quadrant of left breast in female, estrogen receptor positive ([place]) 01/12/18:Rt Lumpectomy: IDC grade 1 measuring 0.9 cm with ext DCIS, Margins Neg, ER 100%, PR 100%, Ki-67 2%, HER-2 negative ratio 1.19, T1bNx Stage 1A Adjuvant radiation therapy: 03/01/2018-03/26/2018  Treatment plan: Adjuvant antiestrogen therapy with letrozole 2.5 mg daily started 04/11/2018, switched to anastrozole but she could not tolerate that either. Patient had profound of fatigue and muscle aches and pains and feeling of being ill. Once she stopped the medication her symptoms resolved. We discussed about different options and she wishes to take some antiestrogen therapy. Because this I prescribed her tamoxifen.  She will start at 10 mg daily for 2 weeks and then if she tolerates it well she will increase to 20 mg daily.  Breast cancer surveillance: 1.  Breast exam July 2019: Benign 2. Mammogram scheduled for 11/13/2018  Return to clinic in 6 months for follow-up and after  that we can see her in one year    No orders of the defined types were placed in this encounter.  The patient has a good understanding of the overall plan. she agrees with it. she will call with any problems that may develop before the next visit here.   Harriette Ohara, MD 10/01/18

## 2018-11-01 DIAGNOSIS — L309 Dermatitis, unspecified: Secondary | ICD-10-CM | POA: Diagnosis not present

## 2018-11-01 DIAGNOSIS — L821 Other seborrheic keratosis: Secondary | ICD-10-CM | POA: Diagnosis not present

## 2018-11-13 ENCOUNTER — Ambulatory Visit
Admission: RE | Admit: 2018-11-13 | Discharge: 2018-11-13 | Disposition: A | Discharge status: Home / Self Care | Payer: Medicare HMO | Source: Ambulatory Visit | Attending: Adult Health | Admitting: Adult Health

## 2018-11-13 DIAGNOSIS — C50912 Malignant neoplasm of unspecified site of left female breast: Secondary | ICD-10-CM | POA: Diagnosis not present

## 2018-11-13 DIAGNOSIS — R922 Inconclusive mammogram: Secondary | ICD-10-CM | POA: Diagnosis not present

## 2018-11-13 DIAGNOSIS — C50911 Malignant neoplasm of unspecified site of right female breast: Secondary | ICD-10-CM | POA: Diagnosis not present

## 2018-11-13 DIAGNOSIS — Z170 Estrogen receptor positive status [ER+]: Secondary | ICD-10-CM

## 2018-11-13 DIAGNOSIS — C50211 Malignant neoplasm of upper-inner quadrant of right female breast: Secondary | ICD-10-CM

## 2018-11-13 HISTORY — DX: Personal history of irradiation: Z92.3

## 2018-11-13 HISTORY — DX: Personal history of antineoplastic chemotherapy: Z92.21

## 2018-11-14 DIAGNOSIS — Z6825 Body mass index (BMI) 25.0-25.9, adult: Secondary | ICD-10-CM | POA: Diagnosis not present

## 2018-11-14 DIAGNOSIS — I10 Essential (primary) hypertension: Secondary | ICD-10-CM | POA: Diagnosis not present

## 2018-11-14 DIAGNOSIS — N390 Urinary tract infection, site not specified: Secondary | ICD-10-CM | POA: Diagnosis not present

## 2018-11-14 DIAGNOSIS — C50919 Malignant neoplasm of unspecified site of unspecified female breast: Secondary | ICD-10-CM | POA: Diagnosis not present

## 2018-11-14 DIAGNOSIS — R69 Illness, unspecified: Secondary | ICD-10-CM | POA: Diagnosis not present

## 2018-12-03 DIAGNOSIS — H10413 Chronic giant papillary conjunctivitis, bilateral: Secondary | ICD-10-CM | POA: Diagnosis not present

## 2018-12-03 DIAGNOSIS — H01022 Squamous blepharitis right lower eyelid: Secondary | ICD-10-CM | POA: Diagnosis not present

## 2018-12-03 DIAGNOSIS — H11823 Conjunctivochalasis, bilateral: Secondary | ICD-10-CM | POA: Diagnosis not present

## 2018-12-03 DIAGNOSIS — H01025 Squamous blepharitis left lower eyelid: Secondary | ICD-10-CM | POA: Diagnosis not present

## 2018-12-03 DIAGNOSIS — H01021 Squamous blepharitis right upper eyelid: Secondary | ICD-10-CM | POA: Diagnosis not present

## 2018-12-03 DIAGNOSIS — H02135 Senile ectropion of left lower eyelid: Secondary | ICD-10-CM | POA: Diagnosis not present

## 2018-12-03 DIAGNOSIS — H01024 Squamous blepharitis left upper eyelid: Secondary | ICD-10-CM | POA: Diagnosis not present

## 2018-12-03 DIAGNOSIS — H04523 Eversion of bilateral lacrimal punctum: Secondary | ICD-10-CM | POA: Diagnosis not present

## 2018-12-03 DIAGNOSIS — H04123 Dry eye syndrome of bilateral lacrimal glands: Secondary | ICD-10-CM | POA: Diagnosis not present

## 2018-12-25 NOTE — Assessment & Plan Note (Deleted)
01/12/18:Rt Lumpectomy: IDC grade 1 measuring 0.9 cm with ext DCIS, Margins Neg, ER 100%, PR 100%, Ki-67 2%, HER-2 negative ratio 1.19, T1bNx Stage 1A Adjuvant radiation therapy: 03/01/2018-03/26/2018  Treatment plan: Adjuvant antiestrogen therapy with letrozole 2.5 mg daily started 04/11/2018, switched to anastrozole but she could not tolerate that either.  Switched to tamoxifen  Current Treatment: Tamoxifen. 20 mg daily. Patient is tolerating tamoxifen extremely well without any major problems.  Breast cancer surveillance: 1.  Breast exam 12/25/18: Benign 2. Mammogram  11/13/2018: Benign

## 2018-12-25 NOTE — Progress Notes (Signed)
Patient Care Team: Crist Infante, MD as PCP - General (Internal Medicine) Nicholas Lose, MD as Consulting Physician (Hematology and Oncology) Delice Bison, Charlestine Massed, NP as Nurse Practitioner (Hematology and Oncology) Eppie Gibson, MD as Attending Physician (Radiation Oncology) Fanny Skates, MD as Consulting Physician (General Surgery)  DIAGNOSIS:    ICD-10-CM   1. Malignant neoplasm of upper-outer quadrant of left breast in female, estrogen receptor positive ([place]) C50.412    Z17.0   2. Malignant neoplasm of upper-inner quadrant of right breast in female, estrogen receptor positive ([place]) C50.211    Z17.0     SUMMARY OF ONCOLOGIC HISTORY:   Malignant neoplasm of upper-outer quadrant of left breast in female, estrogen receptor positive ([place])   07/01/2002 Surgery    Left breast lumpectomy, stage 2 invasive ductal carcinoma with DCIS ER positive PR positive HER-2 negative, 3.4 cm tumor    07/01/2002 Pathologic Stage    Stage IIA: T2 N0    08/06/2002 - 11/06/2002 Chemotherapy    Adjuvant chemotherapy with CMF 6    12/06/2002 - 12/31/2002 Radiation Therapy    Adjuvant radiation therapy    01/29/2003 - 01/06/2009 Anti-estrogen oral therapy    Tamoxifen for 1 year then Arimidex 1 mg daily     Malignant neoplasm of upper-inner quadrant of right breast in female, estrogen receptor positive ([place])   2003 Miscellaneous    Left breast lumpectomy, stage 2 invasive ductal carcinoma with DCIS ER positive PR positive HER-2 negative, 3.4 cm tumor, CMF x6 followed by radiation followed by tamoxifen and later anastrozole completed 2010     11/29/2017 Initial Diagnosis    Right breast biopsy 4 o'clock position: Grade 1-2 IDC with DCIS with necrosis, ER 100%, PR 100%, Ki-67 2%, HER-2 negative ratio 1.19; 7 o'clock position fibroadenoma; ultrasound detected 3 o'clock position 2 x 1.5 x 1.7 cm, T1CN0 stage I a clinical stage    01/12/2018 Surgery    Rt Lumpectomy: IDC grade 1 measuring 0.9 cm with  ext DCIS, Margins Neg, ER 100%, PR 100%, Ki-67 2%, HER-2 negative ratio 1.19, T1bNx Stage 1A      03/01/2018 - 03/26/2018 Radiation Therapy    Adjuvant radiation therapy    04/2018 -  Anti-estrogen oral therapy    Could not tolerate anastrozole or letrozole. Tolerating tamoxifen     CHIEF COMPLIANT: Follow-up of Tamoxifen therapy  INTERVAL HISTORY: Marie Alvarez is a 85 y.o. with above-mentioned history of contralateral breast cancer who was unable to tolerate letrozole or anastrozole and is currently on Tamoxifen therapy. Her most recent mammogram on 11/13/18 showed no evidence of malignancy. She presents to the clinic alone today and is tolerating with Tamoxifen very well. She reports occasional, sharp jabs of pain close to the nipple. She is trying to eat healthy. She reviewed her medication list with me.   REVIEW OF SYSTEMS:   Constitutional: Denies fevers, chills or abnormal weight loss Eyes: Denies blurriness of vision Ears, nose, mouth, throat, and face: Denies mucositis or sore throat Respiratory: Denies cough, dyspnea or wheezes Cardiovascular: Denies palpitation, chest discomfort Gastrointestinal:  Denies nausea, heartburn or change in bowel habits Skin: Denies abnormal skin rashes Lymphatics: Denies new lymphadenopathy or easy bruising Neurological: Denies numbness, tingling or new weaknesses Behavioral/Psych: Mood is stable, no new changes  Extremities: No lower extremity edema Breast: denies any lumps or nodules in either breasts (+) occasional pain in breast  All other systems were reviewed with the patient and are negative.  I have reviewed the past medical  history, past surgical history, social history and family history with the patient and they are unchanged from previous note.  ALLERGIES:  is allergic to prednisone and sulfamethoxazole.  MEDICATIONS:  Current Outpatient Medications  Medication Sig Dispense Refill  . amLODipine (NORVASC) 5 MG tablet Take 5 mg by  mouth daily.     . cholecalciferol (VITAMIN D) 1000 units tablet Take 1,000 Units by mouth daily.    Marland Kitchen doxazosin (CARDURA) 4 MG tablet Take 1 tablet (4 mg total) by mouth daily.    . Multiple Vitamins-Minerals (PRESERVISION AREDS 2 PO) Take 1 capsule by mouth 2 (two) times daily.    . nortriptyline (PAMELOR) 10 MG capsule Take 10 mg by mouth at bedtime.    . pantoprazole (PROTONIX) 40 MG tablet Take 40 mg by mouth daily. May take a second 40 mg dose as needed for acid reflux    . pravastatin (PRAVACHOL) 40 MG tablet Take 40 mg by mouth daily.    . tamoxifen (NOLVADEX) 20 MG tablet Take 1 tablet (20 mg total) by mouth daily. 90 tablet 3  . vitamin B-12 (CYANOCOBALAMIN) 1000 MCG tablet Take 1,000 mcg by mouth daily.     No current facility-administered medications for this visit.     PHYSICAL EXAMINATION: ECOG PERFORMANCE STATUS: 1 - Symptomatic but completely ambulatory  Vitals:   12/26/18 1322  BP: (!) 144/61  Pulse: (!) 103  Resp: 18  Temp: 97.8 F (36.6 C)  SpO2: 100%   Filed Weights   12/26/18 1322  Weight: 142 lb 14.4 oz (64.8 kg)    GENERAL: alert, no distress and comfortable SKIN: skin color, texture, turgor are normal, no rashes or significant lesions EYES: normal, Conjunctiva are pink and non-injected, sclera clear OROPHARYNX: no exudate, no erythema and lips, buccal mucosa, and tongue normal  NECK: supple, thyroid normal size, non-tender, without nodularity LYMPH: no palpable lymphadenopathy in the cervical, axillary or inguinal LUNGS: clear to auscultation and percussion with normal breathing effort HEART: regular rate & rhythm and no murmurs and no lower extremity edema ABDOMEN: abdomen soft, non-tender and normal bowel sounds MUSCULOSKELETAL: no cyanosis of digits and no clubbing  NEURO: alert & oriented x 3 with fluent speech, no focal motor/sensory deficits EXTREMITIES: No lower extremity edema BREAST: Scar tissue in the right breast related to prior surgery.   Left breast no palpable lumps or nodules.  No palpable axillary supraclavicular or infraclavicular adenopathy no breast tenderness or nipple discharge. (exam performed in the presence of a chaperone)  LABORATORY DATA:  I have reviewed the data as listed CMP Latest Ref Rng & Units 06/25/2018 01/08/2018 02/15/2017  Glucose 70 - 99 mg/dL 83 102(H) 93  BUN 8 - 23 mg/dL 17 16 12.5  Creatinine 0.44 - 1.00 mg/dL 0.78 0.76 0.8  Sodium 135 - 145 mmol/L 139 137 140  Potassium 3.5 - 5.1 mmol/L 4.0 4.5 4.1  Chloride 98 - 111 mmol/L 105 103 -  CO2 22 - 32 mmol/L 26 25 29   Calcium 8.9 - 10.3 mg/dL 10.0 9.7 10.5(H)  Total Protein 6.5 - 8.1 g/dL 7.4 - 7.2  Total Bilirubin 0.3 - 1.2 mg/dL 0.5 - 0.70  Alkaline Phos 38 - 126 U/L 72 - 75  AST 15 - 41 U/L 19 - 16  ALT 0 - 44 U/L 17 - 11    Lab Results  Component Value Date   WBC 5.7 06/25/2018   HGB 14.4 06/25/2018   HCT 43.8 06/25/2018   MCV 91.6 06/25/2018  PLT 162 06/25/2018   NEUTROABS 3.7 06/25/2018    ASSESSMENT & PLAN:  Malignant neoplasm of upper-inner quadrant of right breast in female, estrogen receptor positive ([place]) 01/12/18:Rt Lumpectomy: IDC grade 1 measuring 0.9 cm with ext DCIS, Margins Neg, ER 100%, PR 100%, Ki-67 2%, HER-2 negative ratio 1.19, T1bNx Stage 1A Adjuvant radiation therapy: 03/01/2018-03/26/2018  Treatment plan: Adjuvant antiestrogen therapy with letrozole 2.5 mg daily started 04/11/2018, switched to anastrozole but she could not tolerate that either.  Switched to tamoxifen  Current Treatment: Tamoxifen. 20 mg daily. Patient is tolerating tamoxifen extremely well without any major problems.  Breast cancer surveillance: 1.  Breast exam 12/25/18: Benign 2. Mammogram  11/13/2018: Benign    No orders of the defined types were placed in this encounter.  The patient has a good understanding of the overall plan. she agrees with it. she will call with any problems that may develop before the next visit here.  Nicholas Lose,  MD 12/26/2018  Julious Oka Dorshimer am acting as scribe for Dr. Nicholas Lose.  I have reviewed the above documentation for accuracy and completeness, and I agree with the above.

## 2018-12-26 ENCOUNTER — Inpatient Hospital Stay: Payer: Medicare HMO | Attending: Hematology and Oncology | Admitting: Hematology and Oncology

## 2018-12-26 ENCOUNTER — Telehealth: Payer: Self-pay | Admitting: Hematology and Oncology

## 2018-12-26 DIAGNOSIS — N644 Mastodynia: Secondary | ICD-10-CM | POA: Insufficient documentation

## 2018-12-26 DIAGNOSIS — C50211 Malignant neoplasm of upper-inner quadrant of right female breast: Secondary | ICD-10-CM | POA: Insufficient documentation

## 2018-12-26 DIAGNOSIS — Z170 Estrogen receptor positive status [ER+]: Secondary | ICD-10-CM | POA: Diagnosis not present

## 2018-12-26 DIAGNOSIS — C50412 Malignant neoplasm of upper-outer quadrant of left female breast: Secondary | ICD-10-CM

## 2018-12-26 MED ORDER — DOXAZOSIN MESYLATE 4 MG PO TABS: 4.0000 mg | ORAL_TABLET | Freq: Every day | ORAL | Status: DC

## 2018-12-26 NOTE — Assessment & Plan Note (Signed)
01/12/18:Rt Lumpectomy: IDC grade 1 measuring 0.9 cm with ext DCIS, Margins Neg, ER 100%, PR 100%, Ki-67 2%, HER-2 negative ratio 1.19, T1bNx Stage 1A Adjuvant radiation therapy: 03/01/2018-03/26/2018  Treatment plan: Adjuvant antiestrogen therapy with letrozole 2.5 mg daily started 04/11/2018, switched to anastrozole but she could not tolerate that either.  Switched to tamoxifen  Current Treatment: Tamoxifen. 20 mg daily. Patient is tolerating tamoxifen extremely well without any major problems.  Breast cancer surveillance: 1.  Breast exam 12/25/18: Benign 2. Mammogram  11/13/2018: Benign 

## 2019-01-14 DIAGNOSIS — N952 Postmenopausal atrophic vaginitis: Secondary | ICD-10-CM | POA: Diagnosis not present

## 2019-01-14 DIAGNOSIS — N362 Urethral caruncle: Secondary | ICD-10-CM | POA: Diagnosis not present

## 2019-01-14 DIAGNOSIS — N368 Other specified disorders of urethra: Secondary | ICD-10-CM | POA: Diagnosis not present

## 2019-03-07 DIAGNOSIS — E7849 Other hyperlipidemia: Secondary | ICD-10-CM | POA: Diagnosis not present

## 2019-03-07 DIAGNOSIS — M859 Disorder of bone density and structure, unspecified: Secondary | ICD-10-CM | POA: Diagnosis not present

## 2019-03-07 DIAGNOSIS — R7301 Impaired fasting glucose: Secondary | ICD-10-CM | POA: Diagnosis not present

## 2019-03-07 DIAGNOSIS — D518 Other vitamin B12 deficiency anemias: Secondary | ICD-10-CM | POA: Diagnosis not present

## 2019-03-07 DIAGNOSIS — I10 Essential (primary) hypertension: Secondary | ICD-10-CM | POA: Diagnosis not present

## 2019-03-07 DIAGNOSIS — D519 Vitamin B12 deficiency anemia, unspecified: Secondary | ICD-10-CM | POA: Diagnosis not present

## 2019-03-13 DIAGNOSIS — Z0000 Encounter for general adult medical examination without abnormal findings: Secondary | ICD-10-CM | POA: Diagnosis not present

## 2019-03-13 DIAGNOSIS — J302 Other seasonal allergic rhinitis: Secondary | ICD-10-CM | POA: Diagnosis not present

## 2019-03-13 DIAGNOSIS — L309 Dermatitis, unspecified: Secondary | ICD-10-CM | POA: Diagnosis not present

## 2019-03-13 DIAGNOSIS — R531 Weakness: Secondary | ICD-10-CM | POA: Diagnosis not present

## 2019-03-13 DIAGNOSIS — R69 Illness, unspecified: Secondary | ICD-10-CM | POA: Diagnosis not present

## 2019-03-13 DIAGNOSIS — D692 Other nonthrombocytopenic purpura: Secondary | ICD-10-CM | POA: Diagnosis not present

## 2019-03-13 DIAGNOSIS — N390 Urinary tract infection, site not specified: Secondary | ICD-10-CM | POA: Diagnosis not present

## 2019-03-13 DIAGNOSIS — I8310 Varicose veins of unspecified lower extremity with inflammation: Secondary | ICD-10-CM | POA: Diagnosis not present

## 2019-03-13 DIAGNOSIS — M542 Cervicalgia: Secondary | ICD-10-CM | POA: Diagnosis not present

## 2019-03-13 DIAGNOSIS — C50919 Malignant neoplasm of unspecified site of unspecified female breast: Secondary | ICD-10-CM | POA: Diagnosis not present

## 2019-03-14 ENCOUNTER — Encounter: Payer: Medicare HMO | Admitting: Gynecology

## 2019-03-18 ENCOUNTER — Other Ambulatory Visit: Payer: Self-pay

## 2019-03-19 ENCOUNTER — Encounter: Payer: Medicare HMO | Admitting: Gynecology

## 2019-03-20 ENCOUNTER — Encounter: Payer: Medicare HMO | Admitting: Gynecology

## 2019-03-21 ENCOUNTER — Encounter: Payer: Medicare HMO | Admitting: Gynecology

## 2019-04-02 IMAGING — MG 2D DIGITAL SCREENING BILATERAL MAMMOGRAM WITH CAD AND ADJUNCT TO
9 of 12 series · 9 of 28 positions shown · non-contrast
Comparison: Previous exam(s).

CLINICAL DATA: Screening.

EXAM:
2D DIGITAL SCREENING BILATERAL MAMMOGRAM WITH CAD AND ADJUNCT TOMO

[L CC]
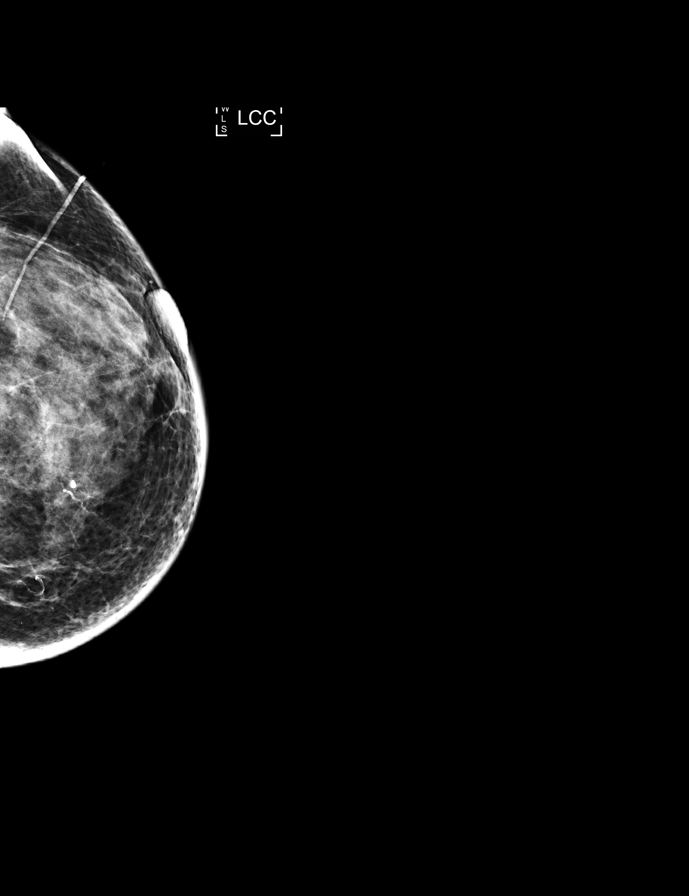

[L MLO]
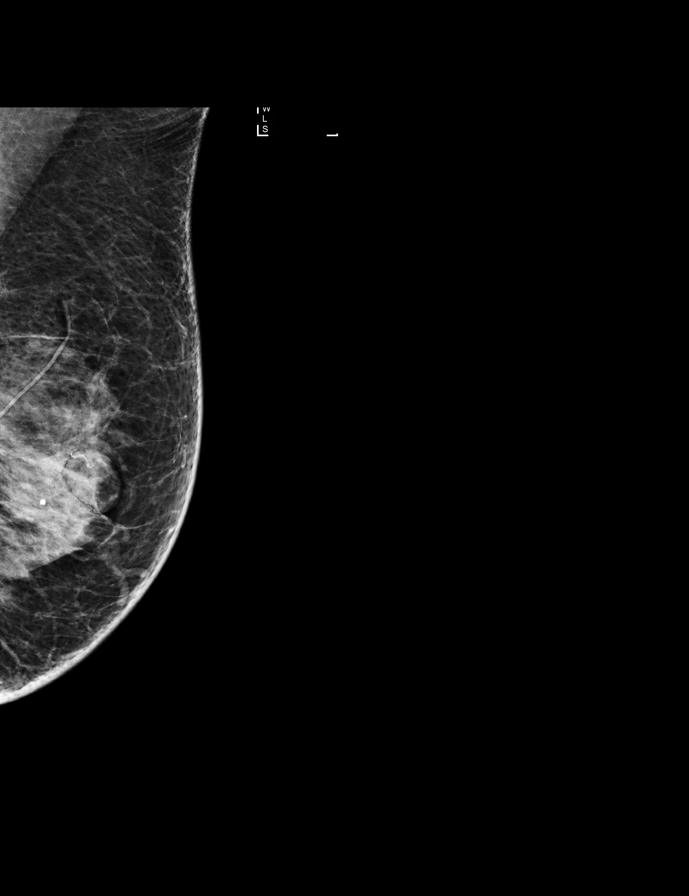

[L MLO synth-2D]
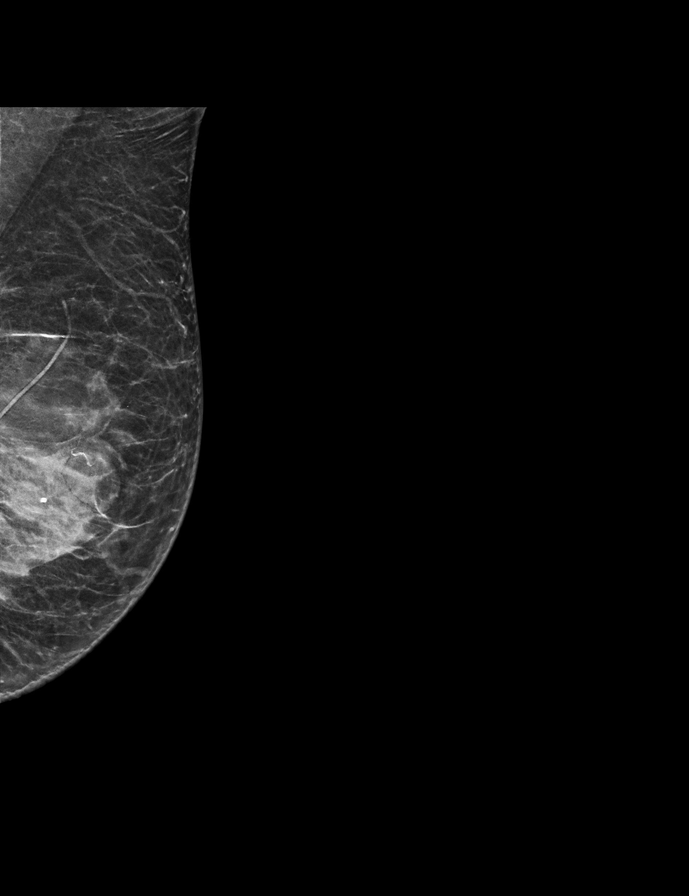

[L CC synth-2D]
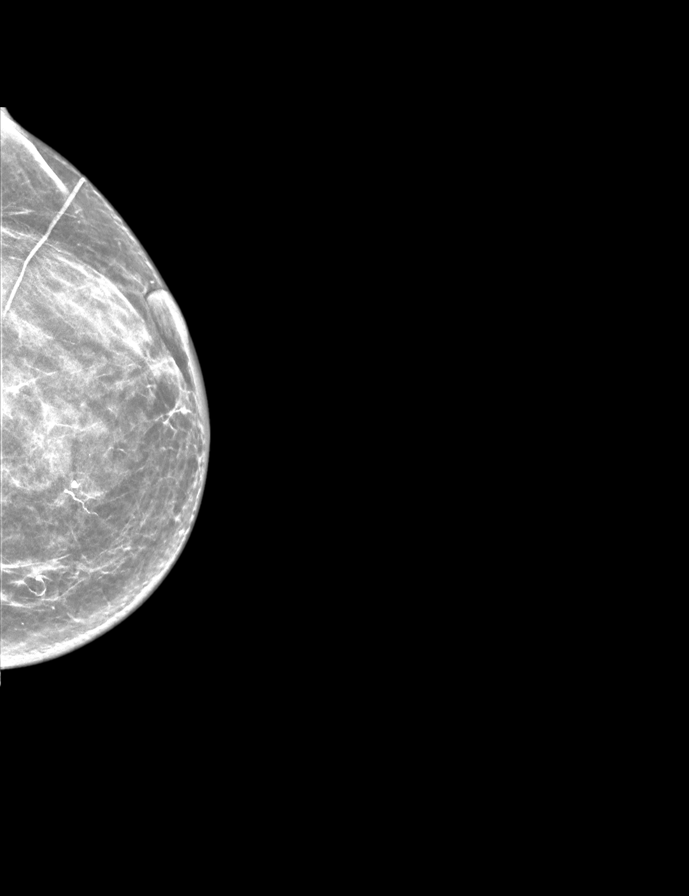

[R CC synth-2D]
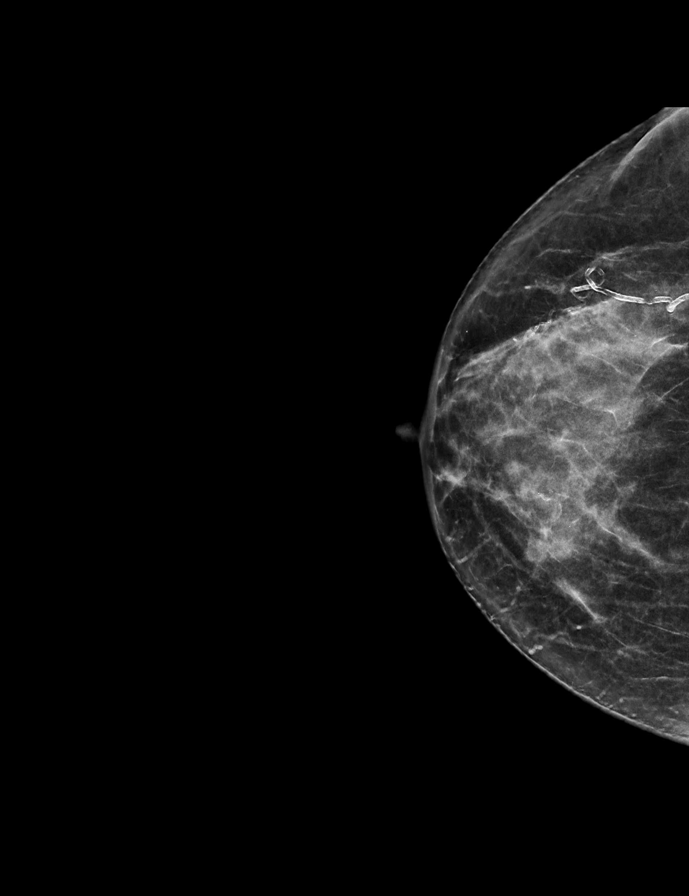

[R MLO synth-2D]
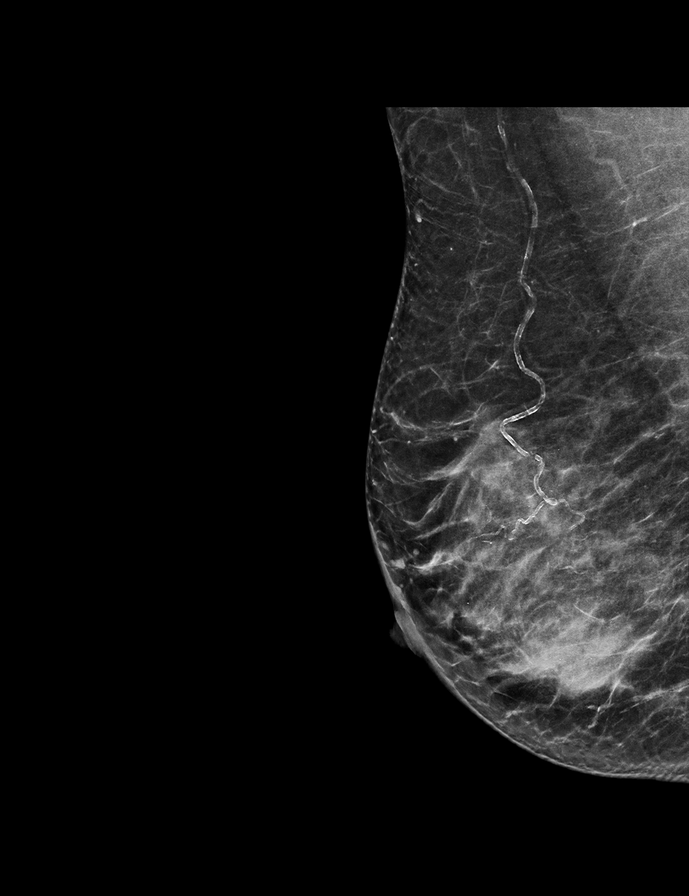

[R MLO]
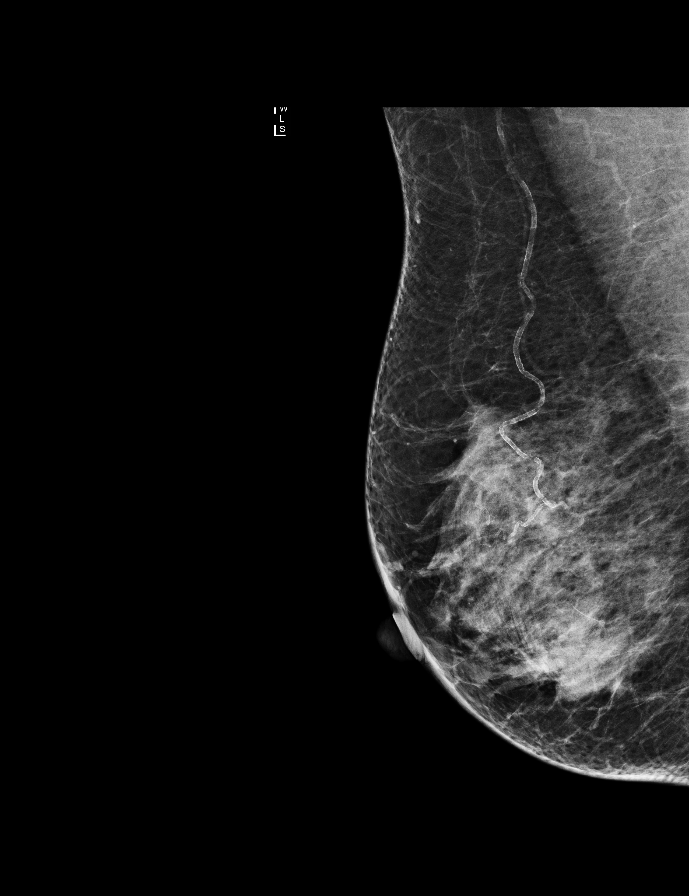

[R CC]
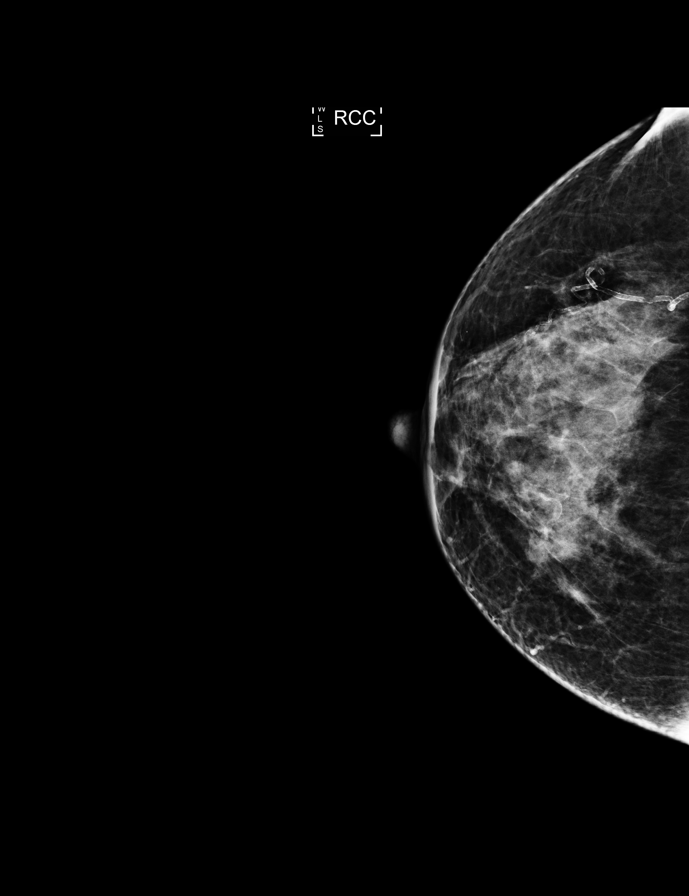

[R CC tomo · tomo slice 32/63.0]
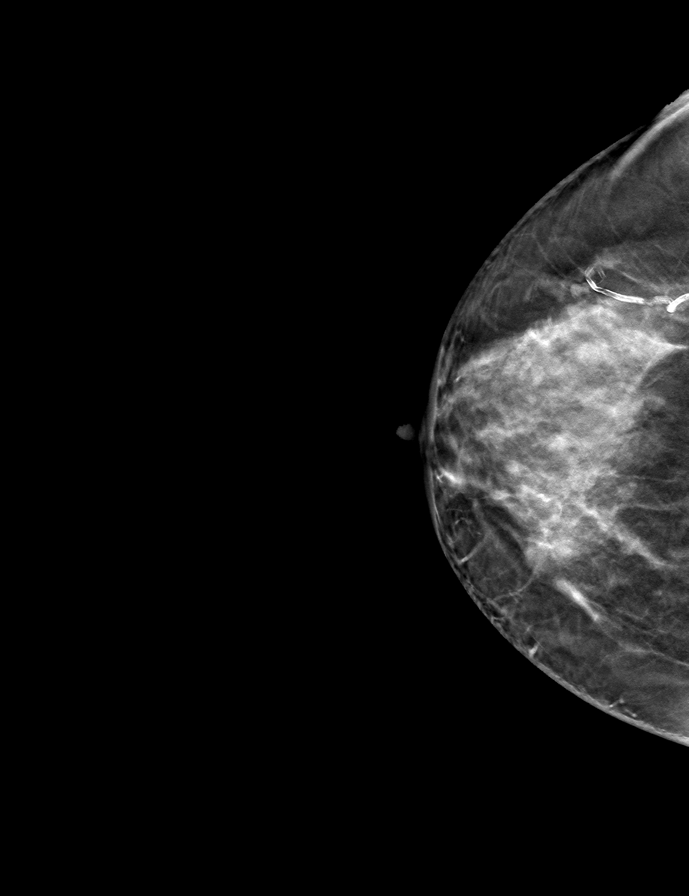

[9 of 28 positions shown; findings below may reference images not displayed]

ACR Breast Density Category c: The breast tissue is heterogeneously
dense, which may obscure small masses.
FINDINGS: In the right breast, a possible asymmetry warrants further
evaluation. In the left breast, no findings suspicious for
malignancy. Images were processed with CAD.
IMPRESSION: Further evaluation is suggested for possible asymmetry in the right
breast.

RECOMMENDATION:
Diagnostic mammogram and possibly ultrasound of the right breast.
(Code:[id number])

The patient will be contacted regarding the findings, and additional
imaging will be scheduled.

BI-RADS CATEGORY  0: Incomplete. Need additional imaging evaluation
and/or prior mammograms for comparison.

## 2019-04-10 ENCOUNTER — Encounter: Payer: Self-pay | Admitting: Gynecology

## 2019-04-10 ENCOUNTER — Encounter: Payer: Medicare HMO | Admitting: Gynecology

## 2019-04-10 ENCOUNTER — Ambulatory Visit: Payer: Medicare HMO | Admitting: Gynecology

## 2019-04-10 ENCOUNTER — Other Ambulatory Visit: Payer: Self-pay

## 2019-04-10 VITALS — BP 130/70

## 2019-04-10 DIAGNOSIS — N952 Postmenopausal atrophic vaginitis: Secondary | ICD-10-CM

## 2019-04-10 DIAGNOSIS — N8111 Cystocele, midline: Secondary | ICD-10-CM | POA: Diagnosis not present

## 2019-04-10 DIAGNOSIS — N368 Other specified disorders of urethra: Secondary | ICD-10-CM | POA: Diagnosis not present

## 2019-04-10 NOTE — Progress Notes (Signed)
    Marie Alvarez 02/05/1931 [id number]        85 y.o.  [id number] presents complaining of pelvic/vaginal pressure at the end of the day after being on her feet.  Started about a month or so ago.  No precipitating event but she notes that she has been doing a lot of yard work with heavy lifting.  No urinary complaints other than some stress incontinence symptoms.  No frequency, dysuria.  No constipation.    Past medical history,surgical history, problem list, medications, allergies, family history and social history were all reviewed and documented in the EPIC chart.  Directed ROS with pertinent positives and negatives documented in the history of present illness/assessment and plan.  Exam: [name] assistant Vitals:   04/10/19 0859  BP: 130/70   General appearance:  Normal Abdomen soft nontender without masses guarding rebound Pelvic external BUS vagina with atrophic changes.  Midline suburethral cyst noted and stable from prior exams.  Second-degree cystocele with straining.  Cervix flush with upper vagina.  Uterus difficult to palpate but no gross masses or tenderness.  Adnexa without masses or tenderness.  Rectal exam is normal without significant rectocele.   Assessment/Plan:  85 y.o. [id number] with cystocele, new onset symptomatic to the patient.  She is very active and does a lot of heavy lifting.  We discussed the situation and I drew pictures for her understanding.  Options for management were reviewed to include expectant, pessary and surgery.  I discussed the pros and cons of each choice to include the risks of surgery in an 85 year old.  She is particularly bothered at the very end of the day but seems to do well throughout the day otherwise.  At this point I recommended we monitor over the next month or 2 and see how she does.  If it continues to be significantly symptomatic she is going to come back for a pessary fitting and we will see how she does with that.  She is not ready to  proceed with that right now.  If her symptoms improve and her quality of life is good doing nothing then she will follow expectantly.  Will check baseline urine analysis today.   [name] MD, 9:36 AM 04/10/2019

## 2019-04-10 NOTE — Patient Instructions (Signed)
Follow-up if the pelvic pressure continues to be a problem and we will proceed with the pessary trial

## 2019-04-11 ENCOUNTER — Other Ambulatory Visit: Payer: Self-pay | Admitting: *Deleted

## 2019-04-11 MED ORDER — NITROFURANTOIN MONOHYD MACRO 100 MG PO CAPS: 100.0000 mg | ORAL_CAPSULE | Freq: Two times a day (BID) | ORAL | 0 refills | Status: DC

## 2019-04-12 LAB — URINE CULTURE
MICRO NUMBER:: 435112
SPECIMEN QUALITY:: ADEQUATE

## 2019-04-12 LAB — URINALYSIS, COMPLETE W/RFL CULTURE
Bilirubin Urine: NEGATIVE
Glucose, UA: NEGATIVE
Hgb urine dipstick: NEGATIVE
Hyaline Cast: NONE SEEN /LPF
Ketones, ur: NEGATIVE
Nitrites, Initial: POSITIVE — AB
Protein, ur: NEGATIVE
RBC / HPF: NONE SEEN /HPF (ref 0–2)
Specific Gravity, Urine: 1.016 (ref 1.001–1.03)
Squamous Epithelial / LPF: NONE SEEN /HPF (ref <=5)
pH: <=5 (ref 5.0–8.0)

## 2019-04-12 LAB — CULTURE INDICATED

## 2019-05-15 ENCOUNTER — Telehealth: Payer: Self-pay | Admitting: *Deleted

## 2019-05-15 NOTE — Telephone Encounter (Signed)
Patient called c/o issues asked for return call. I called and left message to call me.

## 2019-05-20 ENCOUNTER — Other Ambulatory Visit: Payer: Self-pay

## 2019-05-21 ENCOUNTER — Encounter: Payer: Self-pay | Admitting: Gynecology

## 2019-05-21 ENCOUNTER — Ambulatory Visit: Payer: Medicare HMO | Admitting: Gynecology

## 2019-05-21 VITALS — BP 134/74

## 2019-05-21 DIAGNOSIS — Z4689 Encounter for fitting and adjustment of other specified devices: Secondary | ICD-10-CM

## 2019-05-21 DIAGNOSIS — N8111 Cystocele, midline: Secondary | ICD-10-CM

## 2019-05-21 NOTE — Patient Instructions (Signed)
Office will call you when the pessary becomes available for placement.

## 2019-05-21 NOTE — Progress Notes (Signed)
    Marie Alvarez 1931/09/15 [id number]        85 y.o.  [id number] presents having been seen end of April complaining of worsening symptoms from her cystocele.  She notes by the end of the day a lot of pressure and vaginal bulging.  Some SUI symptoms which have been stable over the years.  Past medical history,surgical history, problem list, medications, allergies, family history and social history were all reviewed and documented in the EPIC chart.  Directed ROS with pertinent positives and negatives documented in the history of present illness/assessment and plan.  Exam: [name] assistant Vitals:   05/21/19 1112  BP: 134/74   General appearance:  Normal Abdomen soft nontender without masses guarding rebound Pelvic external BUS vagina with atrophic changes.  Second-degree cystocele with straining.  Cervix flush with the vagina.  Uterus difficult to palpate but no gross masses or tenderness.  Adnexa without masses or tenderness.  Size 3 ring pessary with support was fitted.  The patient ambulated with bearing down and felt good with the pessary in place.  Assessment/Plan:  85 y.o. [id number] with cystocele causing symptoms.  We discussed the options for management to include observation, pessary trial and surgery.  Pros and cons of each choice discussed.  At age 34 the risks of surgery were outlined.  She did well with the pessary and is willing to try this.  We will order her pessary and she will return for placement next week.    [name] MD, 11:30 AM 05/21/2019

## 2019-05-21 NOTE — Telephone Encounter (Signed)
Late entry) patient called back and wanted to be fitted for pessary, she had appointment schedule today.

## 2019-05-23 ENCOUNTER — Other Ambulatory Visit: Payer: Self-pay

## 2019-05-24 ENCOUNTER — Encounter: Payer: Self-pay | Admitting: Gynecology

## 2019-05-24 ENCOUNTER — Ambulatory Visit: Payer: Medicare HMO | Admitting: Gynecology

## 2019-05-24 VITALS — BP 122/72

## 2019-05-24 DIAGNOSIS — Z4689 Encounter for fitting and adjustment of other specified devices: Secondary | ICD-10-CM | POA: Diagnosis not present

## 2019-05-24 DIAGNOSIS — N8111 Cystocele, midline: Secondary | ICD-10-CM

## 2019-05-24 NOTE — Progress Notes (Signed)
    Marie Alvarez 1931-01-12 [id number]        85 y.o.  [id number] presents for pessary placement.  Has cystocele and was fitted for a #3 ring pessary with support.  Past medical history,surgical history, problem list, medications, allergies, family history and social history were all reviewed and documented in the EPIC chart.  Directed ROS with pertinent positives and negatives documented in the history of present illness/assessment and plan.  Exam: [name] assistant Vitals:   05/24/19 1002  BP: 122/72   General appearance:  Normal Abdomen soft nontender without masses guarding rebound Pelvic external BUS vagina with atrophic changes.  Bimanual without masses or tenderness.  #3 ring pessary with support was placed without difficulty.  Assessment/Plan:  85 y.o. [id number] with cystocele for pessary placement.  She will follow-up in 1 month for reexamination.  She will follow-up sooner if she has any issues with the pessary.    [name] MD, 10:40 AM 05/24/2019

## 2019-05-24 NOTE — Patient Instructions (Signed)
Follow-up in 1 month for pessary recheck

## 2019-06-28 ENCOUNTER — Other Ambulatory Visit: Payer: Self-pay

## 2019-07-01 ENCOUNTER — Other Ambulatory Visit: Payer: Self-pay

## 2019-07-01 ENCOUNTER — Encounter: Payer: Self-pay | Admitting: Gynecology

## 2019-07-01 ENCOUNTER — Ambulatory Visit: Payer: Medicare HMO | Admitting: Gynecology

## 2019-07-01 VITALS — BP 124/74

## 2019-07-01 DIAGNOSIS — N39498 Other specified urinary incontinence: Secondary | ICD-10-CM | POA: Diagnosis not present

## 2019-07-01 DIAGNOSIS — Z4689 Encounter for fitting and adjustment of other specified devices: Secondary | ICD-10-CM

## 2019-07-01 DIAGNOSIS — N8189 Other female genital prolapse: Secondary | ICD-10-CM | POA: Diagnosis not present

## 2019-07-01 NOTE — Progress Notes (Signed)
    IMARI REEN November 06, 1931 [id number]        85 y.o.  [id number] presents in follow-up for pessary check.  Had ring pessary placed for cystocele.  Has done well since.  Past medical history,surgical history, problem list, medications, allergies, family history and social history were all reviewed and documented in the EPIC chart.  Directed ROS with pertinent positives and negatives documented in the history of present illness/assessment and plan.  Exam: [name] assistant Vitals:   07/01/19 1038  BP: 124/74   General appearance:  Normal Abdomen soft nontender without mass guarding rebound Pelvic external BUS vagina with atrophic changes.  Ring pessary removed.  Vaginal mucosa without significant irritation or erosion.  Bimanual without masses or tenderness.  Ring pessary was cleansed and replaced without difficulty  Assessment/Plan:  85 y.o. [id number] with good response to a ring pessary.  She will follow-up in 3 months for pessary recheck.  Sooner if any issues.  She does note some issues with intermittent incontinence.  Will check urine analysis today.   [name] MD, 10:51 AM 07/01/2019

## 2019-07-04 ENCOUNTER — Other Ambulatory Visit: Payer: Self-pay | Admitting: Gynecology

## 2019-07-04 LAB — CULTURE INDICATED

## 2019-07-04 LAB — URINALYSIS, COMPLETE W/RFL CULTURE
Bilirubin Urine: NEGATIVE
Glucose, UA: NEGATIVE
Hyaline Cast: NONE SEEN /LPF
Ketones, ur: NEGATIVE
Nitrites, Initial: NEGATIVE
Protein, ur: NEGATIVE
Specific Gravity, Urine: 1.02 (ref 1.001–1.03)
WBC, UA: >=60 /HPF — AB (ref 0–5)
pH: 5.5 (ref 5.0–8.0)

## 2019-07-04 LAB — URINE CULTURE
MICRO NUMBER:: 688128
SPECIMEN QUALITY:: ADEQUATE

## 2019-07-04 MED ORDER — CIPROFLOXACIN HCL 250 MG PO TABS: 250.0000 mg | ORAL_TABLET | Freq: Two times a day (BID) | ORAL | 0 refills | Status: DC

## 2019-09-09 DIAGNOSIS — R7301 Impaired fasting glucose: Secondary | ICD-10-CM | POA: Diagnosis not present

## 2019-09-09 DIAGNOSIS — D519 Vitamin B12 deficiency anemia, unspecified: Secondary | ICD-10-CM | POA: Diagnosis not present

## 2019-09-09 DIAGNOSIS — R69 Illness, unspecified: Secondary | ICD-10-CM | POA: Diagnosis not present

## 2019-09-09 DIAGNOSIS — D696 Thrombocytopenia, unspecified: Secondary | ICD-10-CM | POA: Diagnosis not present

## 2019-09-09 DIAGNOSIS — C50919 Malignant neoplasm of unspecified site of unspecified female breast: Secondary | ICD-10-CM | POA: Diagnosis not present

## 2019-09-09 DIAGNOSIS — I10 Essential (primary) hypertension: Secondary | ICD-10-CM | POA: Diagnosis not present

## 2019-09-12 DIAGNOSIS — E7849 Other hyperlipidemia: Secondary | ICD-10-CM | POA: Diagnosis not present

## 2019-09-12 DIAGNOSIS — R7301 Impaired fasting glucose: Secondary | ICD-10-CM | POA: Diagnosis not present

## 2019-09-12 DIAGNOSIS — Z23 Encounter for immunization: Secondary | ICD-10-CM | POA: Diagnosis not present

## 2019-09-17 DIAGNOSIS — M8589 Other specified disorders of bone density and structure, multiple sites: Secondary | ICD-10-CM | POA: Diagnosis not present

## 2019-09-17 DIAGNOSIS — R82998 Other abnormal findings in urine: Secondary | ICD-10-CM | POA: Diagnosis not present

## 2019-09-17 DIAGNOSIS — I10 Essential (primary) hypertension: Secondary | ICD-10-CM | POA: Diagnosis not present

## 2019-09-18 ENCOUNTER — Other Ambulatory Visit: Payer: Self-pay | Admitting: Hematology and Oncology

## 2019-09-19 ENCOUNTER — Encounter: Payer: Self-pay | Admitting: Gynecology

## 2019-09-24 ENCOUNTER — Other Ambulatory Visit: Payer: Self-pay

## 2019-09-25 ENCOUNTER — Ambulatory Visit: Payer: Medicare HMO | Admitting: Gynecology

## 2019-09-25 ENCOUNTER — Encounter: Payer: Self-pay | Admitting: Gynecology

## 2019-09-25 VITALS — BP 122/78

## 2019-09-25 DIAGNOSIS — Z4689 Encounter for fitting and adjustment of other specified devices: Secondary | ICD-10-CM | POA: Diagnosis not present

## 2019-09-25 DIAGNOSIS — N8111 Cystocele, midline: Secondary | ICD-10-CM | POA: Diagnosis not present

## 2019-09-25 MED ORDER — CLINDAMYCIN PHOSPHATE 2 % VA CREA: 1.0000 | TOPICAL_CREAM | Freq: Every day | VAGINAL | 0 refills | Status: DC

## 2019-09-25 NOTE — Progress Notes (Signed)
    Marie Alvarez 10-Jun-1931 [id number]        85 y.o.  [id number] presents for pessary check.  Using ring pessary for cystocele with good results.  Does have a discharge.  Is using the vaginal cream the cane with her pessary twice weekly which seems to help.  No bleeding.  Past medical history,surgical history, problem list, medications, allergies, family history and social history were all reviewed and documented in the EPIC chart.  Directed ROS with pertinent positives and negatives documented in the history of present illness/assessment and plan.  Exam: Marie Alvarez assistant Vitals:   09/25/19 1031  BP: 122/78   General appearance:  Normal Abdomen soft nontender without mass guarding rebound Pelvic external BUS vagina with atrophic changes.  Yellowish discharge noted.  Ring pessary removed cleansed and replaced without difficulty.  Vaginal mucosa without evidence of irritation or erosion.  Bimanual without masses or tenderness.  Assessment/Plan:  85 y.o. [id number] with ring pessary for cystocele with good results.  Does have a yellowish discharge which I think is bacterial.  Recommend Cleocin vaginal cream nightly x7 nights to see if it does not help with this.  She may need to use this prophylactically on a weekly basis 1 applicator if it seems to work but for now we will do the 7-day course and see how she responds.  She will follow-up in 3 months for pessary recheck.    Marie Auerbach MD, 10:50 AM 09/25/2019

## 2019-09-25 NOTE — Patient Instructions (Signed)
Use the prescribed vaginal antibiotic cream nightly for 7 nights to see if it does not help with the vaginal discharge.  Follow-up in 3 months for pessary check

## 2019-10-11 ENCOUNTER — Other Ambulatory Visit: Payer: Self-pay | Admitting: Adult Health

## 2019-10-11 DIAGNOSIS — Z853 Personal history of malignant neoplasm of breast: Secondary | ICD-10-CM

## 2019-11-18 ENCOUNTER — Other Ambulatory Visit: Payer: Self-pay

## 2019-11-18 ENCOUNTER — Ambulatory Visit
Admission: RE | Admit: 2019-11-18 | Discharge: 2019-11-18 | Disposition: A | Discharge status: Home / Self Care | Payer: Medicare HMO | Source: Ambulatory Visit | Attending: Adult Health | Admitting: Adult Health

## 2019-11-18 DIAGNOSIS — Z853 Personal history of malignant neoplasm of breast: Secondary | ICD-10-CM

## 2019-11-18 DIAGNOSIS — R922 Inconclusive mammogram: Secondary | ICD-10-CM | POA: Diagnosis not present

## 2019-12-11 DIAGNOSIS — H02135 Senile ectropion of left lower eyelid: Secondary | ICD-10-CM | POA: Diagnosis not present

## 2019-12-11 DIAGNOSIS — H0102A Squamous blepharitis right eye, upper and lower eyelids: Secondary | ICD-10-CM | POA: Diagnosis not present

## 2019-12-11 DIAGNOSIS — H0102B Squamous blepharitis left eye, upper and lower eyelids: Secondary | ICD-10-CM | POA: Diagnosis not present

## 2019-12-11 DIAGNOSIS — H04523 Eversion of bilateral lacrimal punctum: Secondary | ICD-10-CM | POA: Diagnosis not present

## 2019-12-11 DIAGNOSIS — H10413 Chronic giant papillary conjunctivitis, bilateral: Secondary | ICD-10-CM | POA: Diagnosis not present

## 2019-12-11 DIAGNOSIS — H04123 Dry eye syndrome of bilateral lacrimal glands: Secondary | ICD-10-CM | POA: Diagnosis not present

## 2019-12-11 DIAGNOSIS — H02132 Senile ectropion of right lower eyelid: Secondary | ICD-10-CM | POA: Diagnosis not present

## 2019-12-25 DIAGNOSIS — H02135 Senile ectropion of left lower eyelid: Secondary | ICD-10-CM | POA: Diagnosis not present

## 2019-12-25 DIAGNOSIS — H02132 Senile ectropion of right lower eyelid: Secondary | ICD-10-CM | POA: Diagnosis not present

## 2019-12-25 DIAGNOSIS — H04123 Dry eye syndrome of bilateral lacrimal glands: Secondary | ICD-10-CM | POA: Diagnosis not present

## 2019-12-25 DIAGNOSIS — H0102B Squamous blepharitis left eye, upper and lower eyelids: Secondary | ICD-10-CM | POA: Diagnosis not present

## 2019-12-25 DIAGNOSIS — H04523 Eversion of bilateral lacrimal punctum: Secondary | ICD-10-CM | POA: Diagnosis not present

## 2019-12-25 DIAGNOSIS — H0102A Squamous blepharitis right eye, upper and lower eyelids: Secondary | ICD-10-CM | POA: Diagnosis not present

## 2019-12-25 DIAGNOSIS — H10413 Chronic giant papillary conjunctivitis, bilateral: Secondary | ICD-10-CM | POA: Diagnosis not present

## 2019-12-26 ENCOUNTER — Ambulatory Visit: Payer: Medicare HMO | Admitting: Gynecology

## 2019-12-29 NOTE — Progress Notes (Signed)
Patient Care Team: Crist Infante, MD as PCP - General (Internal Medicine) Nicholas Lose, MD as Consulting Physician (Hematology and Oncology) Delice Bison, Charlestine Massed, NP as Nurse Practitioner (Hematology and Oncology) Eppie Gibson, MD as Attending Physician (Radiation Oncology) Fanny Skates, MD as Consulting Physician (General Surgery)  DIAGNOSIS:    ICD-10-CM   1. Malignant neoplasm of upper-outer quadrant of left breast in female, estrogen receptor positive ([place])  C50.412    Z17.0   2. Malignant neoplasm of upper-inner quadrant of right breast in female, estrogen receptor positive ([place])  C50.211    Z17.0     SUMMARY OF ONCOLOGIC HISTORY: Oncology History  Malignant neoplasm of upper-outer quadrant of left breast in female, estrogen receptor positive ([place])  07/01/2002 Surgery   Left breast lumpectomy, stage 2 invasive ductal carcinoma with DCIS ER positive PR positive HER-2 negative, 3.4 cm tumor   07/01/2002 Pathologic Stage   Stage IIA: T2 N0   08/06/2002 - 11/06/2002 Chemotherapy   Adjuvant chemotherapy with CMF 6   12/06/2002 - 12/31/2002 Radiation Therapy   Adjuvant radiation therapy   01/29/2003 - 01/06/2009 Anti-estrogen oral therapy   Tamoxifen for 1 year then Arimidex 1 mg daily   Malignant neoplasm of upper-inner quadrant of right breast in female, estrogen receptor positive ([place])  2003 Miscellaneous   Left breast lumpectomy, stage 2 invasive ductal carcinoma with DCIS ER positive PR positive HER-2 negative, 3.4 cm tumor, CMF x6 followed by radiation followed by tamoxifen and later anastrozole completed 2010    11/29/2017 Initial Diagnosis   Right breast biopsy 4 o'clock position: Grade 1-2 IDC with DCIS with necrosis, ER 100%, PR 100%, Ki-67 2%, HER-2 negative ratio 1.19; 7 o'clock position fibroadenoma; ultrasound detected 3 o'clock position 2 x 1.5 x 1.7 cm, T1CN0 stage I a clinical stage   01/12/2018 Surgery   Rt Lumpectomy: IDC grade 1 measuring 0.9 cm with  ext DCIS, Margins Neg, ER 100%, PR 100%, Ki-67 2%, HER-2 negative ratio 1.19, T1bNx Stage 1A     03/01/2018 - 03/26/2018 Radiation Therapy   Adjuvant radiation therapy   04/2018 -  Anti-estrogen oral therapy   Could not tolerate anastrozole or letrozole. Tolerating tamoxifen     CHIEF COMPLIANT: Follow-up of breast cancer on tamoxifen therapy  INTERVAL HISTORY: Marie Alvarez is a 86 y.o. with above-mentioned history of right breast cancer treated with lumpectomy, radiation, and who is currently on tamoxifen therapy. Mammogram on 11/18/19 showed no evidence of malignancy bilaterally. She presents to the clinic today for follow-up.   ALLERGIES:  is allergic to prednisone and sulfamethoxazole.  MEDICATIONS:  Current Outpatient Medications  Medication Sig Dispense Refill  . amLODipine (NORVASC) 5 MG tablet Take 5 mg by mouth daily.     . cholecalciferol (VITAMIN D) 1000 units tablet Take 1,000 Units by mouth daily.    . clindamycin (CLEOCIN) 2 % vaginal cream Place 1 Applicatorful vaginally at bedtime. 40 g 0  . doxazosin (CARDURA) 4 MG tablet Take 1 tablet (4 mg total) by mouth daily.    Marland Kitchen Fexofenadine HCl (ALLEGRA PO) Take by mouth.    . nortriptyline (PAMELOR) 10 MG capsule Take 10 mg by mouth at bedtime.    . pantoprazole (PROTONIX) 40 MG tablet Take 40 mg by mouth daily. May take a second 40 mg dose as needed for acid reflux    . pravastatin (PRAVACHOL) 40 MG tablet Take 40 mg by mouth daily.    . tamoxifen (NOLVADEX) 20 MG tablet TAKE 1 TABLET BY  MOUTH EVERY DAY 90 tablet 3  . vitamin B-12 (CYANOCOBALAMIN) 1000 MCG tablet Take 1,000 mcg by mouth daily.     No current facility-administered medications for this visit.    PHYSICAL EXAMINATION: ECOG PERFORMANCE STATUS: 1 - Symptomatic but completely ambulatory  Vitals:   12/30/19 1350  BP: (!) 158/65  Pulse: 94  Resp: 17  Temp: 98.3 F (36.8 C)  SpO2: 100%   Filed Weights   12/30/19 1350  Weight: 145 lb 9.6 oz (66 kg)     LABORATORY DATA:  I have reviewed the data as listed CMP Latest Ref Rng & Units 06/25/2018 01/08/2018 02/15/2017  Glucose 70 - 99 mg/dL 83 102(H) 93  BUN 8 - 23 mg/dL 17 16 12.5  Creatinine 0.44 - 1.00 mg/dL 0.78 0.76 0.8  Sodium 135 - 145 mmol/L 139 137 140  Potassium 3.5 - 5.1 mmol/L 4.0 4.5 4.1  Chloride 98 - 111 mmol/L 105 103 -  CO2 22 - 32 mmol/L 26 25 29   Calcium 8.9 - 10.3 mg/dL 10.0 9.7 10.5(H)  Total Protein 6.5 - 8.1 g/dL 7.4 - 7.2  Total Bilirubin 0.3 - 1.2 mg/dL 0.5 - 0.70  Alkaline Phos 38 - 126 U/L 72 - 75  AST 15 - 41 U/L 19 - 16  ALT 0 - 44 U/L 17 - 11    Lab Results  Component Value Date   WBC 5.7 06/25/2018   HGB 14.4 06/25/2018   HCT 43.8 06/25/2018   MCV 91.6 06/25/2018   PLT 162 06/25/2018   NEUTROABS 3.7 06/25/2018    ASSESSMENT & PLAN:  Malignant neoplasm of upper-inner quadrant of right breast in female, estrogen receptor positive ([facility]) 01/12/18:Rt Lumpectomy: IDC grade 1 measuring 0.9 cm with ext DCIS, Margins Neg, ER 100%, PR 100%, Ki-67 2%, HER-2 negative ratio 1.19, T1bNx Stage 1A (07/01/2002:Left breast lumpectomy, stage 2 invasive ductal carcinoma with DCIS ER positive PR positive HER-2 negative, 3.4 cm tumor T2N0 stage IIa, received adjuvant chemo radiation and antiestrogen therapy until 2010)  Adjuvant radiation therapy: 03/01/2018-03/26/2018  Treatment plan: Adjuvant antiestrogen therapy with letrozole 2.5 mg daily started5/12/2017, switched to anastrozole but she could not tolerate that either.  Switched to tamoxifen  Current Treatment: Tamoxifen. 20 mg daily. Patient is tolerating tamoxifen extremely well without any major problems.  Breast cancer surveillance: 1.Breast exam 12/30/2019: Benign 2.Mammogram   11/18/2019: Benign breast density category C     No orders of the defined types were placed in this encounter.  The patient has a good understanding of the overall plan. she agrees with it. she will call with any problems that  may develop before the next visit here.  Total time spent: 15 mins including face to face time and time spent for planning, charting and coordination of care  Nicholas Lose, MD 12/30/2019  I, Cloyde Reams Dorshimer, am acting as scribe for Dr. Nicholas Lose.  I have reviewed the above documentation for accuracy and completeness, and I agree with the above.

## 2019-12-30 ENCOUNTER — Other Ambulatory Visit: Payer: Self-pay

## 2019-12-30 ENCOUNTER — Inpatient Hospital Stay: Payer: Medicare HMO | Attending: Hematology and Oncology | Admitting: Hematology and Oncology

## 2019-12-30 VITALS — BP 158/65 | HR 94 | Temp 98.3°F | Resp 17 | Ht 62.0 in | Wt 145.6 lb

## 2019-12-30 DIAGNOSIS — C50211 Malignant neoplasm of upper-inner quadrant of right female breast: Secondary | ICD-10-CM

## 2019-12-30 DIAGNOSIS — C50412 Malignant neoplasm of upper-outer quadrant of left female breast: Secondary | ICD-10-CM | POA: Diagnosis not present

## 2019-12-30 DIAGNOSIS — Z170 Estrogen receptor positive status [ER+]: Secondary | ICD-10-CM | POA: Insufficient documentation

## 2019-12-30 MED ORDER — TAMOXIFEN CITRATE 20 MG PO TABS: 20.0000 mg | ORAL_TABLET | Freq: Every day | ORAL | 3 refills | Status: DC

## 2019-12-30 NOTE — Assessment & Plan Note (Signed)
01/12/18:Rt Lumpectomy: IDC grade 1 measuring 0.9 cm with ext DCIS, Margins Neg, ER 100%, PR 100%, Ki-67 2%, HER-2 negative ratio 1.19, T1bNx Stage 1A (07/01/2002:Left breast lumpectomy, stage 2 invasive ductal carcinoma with DCIS ER positive PR positive HER-2 negative, 3.4 cm tumor T2N0 stage IIa, received adjuvant chemo radiation and antiestrogen therapy until 2010)  Adjuvant radiation therapy: 03/01/2018-03/26/2018  Treatment plan: Adjuvant antiestrogen therapy with letrozole 2.5 mg daily started5/12/2017, switched to anastrozole but she could not tolerate that either.  Switched to tamoxifen  Current Treatment: Tamoxifen. 20 mg daily. Patient is tolerating tamoxifen extremely well without any major problems.  Breast cancer surveillance: 1.Breast exam 12/30/2019: Benign 2.Mammogram   11/18/2019: Benign breast density category C

## 2020-01-09 ENCOUNTER — Ambulatory Visit: Payer: Medicare HMO | Admitting: Obstetrics and Gynecology

## 2020-01-13 ENCOUNTER — Other Ambulatory Visit: Payer: Self-pay

## 2020-01-14 ENCOUNTER — Encounter: Payer: Self-pay | Admitting: Obstetrics and Gynecology

## 2020-01-14 ENCOUNTER — Ambulatory Visit: Payer: Medicare HMO | Admitting: Obstetrics and Gynecology

## 2020-01-14 VITALS — BP 122/78

## 2020-01-14 DIAGNOSIS — N898 Other specified noninflammatory disorders of vagina: Secondary | ICD-10-CM | POA: Diagnosis not present

## 2020-01-14 DIAGNOSIS — N8110 Cystocele, unspecified: Secondary | ICD-10-CM | POA: Diagnosis not present

## 2020-01-14 DIAGNOSIS — Z4689 Encounter for fitting and adjustment of other specified devices: Secondary | ICD-10-CM | POA: Diagnosis not present

## 2020-01-14 LAB — WET PREP FOR TRICH, YEAST, CLUE

## 2020-01-14 MED ORDER — METRONIDAZOLE 500 MG PO TABS: 500.0000 mg | ORAL_TABLET | Freq: Two times a day (BID) | ORAL | 0 refills | Status: DC

## 2020-01-14 NOTE — Patient Instructions (Signed)
We will plan to continue the pessary for now. We can discuss surgery a bit more in the future, but I would like you to discuss this prospect with your internist and kidney specialist.  I do think you would be undergoing a lot of risk by having a surgical procedure so it would be preferable to continue the pessary instead if you can tolerate it.  If you feel that increase frequency of pessary maintenance would be helpful to you, we can arrange for that. The vaginal test today did not indicate any infection, but given the increased vaginal discharge, we will try an antibiotic Flagyl twice daily for 7 days.  You must avoid any alcohol consumption while taking this medication as it can cause a bad reaction.

## 2020-01-14 NOTE — Progress Notes (Signed)
   Marie Alvarez  1931-05-18 [id number]  HPI The patient is a 86 y.o. [id number] who presents today for a pessary check.  She has been using a ring pessary for control of a cystocele.  She is concerned about increasing vaginal discharge since the pessary was placed.  She denies any vaginal bleeding.  Past medical history,surgical history, problem list, medications, allergies, family history and social history were all reviewed and documented as reviewed in the EPIC chart.  ROS:  No urinary tract symptoms. GYN ROS: no abnormal bleeding, pelvic pain, + discharge.   Physical Exam  BP 122/78  General: Pleasant female, no acute distress, alert and oriented PELVIC EXAM: VULVA: normal appearing vulva with no masses, tenderness or lesions, atrophic changes VAGINA: no erosions or irritation.  Yellowish green discharge noted with somewhat malodorous quality.  Ring pessary removed, cleansed and replaced without difficulty.  Vaginal wet mount indicates no hyphae, no Trichomonas, no clue cells, few WBC, moderate bacteria, 7 and 12 epithelial cells hpf  Assessment 86 yo [id number] presenting for pessary maintenance.  Plan The pessary seems to be working well for the patient, however she is bothered by the increased vaginal discharge.  Vaginal wet mount is negative today.  We discussed that with a foreign body in place this does increase the amount of vaginal discharge.  She does feel better after the device was cleansed and replaced.  I recommended empirically treating with Flagyl 500 mg twice daily for 7 days, cautioning her to avoid alcohol consumption while using this medication.  Common side effects are reviewed.  She does inquire about surgery today, but given her age and other medical risk factors, she is not a great candidate for surgery, and if the pessary is controlling her symptoms overall pretty well, then I would recommend just staying with the pessary for now.  We can discuss this more at her next pessary  maintenance visit in 3 months.  All questions were answered by the end of the visit.   Larey Days MD 01/14/20

## 2020-04-10 ENCOUNTER — Other Ambulatory Visit: Payer: Self-pay

## 2020-04-13 ENCOUNTER — Ambulatory Visit: Payer: Medicare HMO | Admitting: Obstetrics and Gynecology

## 2020-04-13 ENCOUNTER — Other Ambulatory Visit: Payer: Self-pay

## 2020-04-13 ENCOUNTER — Encounter: Payer: Self-pay | Admitting: Obstetrics and Gynecology

## 2020-04-13 VITALS — BP 130/74

## 2020-04-13 DIAGNOSIS — N8189 Other female genital prolapse: Secondary | ICD-10-CM

## 2020-04-13 DIAGNOSIS — N898 Other specified noninflammatory disorders of vagina: Secondary | ICD-10-CM

## 2020-04-13 DIAGNOSIS — N8111 Cystocele, midline: Secondary | ICD-10-CM

## 2020-04-13 DIAGNOSIS — Z4689 Encounter for fitting and adjustment of other specified devices: Secondary | ICD-10-CM

## 2020-04-13 LAB — WET PREP FOR TRICH, YEAST, CLUE

## 2020-04-13 MED ORDER — FLUCONAZOLE 150 MG PO TABS: 150.0000 mg | ORAL_TABLET | ORAL | 0 refills | Status: AC

## 2020-04-13 NOTE — Progress Notes (Signed)
   Marie Alvarez  1931/09/25 [id number]  HPI The patient is a 86 y.o. [id number] who presents today for a pessary check.  She has been using a ring pessary for control of a cystocele.  She is concerned about increasing vaginal discharge since the pessary was placed.  She denies any vaginal bleeding.  Past medical history,surgical history, problem list, medications, allergies, family history and social history were all reviewed and documented as reviewed in the EPIC chart.  ROS:  No urinary tract symptoms. GYN ROS: no abnormal bleeding, pelvic pain, + discharge.   Physical Exam  BP 130/74  General: Pleasant female, no acute distress, alert and oriented PELVIC EXAM: VULVA: normal appearing vulva with no masses, tenderness or lesions, atrophic changes VAGINA: no erosions or irritation.  Yellowish green discharge noted without odor.  Ring pessary removed, cleansed and replaced without difficulty.  Vaginal wet mount shows positive hyphae, negative trichomonas, negative clue cells, moderate WBC, moderate bacteria, 7-12 epithelial cells/hpf  Assessment 86 yo [id number] presenting for pessary maintenance.  Plan The pessary seems to be working well for the patient, however she is bothered by the increased vaginal discharge.  Vaginal wet mount indicates vaginal candidiasis, Rx for Diflucan 150 mg is provided and we will relay this to the patient by phone.  We discussed that with a foreign body in place this does increase the amount of vaginal discharge.  Next pessary maintenance visit in 3 months.  All questions were answered by the end of the visit.   Joseph Pierini MD 04/13/20

## 2020-05-28 ENCOUNTER — Ambulatory Visit (HOSPITAL_COMMUNITY)
Admission: EM | Admit: 2020-05-28 | Discharge: 2020-05-28 | Disposition: A | Discharge status: Other Acute Inpatient Hospital | Payer: Medicare HMO | Source: Home / Self Care

## 2020-05-28 ENCOUNTER — Encounter (HOSPITAL_COMMUNITY): Payer: Self-pay

## 2020-05-28 ENCOUNTER — Other Ambulatory Visit: Payer: Self-pay

## 2020-05-28 ENCOUNTER — Emergency Department (HOSPITAL_COMMUNITY): Payer: Medicare HMO

## 2020-05-28 ENCOUNTER — Emergency Department (HOSPITAL_COMMUNITY)
Admission: EM | Admit: 2020-05-28 | Discharge: 2020-05-28 | Disposition: A | Discharge status: Home / Self Care | Payer: Medicare HMO | Attending: Emergency Medicine | Admitting: Emergency Medicine

## 2020-05-28 DIAGNOSIS — I10 Essential (primary) hypertension: Secondary | ICD-10-CM | POA: Diagnosis not present

## 2020-05-28 DIAGNOSIS — Z853 Personal history of malignant neoplasm of breast: Secondary | ICD-10-CM | POA: Insufficient documentation

## 2020-05-28 DIAGNOSIS — Z79899 Other long term (current) drug therapy: Secondary | ICD-10-CM | POA: Diagnosis not present

## 2020-05-28 DIAGNOSIS — Z7982 Long term (current) use of aspirin: Secondary | ICD-10-CM | POA: Diagnosis not present

## 2020-05-28 DIAGNOSIS — R079 Chest pain, unspecified: Secondary | ICD-10-CM

## 2020-05-28 LAB — BASIC METABOLIC PANEL
Anion gap: 10 (ref 5–15)
BUN: 17 mg/dL (ref 8–23)
CO2: 23 mmol/L (ref 22–32)
Calcium: 9.2 mg/dL (ref 8.9–10.3)
Chloride: 109 mmol/L (ref 98–111)
Creatinine, Ser: 0.63 mg/dL (ref 0.44–1.00)
GFR calc Af Amer: >60 mL/min (ref >60)
GFR calc non Af Amer: >60 mL/min (ref >60)
Glucose, Bld: 107 mg/dL — ABNORMAL HIGH (ref 70–99)
Potassium: 4.1 mmol/L (ref 3.5–5.1)
Sodium: 142 mmol/L (ref 135–145)

## 2020-05-28 LAB — CBC
HCT: 39.2 % (ref 36.0–46.0)
Hemoglobin: 12.6 g/dL (ref 12.0–15.0)
MCH: 31.2 pg (ref 26.0–34.0)
MCHC: 32.1 g/dL (ref 30.0–36.0)
MCV: 97 fL (ref 80.0–100.0)
Platelets: 123 10*3/uL — ABNORMAL LOW (ref 150–400)
RBC: 4.04 MIL/uL (ref 3.87–5.11)
RDW: 13 % (ref 11.5–15.5)
WBC: 5.3 10*3/uL (ref 4.0–10.5)
nRBC: 0 % (ref 0.0–0.2)

## 2020-05-28 LAB — TROPONIN I (HIGH SENSITIVITY)
Troponin I (High Sensitivity): 7 ng/L (ref <18)
Troponin I (High Sensitivity): 8 ng/L (ref <18)

## 2020-05-28 MED ORDER — AMLODIPINE BESYLATE 5 MG PO TABS: 10.0000 mg | ORAL_TABLET | Freq: Once | ORAL | Status: DC

## 2020-05-28 MED ORDER — SODIUM CHLORIDE 0.9% FLUSH: 3.0000 mL | Freq: Once | INTRAVENOUS | Status: DC

## 2020-05-28 NOTE — Discharge Instructions (Signed)
Take tylenol for pain   Your heart enzymes tests are normal today   Keep a log of your blood pressure   See your doctor in a week   Return to ER if you have worse chest pain, trouble breathing

## 2020-05-28 NOTE — ED Triage Notes (Signed)
Patient c/o left chest pain and was at [facility]. Patient states she needed an EKG. Patient had an EKG at the UC.  Patient's daughter in law stated that the patient needed a cardiac workup.

## 2020-05-28 NOTE — ED Triage Notes (Signed)
Pt presents with left chest pain with no other complaints for the past few days.  Pt states she has had a death in the family earlier in the week and thinks it may be related to her chest pain and elevation in blood pressure.

## 2020-05-28 NOTE — ED Notes (Signed)
Pt a/o and ambulatory at discharge. Pt verbalized understanding of discharge instructions and follow up

## 2020-05-28 NOTE — ED Provider Notes (Signed)
[facility] DEPT Provider Note   CSN: [id number] Arrival date & time: 05/28/20  1829     History Chief Complaint  Patient presents with  . needs EKG    Marie Alvarez is a 86 y.o. female history of hyperlipidemia, hypertension, here presenting with chest pain.  States that her son died recently.  She has been very sad and tearful. She states that for the last several days, she has some left-sided chest pain is worse with movement.  She went to urgent care initially and was sent here for further evaluation.  Patient does not have any history of CAD or stents.  Patient does have a history of hypertension and is compliant with her medicines.  The history is provided by the patient.       Past Medical History:  Diagnosis Date  . Arthritis   . Breast cancer ([place]) 2018   Right  . Cancer ([place]) 2003   BREAST-LEFT  . GERD (gastroesophageal reflux disease)   . History of radiation therapy 03/01/18- 03/28/18   Right breast treated to 40.05 Gy in 15 fx followed by a boost of 10 Gy in 5 fx  . Hx estrogen therapy 02/02/2012  . Hyperlipidemia   . Hypertension   . Osteoporosis   . Personal history of chemotherapy    left 03  . Personal history of radiation therapy   . Scoliosis   . Shingles   . Suburethral cyst 03/2012    Patient Active Problem List   Diagnosis Date Noted  . Malignant neoplasm of upper-inner quadrant of right breast in female, estrogen receptor positive ([place]) 12/26/2017  . Hypertension 04/16/2012  . Hyperlipidemia 04/16/2012  . Chest pain 04/16/2012  . Malignant neoplasm of upper-outer quadrant of left breast in female, estrogen receptor positive ([place]) 02/02/2012  . Hx estrogen therapy 02/02/2012  . History of breast cancer in female 02/02/2012    Past Surgical History:  Procedure Laterality Date  . APPENDECTOMY  1946  . BREAST BIOPSY Left   . BREAST LUMPECTOMY Left 2003  . BREAST LUMPECTOMY Right 01/12/2018   invasive ductal   .  BREAST LUMPECTOMY WITH RADIOACTIVE SEED LOCALIZATION Right 01/12/2018   Procedure: RIGHT BREAST LUMPECTOMY WITH RADIOACTIVE SEED LOCALIZATION;  Surgeon: Fanny Skates, MD;  Location: [facility];  Service: General;  Laterality: Right;  . BREAST SURGERY  07-2002   LEFT LUMPECTOMY  . TONSILECTOMY, ADENOIDECTOMY, BILATERAL MYRINGOTOMY AND TUBES       OB History    Gravida  3   Para  2   Term  2   Preterm      AB  1   Living  2     SAB  1   TAB      Ectopic      Multiple      Live Births              Family History  Problem Relation Age of Onset  . Heart disease Mother        Angina  . Heart disease Father        Unknown    Social History   Tobacco Use  . Smoking status: Never Smoker  . Smokeless tobacco: Never Used  Vaping Use  . Vaping Use: Never used  Substance Use Topics  . Alcohol use: No    Alcohol/week: 0.0 standard drinks  . Drug use: No    Home Medications Prior to Admission medications   Medication Sig Start Date  End Date Taking? Authorizing Provider  amLODipine (NORVASC) 5 MG tablet Take 5 mg by mouth daily.    Yes [provider]  aspirin 325 MG tablet Take 650 mg by mouth every 6 (six) hours as needed for mild pain, moderate pain, fever or headache.   Yes [provider]  cholecalciferol (VITAMIN D) 1000 units tablet Take 1,000 Units by mouth daily.   Yes [provider]  doxazosin (CARDURA) 4 MG tablet Take 1 tablet (4 mg total) by mouth daily. 12/26/18  Yes Nicholas Lose, MD  losartan (COZAAR) 100 MG tablet Take 100 mg by mouth daily. 04/29/20  Yes [provider]  Multiple Vitamins-Minerals (PRESERVISION AREDS 2) CAPS Take 1 tablet by mouth 2 (two) times daily. 04/29/20  Yes [provider]  nortriptyline (PAMELOR) 10 MG capsule Take 10 mg by mouth at bedtime.   Yes [provider]  pantoprazole (PROTONIX) 40 MG tablet Take 40 mg by mouth daily. May take a second 40 mg dose as needed for acid  reflux   Yes [provider]  pravastatin (PRAVACHOL) 40 MG tablet Take 40 mg by mouth daily.   Yes [provider]  tamoxifen (NOLVADEX) 20 MG tablet Take 1 tablet (20 mg total) by mouth daily. 12/30/19  Yes Nicholas Lose, MD  triamcinolone cream (KENALOG) 0.1 % Apply 1 application topically in the morning and at bedtime. 05/05/20  Yes [provider]  vitamin B-12 (CYANOCOBALAMIN) 1000 MCG tablet Take 1,000 mcg by mouth daily.   Yes [provider]    Allergies    Prednisone and Sulfamethoxazole  Review of Systems   Review of Systems  Cardiovascular: Positive for chest pain.  All other systems reviewed and are negative.   Physical Exam Updated Vital Signs BP (!) 146/70   Pulse 72   Temp 97.9 F (36.6 C) (Oral)   Resp 18   Ht 5' 3.5" (1.613 m)   Wt 63 kg   SpO2 100%   BMI 24.24 kg/m   Physical Exam Vitals and nursing note reviewed.  HENT:     Head: Normocephalic.     Nose: Nose normal.     Mouth/Throat:     Mouth: Mucous membranes are moist.  Eyes:     Extraocular Movements: Extraocular movements intact.     Pupils: Pupils are equal, round, and reactive to light.  Cardiovascular:     Rate and Rhythm: Normal rate and regular rhythm.     Pulses: Normal pulses.     Heart sounds: Normal heart sounds.  Pulmonary:     Effort: Pulmonary effort is normal.     Breath sounds: Normal breath sounds.     Comments: Reproducible L chest wall tenderness  Abdominal:     General: Abdomen is flat.     Palpations: Abdomen is soft.  Musculoskeletal:        General: Normal range of motion.     Cervical back: Normal range of motion.  Skin:    General: Skin is warm.     Capillary Refill: Capillary refill takes less than 2 seconds.  Neurological:     General: No focal deficit present.     Mental Status: She is alert and oriented to person, place, and time.  Psychiatric:        Mood and Affect: Mood normal.        Behavior: Behavior normal.      ED Results / Procedures / Treatments   Labs (all labs ordered are listed,  but only abnormal results are displayed) Labs Reviewed  BASIC METABOLIC PANEL - Abnormal; Notable for the following components:      Result Value   Glucose, Bld 107 (*)    All other components within normal limits  CBC - Abnormal; Notable for the following components:   Platelets 123 (*)    All other components within normal limits  TROPONIN I (HIGH SENSITIVITY)  TROPONIN I (HIGH SENSITIVITY)    EKG EKG Interpretation  Date/Time:  Thursday May 28 2020 18:40:33 EDT Ventricular Rate:  84 PR Interval:    QRS Duration: 87 QT Interval:  376 QTC Calculation: 445 R Axis:   53 Text Interpretation: Sinus rhythm Borderline T abnormalities, anterior leads 12 Lead; Mason-Likar No significant change since last tracing Confirmed by Wandra Arthurs [id number]) on 05/28/2020 9:28:09 PM   Radiology DG Chest 2 View  Result Date: 05/28/2020 CLINICAL DATA:  Chest pain EXAM: CHEST - 2 VIEW COMPARISON:  03/18/2004 FINDINGS: The heart size and mediastinal contours are within normal limits. Both lungs are clear. The visualized skeletal structures are unremarkable. Postsurgical changes of the right breast. IMPRESSION: No active cardiopulmonary disease. Electronically Signed   By: Donavan Foil M.D.   On: 05/28/2020 19:48    Procedures Procedures (including critical care time)  Medications Ordered in ED Medications  sodium chloride flush (NS) 0.9 % injection 3 mL (has no administration in time range)    ED Course  I have reviewed the triage vital signs and the nursing notes.  Pertinent labs & imaging results that were available during my care of the patient were reviewed by me and considered in my medical decision making (see chart for details).    MDM Rules/Calculators/A&P                          SYLVESTER SALONGA is a 86 y.o. female here presenting with chest pain.  Patient is reproducible and patient's son recently  died.  It is atypical for ACS.  Will get troponin x2, CBC, BMP, chest x-ray.  I doubt PE or dissection.  10:18 PM Patient's blood pressure went down to 140s. Patient's troponin is negative x2.  Her chest x-ray is unremarkable.  Stable for discharge.  I offered to adjust her blood pressure medicine but she states that she will talk to her PCP, Dr. Abner Greenspan, about it.   Final Clinical Impression(s) / ED Diagnoses Final diagnoses:  None    Rx / DC Orders ED Discharge Orders    None       Drenda Freeze, MD 05/28/20 2218

## 2020-05-28 NOTE — ED Notes (Signed)
Patient is being discharged from the [facility] and sent to the Emergency Department via personal vehicle by son. Per provider Morley Kos, patient is stable but in need of higher level of care due to chest pain from unknown source. Patient is aware and verbalizes understanding of plan of care.     Vitals:   05/28/20 1807  BP: (!) 158/68  Pulse: 81  Resp: 17  Temp: 98 F (36.7 C)  SpO2: 100%

## 2020-07-16 ENCOUNTER — Encounter: Payer: Self-pay | Admitting: Obstetrics and Gynecology

## 2020-07-16 ENCOUNTER — Ambulatory Visit: Payer: Medicare HMO | Admitting: Obstetrics and Gynecology

## 2020-07-16 ENCOUNTER — Other Ambulatory Visit: Payer: Self-pay

## 2020-07-16 VITALS — BP 118/76

## 2020-07-16 DIAGNOSIS — N8111 Cystocele, midline: Secondary | ICD-10-CM | POA: Diagnosis not present

## 2020-07-16 DIAGNOSIS — Z4689 Encounter for fitting and adjustment of other specified devices: Secondary | ICD-10-CM

## 2020-07-16 DIAGNOSIS — N8189 Other female genital prolapse: Secondary | ICD-10-CM | POA: Diagnosis not present

## 2020-07-16 DIAGNOSIS — N898 Other specified noninflammatory disorders of vagina: Secondary | ICD-10-CM

## 2020-07-16 LAB — WET PREP FOR TRICH, YEAST, CLUE

## 2020-07-16 NOTE — Progress Notes (Signed)
   Marie Alvarez  10-26-1931 [id number]  HPI The patient is a 86 y.o. [id number] who presents today for a pessary check.  She has been using a ring pessary for control of a cystocele.  She is concerned about increasing vaginal discharge since the pessary was placed.  She denies any vaginal bleeding.  Feels she might be feeling slippage of the prolapse past the pessary particularly at nighttime.  No urinary symptoms.  Her son unexpectedly passed away from cardiac arrest, she reports she has not been eating very well.  Past medical history,surgical history, problem list, medications, allergies, family history and social history were all reviewed and documented as reviewed in the EPIC chart.  ROS:  No urinary tract symptoms. GYN ROS: no abnormal bleeding, pelvic pain, + discharge.   Physical Exam  BP 118/76 (BP Location: Right Arm, Patient Position: Sitting, Cuff Size: Normal)  General: Pleasant female, no acute distress, alert and oriented PELVIC EXAM: VULVA: normal appearing vulva with no masses, tenderness or lesions, atrophic changes VAGINA: no erosions or irritation.  Yellowish green discharge noted without odor.  Ring pessary removed with moderate discomfort, cleansed and replaced without difficulty.  [place] resident during the examination.  Vaginal wet mount shows good of hyphae, negative trichomonas, negative clue cells, few WBC, moderate bacteria, <6 epithelial cells/hpf  Assessment 86 yo [id number] presenting for pessary maintenance.  Plan The pessary seems to be working well for the patient, however she is still bothered by the increased vaginal discharge.  Vaginal wet mount is negative.  Last time she did have vaginal candidiasis in May and was treated with Diflucan (she did not recall).  We discussed that with a foreign body in place this does increase the amount of vaginal discharge.  If she is concerned about increased slippage of the pelvic organ prolapse, I recommended  considering alternative pessary fitting but she is not interested in this because she finds that removal of the current pessary is already uncomfortable enough.  Next pessary maintenance visit in 3-4 months.  All questions were answered by the end of the visit.   Joseph Pierini MD 07/16/20

## 2020-07-16 NOTE — Addendum Note (Signed)
Addended by: Lorine Bears on: 07/16/2020 03:12 PM   Modules accepted: Orders

## 2020-07-21 ENCOUNTER — Ambulatory Visit: Payer: Medicare HMO | Admitting: Podiatry

## 2020-07-23 ENCOUNTER — Other Ambulatory Visit: Payer: Self-pay

## 2020-07-23 ENCOUNTER — Ambulatory Visit: Payer: Medicare HMO | Admitting: Podiatry

## 2020-07-23 ENCOUNTER — Ambulatory Visit: Payer: Medicare HMO

## 2020-07-23 ENCOUNTER — Encounter: Payer: Self-pay | Admitting: Podiatry

## 2020-07-23 DIAGNOSIS — M722 Plantar fascial fibromatosis: Secondary | ICD-10-CM | POA: Diagnosis not present

## 2020-07-24 ENCOUNTER — Encounter: Payer: Self-pay | Admitting: Podiatry

## 2020-07-24 NOTE — Progress Notes (Signed)
Subjective:  Patient ID: Marie Alvarez, female    DOB: 1931-10-08,  MRN: [id number]  Chief Complaint  Patient presents with  . Plantar Fasciitis    Patient presents today for left heel pain.  She says "its some pain, but much better than when I called to schedule the appt"    86 y.o. female presents with the above complaint.  Patient presents with left heel pain that has been going on for quite some time.  Patient states that she had a history of plantar fasciitis that may have gotten aggravated.  She states she still has some residual pain but has improved over the last couple of weeks after she went to her primary care physician and was given stretching exercises.  She states that she wants to be able to walk without any issues.  She has tried stretching and Tylenol but there are still some residual pain at left.  The pain is sharp shooting in nature and is 3 out of 10.  She denies any other acute complaints.   Review of Systems: Negative except as noted in the HPI. Denies N/V/F/Ch.  Past Medical History:  Diagnosis Date  . Arthritis   . Breast cancer ([place]) 2018   Right  . Cancer ([place]) 2003   BREAST-LEFT  . GERD (gastroesophageal reflux disease)   . History of radiation therapy 03/01/18- 03/28/18   Right breast treated to 40.05 Gy in 15 fx followed by a boost of 10 Gy in 5 fx  . Hx estrogen therapy 02/02/2012  . Hyperlipidemia   . Hypertension   . Osteoporosis   . Personal history of chemotherapy    left 03  . Personal history of radiation therapy   . Scoliosis   . Shingles   . Suburethral cyst 03/2012    Current Outpatient Medications:  .  amLODipine (NORVASC) 5 MG tablet, Take 5 mg by mouth daily. , Disp: , Rfl:  .  amoxicillin-clavulanate (AUGMENTIN) 500-125 MG tablet, Take by mouth., Disp: , Rfl:  .  cholecalciferol (VITAMIN D) 1000 units tablet, Take 1,000 Units by mouth daily., Disp: , Rfl:  .  doxazosin (CARDURA) 4 MG tablet, Take 1 tablet (4 mg total) by mouth daily.,  Disp: , Rfl:  .  fexofenadine (ALLEGRA) 180 MG tablet, Take by mouth., Disp: , Rfl:  .  losartan (COZAAR) 100 MG tablet, Take 100 mg by mouth daily., Disp: , Rfl:  .  mirtazapine (REMERON) 15 MG tablet, Take 15 mg by mouth at bedtime., Disp: , Rfl:  .  Multiple Vitamins-Minerals (PRESERVISION AREDS 2) CAPS, Take 1 tablet by mouth 2 (two) times daily., Disp: , Rfl:  .  nortriptyline (PAMELOR) 10 MG capsule, Take 10 mg by mouth at bedtime., Disp: , Rfl:  .  Omega-3 1000 MG CAPS, Take by mouth., Disp: , Rfl:  .  pantoprazole (PROTONIX) 40 MG tablet, Take 40 mg by mouth daily. May take a second 40 mg dose as needed for acid reflux, Disp: , Rfl:  .  pravastatin (PRAVACHOL) 40 MG tablet, Take 40 mg by mouth daily., Disp: , Rfl:  .  tamoxifen (NOLVADEX) 20 MG tablet, Take 1 tablet (20 mg total) by mouth daily., Disp: 90 tablet, Rfl: 3 .  triamcinolone cream (KENALOG) 0.1 %, Apply 1 application topically in the morning and at bedtime., Disp: , Rfl:  .  vitamin B-12 (CYANOCOBALAMIN) 1000 MCG tablet, Take 1,000 mcg by mouth daily., Disp: , Rfl:   Social History   Tobacco Use  Smoking Status Never Smoker  Smokeless Tobacco Never Used    Allergies  Allergen Reactions  . Prednisone Other (See Comments)    "makes me goofy"  . Sulfamethoxazole Rash   Objective:  There were no vitals filed for this visit. There is no height or weight on file to calculate BMI. Constitutional Well developed. Well nourished.  Vascular Dorsalis pedis pulses palpable bilaterally. Posterior tibial pulses palpable bilaterally. Capillary refill normal to all digits.  No cyanosis or clubbing noted. Pedal hair growth normal.  Neurologic Normal speech. Oriented to person, place, and time. Epicritic sensation to light touch grossly present bilaterally.  Dermatologic Nails well groomed and normal in appearance. No open wounds. No skin lesions.  Orthopedic: Normal joint ROM without pain or crepitus bilaterally. No  visible deformities. Tender to palpation at the calcaneal tuber left. No pain with calcaneal squeeze left. Ankle ROM full range of motion left. Silfverskiold Test: negative left.   Radiographs: None  Assessment:   1. Plantar fasciitis of left foot    Plan:  Patient was evaluated and treated and all questions answered.  Plantar Fasciitis, left - XR reviewed as above.  - Educated on icing and stretching. Instructions given.  - Injection delivered to the plantar fascia as below. - DME: Plantar Fascial Brace - Pharmacologic management: Meloxicam/Medrol Dose Pak. Educated on risks/benefits and proper taking of medication.  Procedure: Injection Tendon/Ligament Location: Left plantar fascia at the glabrous junction; medial approach. Skin Prep: alcohol Injectate: 0.5 cc 0.5% marcaine plain, 0.5 cc of 1% Lidocaine, 0.5 cc kenalog 10. Disposition: Patient tolerated procedure well. Injection site dressed with a band-aid.  No follow-ups on file.

## 2020-08-27 ENCOUNTER — Encounter: Payer: Medicare HMO | Admitting: Podiatry

## 2020-10-14 ENCOUNTER — Other Ambulatory Visit: Payer: Self-pay | Admitting: Hematology and Oncology

## 2020-10-14 DIAGNOSIS — Z853 Personal history of malignant neoplasm of breast: Secondary | ICD-10-CM

## 2020-10-15 ENCOUNTER — Ambulatory Visit: Payer: Medicare HMO | Admitting: Obstetrics and Gynecology

## 2020-12-29 NOTE — Progress Notes (Incomplete)
Patient Care Team: Crist Infante, MD as PCP - General (Internal Medicine) Nicholas Lose, MD as Consulting Physician (Hematology and Oncology) Delice Bison, Charlestine Massed, NP as Nurse Practitioner (Hematology and Oncology) Eppie Gibson, MD as Attending Physician (Radiation Oncology) Fanny Skates, MD as Consulting Physician (General Surgery)  DIAGNOSIS:    ICD-10-CM   1. Malignant neoplasm of upper-outer quadrant of left breast in female, estrogen receptor positive ([place])  C50.412    Z17.0   2. Malignant neoplasm of upper-inner quadrant of right breast in female, estrogen receptor positive ([place])  C50.211    Z17.0     SUMMARY OF ONCOLOGIC HISTORY: Oncology History  Malignant neoplasm of upper-outer quadrant of left breast in female, estrogen receptor positive ([place])  07/01/2002 Surgery   Left breast lumpectomy, stage 2 invasive ductal carcinoma with DCIS ER positive PR positive HER-2 negative, 3.4 cm tumor   07/01/2002 Pathologic Stage   Stage IIA: T2 N0   08/06/2002 - 11/06/2002 Chemotherapy   Adjuvant chemotherapy with CMF 6   12/06/2002 - 12/31/2002 Radiation Therapy   Adjuvant radiation therapy   01/29/2003 - 01/06/2009 Anti-estrogen oral therapy   Tamoxifen for 1 year then Arimidex 1 mg daily   Malignant neoplasm of upper-inner quadrant of right breast in female, estrogen receptor positive ([place])  2003 Miscellaneous   Left breast lumpectomy, stage 2 invasive ductal carcinoma with DCIS ER positive PR positive HER-2 negative, 3.4 cm tumor, CMF x6 followed by radiation followed by tamoxifen and later anastrozole completed 2010    11/29/2017 Initial Diagnosis   Right breast biopsy 4 o'clock position: Grade 1-2 IDC with DCIS with necrosis, ER 100%, PR 100%, Ki-67 2%, HER-2 negative ratio 1.19; 7 o'clock position fibroadenoma; ultrasound detected 3 o'clock position 2 x 1.5 x 1.7 cm, T1CN0 stage I a clinical stage   01/12/2018 Surgery   Rt Lumpectomy: IDC grade 1 measuring 0.9 cm with  ext DCIS, Margins Neg, ER 100%, PR 100%, Ki-67 2%, HER-2 negative ratio 1.19, T1bNx Stage 1A     03/01/2018 - 03/26/2018 Radiation Therapy   Adjuvant radiation therapy   04/2018 -  Anti-estrogen oral therapy   Could not tolerate anastrozole or letrozole. Tolerating tamoxifen     CHIEF COMPLIANT: Follow-up of breast cancer on tamoxifen therapy  INTERVAL HISTORY: Marie Alvarez is a 87 y.o. with above-mentioned history of right breast cancer treated with lumpectomy, radiation, and who is currently on tamoxifen therapy. She presents to the clinictoday for follow-up.   ALLERGIES:  is allergic to prednisone and sulfamethoxazole.  MEDICATIONS:  Current Outpatient Medications  Medication Sig Dispense Refill  . amLODipine (NORVASC) 5 MG tablet Take 5 mg by mouth daily.     Marland Kitchen amoxicillin-clavulanate (AUGMENTIN) 500-125 MG tablet Take by mouth.    . cholecalciferol (VITAMIN D) 1000 units tablet Take 1,000 Units by mouth daily.    Marland Kitchen doxazosin (CARDURA) 4 MG tablet Take 1 tablet (4 mg total) by mouth daily.    . fexofenadine (ALLEGRA) 180 MG tablet Take by mouth.    . losartan (COZAAR) 100 MG tablet Take 100 mg by mouth daily.    . mirtazapine (REMERON) 15 MG tablet Take 15 mg by mouth at bedtime.    . Multiple Vitamins-Minerals (PRESERVISION AREDS 2) CAPS Take 1 tablet by mouth 2 (two) times daily.    . nortriptyline (PAMELOR) 10 MG capsule Take 10 mg by mouth at bedtime.    . Omega-3 1000 MG CAPS Take by mouth.    . pantoprazole (PROTONIX) 40 MG  tablet Take 40 mg by mouth daily. May take a second 40 mg dose as needed for acid reflux    . pravastatin (PRAVACHOL) 40 MG tablet Take 40 mg by mouth daily.    . tamoxifen (NOLVADEX) 20 MG tablet Take 1 tablet (20 mg total) by mouth daily. 90 tablet 3  . triamcinolone cream (KENALOG) 0.1 % Apply 1 application topically in the morning and at bedtime.    . vitamin B-12 (CYANOCOBALAMIN) 1000 MCG tablet Take 1,000 mcg by mouth daily.     No current  facility-administered medications for this visit.    PHYSICAL EXAMINATION: ECOG PERFORMANCE STATUS: {CHL ONC ECOG PS:[id number]}  There were no vitals filed for this visit. There were no vitals filed for this visit.  BREAST:*** No palpable masses or nodules in either right or left breasts. No palpable axillary supraclavicular or infraclavicular adenopathy no breast tenderness or nipple discharge. (exam performed in the presence of a chaperone)  LABORATORY DATA:  I have reviewed the data as listed CMP Latest Ref Rng & Units 05/28/2020 06/25/2018 01/08/2018  Glucose 70 - 99 mg/dL 107(H) 83 102(H)  BUN 8 - 23 mg/dL _0 Creatinine 0.44 - 1.00 mg/dL 0.63 0.78 0.76  Sodium 135 - 145 mmol/L 142 139 137  Potassium 3.5 - 5.1 mmol/L 4.1 4.0 4.5  Chloride 98 - 111 mmol/L 109 105 103  CO2 22 - 32 mmol/L _1 Calcium 8.9 - 10.3 mg/dL 9.2 10.0 9.7  Total Protein 6.5 - 8.1 g/dL - 7.4 -  Total Bilirubin 0.3 - 1.2 mg/dL - 0.5 -  Alkaline Phos 38 - 126 U/L - 72 -  AST 15 - 41 U/L - 19 -  ALT 0 - 44 U/L - 17 -    Lab Results  Component Value Date   WBC 5.3 05/28/2020   HGB 12.6 05/28/2020   HCT 39.2 05/28/2020   MCV 97.0 05/28/2020   PLT 123 (L) 05/28/2020   NEUTROABS 3.7 06/25/2018    ASSESSMENT & PLAN:  No problem-specific Assessment & Plan notes found for this encounter.    No orders of the defined types were placed in this encounter.  The patient has a good understanding of the overall plan. she agrees with it. she will call with any problems that may develop before the next visit here.  Total time spent: *** mins including face to face time and time spent for planning, charting and coordination of care  Nicholas Lose, MD 12/29/2020  I, Cloyde Reams Dorshimer, am acting as scribe for Dr. Nicholas Lose.  {insert scribe attestation}

## 2020-12-29 NOTE — Assessment & Plan Note (Deleted)
01/12/18:Rt Lumpectomy: IDC grade 1 measuring 0.9 cm with ext DCIS, Margins Neg, ER 100%, PR 100%, Ki-67 2%, HER-2 negative ratio 1.19, T1bNx Stage 1A (07/01/2002:Left breast lumpectomy, stage 2 invasive ductal carcinoma with DCIS ER positive PR positive HER-2 negative, 3.4 cm tumor T2N0 stage IIa, received adjuvant chemo radiation and antiestrogen therapy until 2010)  Adjuvant radiation therapy: 03/01/2018-03/26/2018  Treatment plan: Adjuvant antiestrogen therapy with letrozole 2.5 mg daily started5/12/2017, switched to anastrozole but she could not tolerate that either. Switched to tamoxifen  Current Treatment: Tamoxifen. 20 mg daily. Patient is tolerating tamoxifen extremely well without any major problems.  Breast cancer surveillance: 1.Breast exam 12/30/2019: Benign 2.Mammogram  11/18/2019: Benign breast density category C 2021 mammogram has not been done.  This was supposed to be due end of December 2021.  Return to clinic in 1 year for follow-up

## 2020-12-30 ENCOUNTER — Inpatient Hospital Stay: Payer: Medicare HMO | Admitting: Hematology and Oncology

## 2020-12-30 ENCOUNTER — Telehealth: Payer: Self-pay | Admitting: Hematology and Oncology

## 2020-12-30 DIAGNOSIS — Z170 Estrogen receptor positive status [ER+]: Secondary | ICD-10-CM

## 2020-12-30 DIAGNOSIS — C50412 Malignant neoplasm of upper-outer quadrant of left female breast: Secondary | ICD-10-CM

## 2020-12-30 NOTE — Telephone Encounter (Signed)
Left message to schedule follow-up appointment per 1/19 schedule message. Gave option to call back to schedule.

## 2021-02-01 NOTE — Progress Notes (Signed)
Patient Care Team: Crist Infante, MD as PCP - General (Internal Medicine) Marie Lose, MD as Consulting Physician (Hematology and Oncology) Gardenia Phlegm, NP as Nurse Practitioner (Hematology and Oncology) Eppie Gibson, MD as Attending Physician (Radiation Oncology) Fanny Skates, MD as Consulting Physician (General Surgery)  DIAGNOSIS:    ICD-10-CM   1. Malignant neoplasm of upper-outer quadrant of left breast in female, estrogen receptor positive ([place])  C50.412    Z17.0     SUMMARY OF ONCOLOGIC HISTORY: Oncology History  Malignant neoplasm of upper-outer quadrant of left breast in female, estrogen receptor positive ([place])  07/01/2002 Surgery   Left breast lumpectomy, stage 2 invasive ductal carcinoma with DCIS ER positive PR positive HER-2 negative, 3.4 cm tumor   07/01/2002 Pathologic Stage   Stage IIA: T2 N0   08/06/2002 - 11/06/2002 Chemotherapy   Adjuvant chemotherapy with CMF 6   12/06/2002 - 12/31/2002 Radiation Therapy   Adjuvant radiation therapy   01/29/2003 - 01/06/2009 Anti-estrogen oral therapy   Tamoxifen for 1 year then Arimidex 1 mg daily   Malignant neoplasm of upper-inner quadrant of right breast in female, estrogen receptor positive ([place])  2003 Miscellaneous   Left breast lumpectomy, stage 2 invasive ductal carcinoma with DCIS ER positive PR positive HER-2 negative, 3.4 cm tumor, CMF x6 followed by radiation followed by tamoxifen and later anastrozole completed 2010    11/29/2017 Initial Diagnosis   Right breast biopsy 4 o'clock position: Grade 1-2 IDC with DCIS with necrosis, ER 100%, PR 100%, Ki-67 2%, HER-2 negative ratio 1.19; 7 o'clock position fibroadenoma; ultrasound detected 3 o'clock position 2 x 1.5 x 1.7 cm, T1CN0 stage I a clinical stage   01/12/2018 Surgery   Rt Lumpectomy: IDC grade 1 measuring 0.9 cm with ext DCIS, Margins Neg, ER 100%, PR 100%, Ki-67 2%, HER-2 negative ratio 1.19, T1bNx Stage 1A     03/01/2018 - 03/26/2018  Radiation Therapy   Adjuvant radiation therapy   04/2018 -  Anti-estrogen oral therapy   Could not tolerate anastrozole or letrozole. Tolerating tamoxifen     CHIEF COMPLIANT: Follow-up of breast cancer on tamoxifen   INTERVAL HISTORY: Marie Alvarez is a 87 y.o. with above-mentioned history of right breast cancer treated with lumpectomy, radiation, and who is currently on tamoxifen therapy.She presents to the clinictoday for follow-up.   She is tolerating tamoxifen extremely well without any problems or concerns.  She lost her son last year and is still grieving from his loss.  She denies any lumps or nodules in the breast.  Her mammogram has been postponed till March 2022.  ALLERGIES:  is allergic to prednisone and sulfamethoxazole.  MEDICATIONS:  Current Outpatient Medications  Medication Sig Dispense Refill   amLODipine (NORVASC) 5 MG tablet Take 5 mg by mouth daily.      cholecalciferol (VITAMIN D) 1000 units tablet Take 1,000 Units by mouth daily.     doxazosin (CARDURA) 4 MG tablet Take 1 tablet (4 mg total) by mouth daily.     fexofenadine (ALLEGRA) 180 MG tablet Take by mouth.     losartan (COZAAR) 100 MG tablet Take 100 mg by mouth daily.     mirtazapine (REMERON) 15 MG tablet Take 15 mg by mouth at bedtime.     Multiple Vitamins-Minerals (PRESERVISION AREDS 2) CAPS Take 1 tablet by mouth 2 (two) times daily.     nortriptyline (PAMELOR) 10 MG capsule Take 10 mg by mouth at bedtime.     Omega-3 1000 MG CAPS Take  mouth.    °• pantoprazole (PROTONIX) 40 MG tablet Take 40 mg by mouth daily. May take a second 40 mg dose as needed for acid reflux    °• pravastatin (PRAVACHOL) 40 MG tablet Take 40 mg by mouth daily.    °• tamoxifen (NOLVADEX) 20 MG tablet Take 1 tablet (20 mg total) by mouth daily. 90 tablet 3  °• triamcinolone cream (KENALOG) 0.1 % Apply 1 application topically in the morning and at bedtime.    °• vitamin B-12 (CYANOCOBALAMIN) 1000 MCG tablet Take 1,000 mcg by  mouth daily.    ° °No current facility-administered medications for this visit.  ° ° °PHYSICAL EXAMINATION: °ECOG PERFORMANCE STATUS: 1 - Symptomatic but completely ambulatory ° °Vitals:  ° 02/02/21 1026  °BP: (!) 145/57  °Pulse: 99  °Resp: 18  °Temp: 98.1 °F (36.7 °C)  °SpO2: 99%  ° °Filed Weights  ° 02/02/21 1026  °Weight: 137 lb (62.1 kg)  ° °  ° °LABORATORY DATA:  °I have reviewed the data as listed °CMP Latest Ref Rng & Units 05/28/2020 06/25/2018 01/08/2018  °Glucose 70 - 99 mg/dL 107(H) 83 102(H)  °BUN 8 - 23 mg/dL 17 17 16  °Creatinine 0.44 - 1.00 mg/dL 0.63 0.78 0.76  °Sodium 135 - 145 mmol/L 142 139 137  °Potassium 3.5 - 5.1 mmol/L 4.1 4.0 4.5  °Chloride 98 - 111 mmol/L 109 105 103  °CO2 22 - 32 mmol/L 23 26 25  °Calcium 8.9 - 10.3 mg/dL 9.2 10.0 9.7  °Total Protein 6.5 - 8.1 g/dL - 7.4 -  °Total Bilirubin 0.3 - 1.2 mg/dL - 0.5 -  °Alkaline Phos 38 - 126 U/L - 72 -  °AST 15 - 41 U/L - 19 -  °ALT 0 - 44 U/L - 17 -  ° ° °Lab Results  °Component Value Date  ° WBC 5.3 05/28/2020  ° HGB 12.6 05/28/2020  ° HCT 39.2 05/28/2020  ° MCV 97.0 05/28/2020  ° PLT 123 (L) 05/28/2020  ° NEUTROABS 3.7 06/25/2018  ° ° °ASSESSMENT & PLAN:  °Malignant neoplasm of upper-outer quadrant of left breast in female, estrogen receptor positive ([facility]) °01/12/18: Rt Lumpectomy: IDC grade 1 measuring 0.9 cm with ext DCIS, Margins Neg, ER 100%, PR 100%, Ki-67 2%, HER-2 negative ratio 1.19, T1bNx Stage 1A °(07/01/2002:Left breast lumpectomy, stage 2 invasive ductal carcinoma with DCIS ER positive PR positive HER-2 negative, 3.4 cm tumor T2N0 stage IIa, received adjuvant chemo radiation and antiestrogen therapy until 2010) °  °Adjuvant radiation therapy: 03/01/2018-03/26/2018 °  °Treatment plan: Adjuvant antiestrogen therapy with letrozole 2.5 mg daily started 04/11/2018, switched to anastrozole but she could not tolerate that either.  Switched to tamoxifen °  °Current Treatment: Tamoxifen. 20 mg daily. °Patient is tolerating tamoxifen extremely well  without any major problems. °  °Patient son passed away last year from a acute coronary syndrome.  She is still grieving from his loss. °She had lost significant weight but she has gained some of that back. ° °Breast cancer surveillance: °1.  Breast exam  : Benign °2. Mammogram scheduled for March 2022 ° °RTC in 1 year °  °No orders of the defined types were placed in this encounter. ° °The patient has a good understanding of the overall plan. she agrees with it. she will call with any problems that may develop before the next visit here. ° °Total time spent: 20 mins including face to face time and time spent for planning, charting and coordination of care ° °Vinay K Gudena, MD, MPH °02/02/2021 ° °I, Molly Dorshimer, am acting as   as scribe for Dr. Nicholas Alvarez.  I have reviewed the above documentation for accuracy and completeness, and I agree with the above.

## 2021-02-01 NOTE — Assessment & Plan Note (Signed)
01/12/18:Rt Lumpectomy: IDC grade 1 measuring 0.9 cm with ext DCIS, Margins Neg, ER 100%, PR 100%, Ki-67 2%, HER-2 negative ratio 1.19, T1bNx Stage 1A (07/01/2002:Left breast lumpectomy, stage 2 invasive ductal carcinoma with DCIS ER positive PR positive HER-2 negative, 3.4 cm tumor T2N0 stage IIa, received adjuvant chemo radiation and antiestrogen therapy until 2010)  Adjuvant radiation therapy: 03/01/2018-03/26/2018  Treatment plan: Adjuvant antiestrogen therapy with letrozole 2.5 mg daily started5/12/2017, switched to anastrozole but she could not tolerate that either. Switched to tamoxifen  Current Treatment: Tamoxifen. 20 mg daily. Patient is tolerating tamoxifen extremely well without any major problems.  Breast cancer surveillance: 1.Breast exam 02/02/21: Benign 2.Mammogram scheduled for March 2022  RTC in 1 year

## 2021-02-02 ENCOUNTER — Other Ambulatory Visit: Payer: Self-pay

## 2021-02-02 ENCOUNTER — Telehealth: Payer: Self-pay | Admitting: Hematology and Oncology

## 2021-02-02 ENCOUNTER — Inpatient Hospital Stay: Payer: Medicare HMO | Attending: Hematology and Oncology | Admitting: Hematology and Oncology

## 2021-02-02 DIAGNOSIS — C50211 Malignant neoplasm of upper-inner quadrant of right female breast: Secondary | ICD-10-CM | POA: Diagnosis not present

## 2021-02-02 DIAGNOSIS — Z170 Estrogen receptor positive status [ER+]: Secondary | ICD-10-CM

## 2021-02-02 DIAGNOSIS — C50412 Malignant neoplasm of upper-outer quadrant of left female breast: Secondary | ICD-10-CM | POA: Diagnosis not present

## 2021-02-02 DIAGNOSIS — Z923 Personal history of irradiation: Secondary | ICD-10-CM | POA: Insufficient documentation

## 2021-02-02 DIAGNOSIS — Z853 Personal history of malignant neoplasm of breast: Secondary | ICD-10-CM | POA: Insufficient documentation

## 2021-02-02 NOTE — Telephone Encounter (Signed)
Scheduled appts per 2/22 los. Gave pt a print out of AVS.

## 2021-02-11 ENCOUNTER — Ambulatory Visit
Admission: RE | Admit: 2021-02-11 | Discharge: 2021-02-11 | Disposition: A | Discharge status: Home / Self Care | Payer: Medicare HMO | Source: Ambulatory Visit | Attending: Hematology and Oncology | Admitting: Hematology and Oncology

## 2021-02-11 ENCOUNTER — Other Ambulatory Visit: Payer: Self-pay

## 2021-02-11 DIAGNOSIS — Z853 Personal history of malignant neoplasm of breast: Secondary | ICD-10-CM

## 2021-02-26 ENCOUNTER — Other Ambulatory Visit: Payer: Self-pay | Admitting: Hematology and Oncology

## 2021-12-30 ENCOUNTER — Other Ambulatory Visit: Payer: Self-pay | Admitting: Internal Medicine

## 2021-12-30 DIAGNOSIS — Z1231 Encounter for screening mammogram for malignant neoplasm of breast: Secondary | ICD-10-CM

## 2022-02-01 NOTE — Progress Notes (Signed)
Patient Care Team: Crist Infante, MD as PCP - General (Internal Medicine) Nicholas Lose, MD as Consulting Physician (Hematology and Oncology) Gardenia Phlegm, NP as Nurse Practitioner (Hematology and Oncology) Eppie Gibson, MD as Attending Physician (Radiation Oncology) Fanny Skates, MD as Consulting Physician (General Surgery)  DIAGNOSIS:    ICD-10-CM   1. Malignant neoplasm of upper-outer quadrant of left breast in female, estrogen receptor positive ([place])  C50.412    Z17.0       SUMMARY OF ONCOLOGIC HISTORY: Oncology History  Malignant neoplasm of upper-outer quadrant of left breast in female, estrogen receptor positive ([place])  07/01/2002 Surgery   Left breast lumpectomy, stage 2 invasive ductal carcinoma with DCIS ER positive PR positive HER-2 negative, 3.4 cm tumor   07/01/2002 Pathologic Stage   Stage IIA: T2 N0   08/06/2002 - 11/06/2002 Chemotherapy   Adjuvant chemotherapy with CMF 6   12/06/2002 - 12/31/2002 Radiation Therapy   Adjuvant radiation therapy   01/29/2003 - 01/06/2009 Anti-estrogen oral therapy   Tamoxifen for 1 year then Arimidex 1 mg daily   Malignant neoplasm of upper-inner quadrant of right breast in female, estrogen receptor positive ([place])  2003 Miscellaneous   Left breast lumpectomy, stage 2 invasive ductal carcinoma with DCIS ER positive PR positive HER-2 negative, 3.4 cm tumor, CMF x6 followed by radiation followed by tamoxifen and later anastrozole completed 2010    11/29/2017 Initial Diagnosis   Right breast biopsy 4 o'clock position: Grade 1-2 IDC with DCIS with necrosis, ER 100%, PR 100%, Ki-67 2%, HER-2 negative ratio 1.19; 7 o'clock position fibroadenoma; ultrasound detected 3 o'clock position 2 x 1.5 x 1.7 cm, T1CN0 stage I a clinical stage   01/12/2018 Surgery   Rt Lumpectomy: IDC grade 1 measuring 0.9 cm with ext DCIS, Margins Neg, ER 100%, PR 100%, Ki-67 2%, HER-2 negative ratio 1.19, T1bNx Stage 1A     03/01/2018 - 03/26/2018  Radiation Therapy   Adjuvant radiation therapy   04/2018 -  Anti-estrogen oral therapy   Could not tolerate anastrozole or letrozole. Tolerating tamoxifen     CHIEF COMPLIANT: Follow-up of breast cancer on tamoxifen   INTERVAL HISTORY: Marie Alvarez is a 88 y.o. with above-mentioned history of right breast cancer treated with lumpectomy, radiation, and who is currently on tamoxifen therapy. Mammogram on 02/11/2021 showed no evidence of malignancy. She presents to the clinic today for follow-up.  She is tolerating tamoxifen extremely well without any problems or concerns.  She does have mild tenderness in the surgical scars but otherwise without any lumps or nodules in the breast.  ALLERGIES:  is allergic to prednisone and sulfamethoxazole.  MEDICATIONS:  Current Outpatient Medications  Medication Sig Dispense Refill   amLODipine (NORVASC) 5 MG tablet Take 5 mg by mouth daily.      cholecalciferol (VITAMIN D) 1000 units tablet Take 1,000 Units by mouth daily.     doxazosin (CARDURA) 4 MG tablet Take 1 tablet (4 mg total) by mouth daily.     fexofenadine (ALLEGRA) 180 MG tablet Take by mouth.     losartan (COZAAR) 100 MG tablet Take 100 mg by mouth daily.     mirtazapine (REMERON) 15 MG tablet Take 15 mg by mouth at bedtime.     Multiple Vitamins-Minerals (PRESERVISION AREDS 2) CAPS Take 1 tablet by mouth 2 (two) times daily.     nortriptyline (PAMELOR) 10 MG capsule Take 3 capsules (30 mg total) by mouth at bedtime.     pantoprazole (PROTONIX) 40 MG tablet  Take 40 mg by mouth daily. May take a second 40 mg dose as needed for acid reflux     pravastatin (PRAVACHOL) 40 MG tablet Take 40 mg by mouth daily.     tamoxifen (NOLVADEX) 20 MG tablet Take 1 tablet (20 mg total) by mouth daily. 90 tablet 4   triamcinolone cream (KENALOG) 0.1 % Apply 1 application topically in the morning and at bedtime.     vitamin B-12 (CYANOCOBALAMIN) 1000 MCG tablet Take 1,000 mcg by mouth daily.     No current  facility-administered medications for this visit.    PHYSICAL EXAMINATION: ECOG PERFORMANCE STATUS: 1 - Symptomatic but completely ambulatory  Vitals:   02/02/22 1105  BP: 140/76  Pulse: 88  Resp: (!) 98  Temp: 97.9 F (36.6 C)  SpO2: 99%   Filed Weights   02/02/22 1105  Weight: 139 lb 4.8 oz (63.2 kg)    BREAST: No palpable masses or nodules in either right or left breasts. No palpable axillary supraclavicular or infraclavicular adenopathy no breast tenderness or nipple discharge. (exam performed in the presence of a chaperone)  LABORATORY DATA:  I have reviewed the data as listed CMP Latest Ref Rng & Units 05/28/2020 06/25/2018 01/08/2018  Glucose 70 - 99 mg/dL 107(H) 83 102(H)  BUN 8 - 23 mg/dL 17 17 16   Creatinine 0.44 - 1.00 mg/dL 0.63 0.78 0.76  Sodium 135 - 145 mmol/L 142 139 137  Potassium 3.5 - 5.1 mmol/L 4.1 4.0 4.5  Chloride 98 - 111 mmol/L 109 105 103  CO2 22 - 32 mmol/L 23 26 25   Calcium 8.9 - 10.3 mg/dL 9.2 10.0 9.7  Total Protein 6.5 - 8.1 g/dL - 7.4 -  Total Bilirubin 0.3 - 1.2 mg/dL - 0.5 -  Alkaline Phos 38 - 126 U/L - 72 -  AST 15 - 41 U/L - 19 -  ALT 0 - 44 U/L - 17 -    Lab Results  Component Value Date   WBC 5.3 05/28/2020   HGB 12.6 05/28/2020   HCT 39.2 05/28/2020   MCV 97.0 05/28/2020   PLT 123 (L) 05/28/2020   NEUTROABS 3.7 06/25/2018    ASSESSMENT & PLAN:  Malignant neoplasm of upper-outer quadrant of left breast in female, estrogen receptor positive ([place]) 01/12/18: Rt Lumpectomy: IDC grade 1 measuring 0.9 cm with ext DCIS, Margins Neg, ER 100%, PR 100%, Ki-67 2%, HER-2 negative ratio 1.19, T1bNx Stage 1A (07/01/2002:Left breast lumpectomy, stage 2 invasive ductal carcinoma with DCIS ER positive PR positive HER-2 negative, 3.4 cm tumor T2N0 stage IIa, received adjuvant chemo radiation and antiestrogen therapy until Feb 23, 2009)   Adjuvant radiation therapy: 03/01/2018-03/26/2018   Treatment plan: Adjuvant antiestrogen therapy with letrozole 2.5 mg  daily started 04/11/2018, switched to anastrozole but she could not tolerate that either.  Switched to tamoxifen   Current Treatment: Tamoxifen. 20 mg daily. Patient is tolerating tamoxifen extremely well without any major problems.   Patient son passed away 02-24-2020 from a acute coronary syndrome.   She had lost significant weight but she has gained some of that back.   Breast cancer surveillance: 1.  Breast exam 02/02/22 : Benign 2. Mammogram scheduled for March 2023   RTC in 1 year    No orders of the defined types were placed in this encounter.  The patient has a good understanding of the overall plan. she agrees with it. she will call with any problems that may develop before the next visit here.  Total time spent:  20 mins including face to face time and time spent for planning, charting and coordination of care  Rulon Eisenmenger, MD, MPH 02/02/2022  I, Thana Ates, am acting as scribe for Dr. Nicholas Lose.  I have reviewed the above documentation for accuracy and completeness, and I agree with the above.

## 2022-02-01 NOTE — Assessment & Plan Note (Signed)
01/12/18:Rt Lumpectomy: IDC grade 1 measuring 0.9 cm with ext DCIS, Margins Neg, ER 100%, PR 100%, Ki-67 2%, HER-2 negative ratio 1.19, T1bNx Stage 1A (07/01/2002:Left breast lumpectomy, stage 2 invasive ductal carcinoma with DCIS ER positive PR positive HER-2 negative, 3.4 cm tumorT2N0 stage IIa, received adjuvant chemo radiation and antiestrogen therapy until 2010)  Adjuvant radiation therapy: 03/01/2018-03/26/2018  Treatment plan: Adjuvant antiestrogen therapy with letrozole 2.5 mg daily started5/12/2017, switched to anastrozole but she could not tolerate that either. Switched to tamoxifen  Current Treatment: Tamoxifen. 20 mg daily. Patient is tolerating tamoxifen extremely well without any major problems.  Patient son passed away last year from a acute coronary syndrome.  She is still grieving from his loss. She had lost significant weight but she has gained some of that back.  Breast cancer surveillance: 1.Breast exam2/22/23 : Benign 2.Mammogramscheduled for March 2023  RTC in 1 year

## 2022-02-02 ENCOUNTER — Other Ambulatory Visit: Payer: Self-pay

## 2022-02-02 ENCOUNTER — Inpatient Hospital Stay: Payer: Medicare HMO | Attending: Hematology and Oncology | Admitting: Hematology and Oncology

## 2022-02-02 DIAGNOSIS — Z923 Personal history of irradiation: Secondary | ICD-10-CM | POA: Diagnosis not present

## 2022-02-02 DIAGNOSIS — C50412 Malignant neoplasm of upper-outer quadrant of left female breast: Secondary | ICD-10-CM | POA: Insufficient documentation

## 2022-02-02 DIAGNOSIS — Z170 Estrogen receptor positive status [ER+]: Secondary | ICD-10-CM | POA: Diagnosis not present

## 2022-02-02 MED ORDER — NORTRIPTYLINE HCL 10 MG PO CAPS: 30.0000 mg | ORAL_CAPSULE | Freq: Every day | ORAL | Status: AC

## 2022-02-02 MED ORDER — TAMOXIFEN CITRATE 20 MG PO TABS: 20.0000 mg | ORAL_TABLET | Freq: Every day | ORAL | 4 refills | Status: DC

## 2022-02-14 ENCOUNTER — Ambulatory Visit
Admission: RE | Admit: 2022-02-14 | Discharge: 2022-02-14 | Disposition: A | Discharge status: Home / Self Care | Payer: Medicare HMO | Source: Ambulatory Visit | Attending: Internal Medicine | Admitting: Internal Medicine

## 2022-02-14 DIAGNOSIS — Z1231 Encounter for screening mammogram for malignant neoplasm of breast: Secondary | ICD-10-CM

## 2022-11-04 ENCOUNTER — Other Ambulatory Visit: Payer: Self-pay

## 2022-11-04 ENCOUNTER — Emergency Department: Payer: Medicare HMO

## 2022-11-04 ENCOUNTER — Emergency Department
Admission: EM | Admit: 2022-11-04 | Discharge: 2022-11-05 | Disposition: A | Discharge status: Home / Self Care | Payer: Medicare HMO | Attending: Emergency Medicine | Admitting: Emergency Medicine

## 2022-11-04 DIAGNOSIS — L03115 Cellulitis of right lower limb: Secondary | ICD-10-CM | POA: Insufficient documentation

## 2022-11-04 DIAGNOSIS — M79661 Pain in right lower leg: Secondary | ICD-10-CM | POA: Diagnosis present

## 2022-11-04 LAB — COMPREHENSIVE METABOLIC PANEL
ALT: 13 U/L (ref 0–44)
AST: 18 U/L (ref 15–41)
Albumin: 3.9 g/dL (ref 3.5–5.0)
Alkaline Phosphatase: 55 U/L (ref 38–126)
Anion gap: 7 (ref 5–15)
BUN: 17 mg/dL (ref 8–23)
CO2: 25 mmol/L (ref 22–32)
Calcium: 9.5 mg/dL (ref 8.9–10.3)
Chloride: 106 mmol/L (ref 98–111)
Creatinine, Ser: 0.64 mg/dL (ref 0.44–1.00)
GFR, Estimated: >60 mL/min (ref >60)
Glucose, Bld: 107 mg/dL — ABNORMAL HIGH (ref 70–99)
Potassium: 3.7 mmol/L (ref 3.5–5.1)
Sodium: 138 mmol/L (ref 135–145)
Total Bilirubin: 0.5 mg/dL (ref 0.3–1.2)
Total Protein: 6.9 g/dL (ref 6.5–8.1)

## 2022-11-04 LAB — URINALYSIS, ROUTINE W REFLEX MICROSCOPIC
Bacteria, UA: NONE SEEN
Bilirubin Urine: NEGATIVE
Glucose, UA: NEGATIVE mg/dL
Hgb urine dipstick: NEGATIVE
Ketones, ur: NEGATIVE mg/dL
Nitrite: NEGATIVE
Protein, ur: NEGATIVE mg/dL
Specific Gravity, Urine: 1.02 (ref 1.005–1.030)
pH: 5 (ref 5.0–8.0)

## 2022-11-04 LAB — CBC WITH DIFFERENTIAL/PLATELET
Abs Immature Granulocytes: 0.03 10*3/uL (ref 0.00–0.07)
Basophils Absolute: 0 10*3/uL (ref 0.0–0.1)
Basophils Relative: 1 %
Eosinophils Absolute: 0.3 10*3/uL (ref 0.0–0.5)
Eosinophils Relative: 5 %
HCT: 38.3 % (ref 36.0–46.0)
Hemoglobin: 12.7 g/dL (ref 12.0–15.0)
Immature Granulocytes: 1 %
Lymphocytes Relative: 24 %
Lymphs Abs: 1.6 10*3/uL (ref 0.7–4.0)
MCH: 30.7 pg (ref 26.0–34.0)
MCHC: 33.2 g/dL (ref 30.0–36.0)
MCV: 92.5 fL (ref 80.0–100.0)
Monocytes Absolute: 0.6 10*3/uL (ref 0.1–1.0)
Monocytes Relative: 10 %
Neutro Abs: 4 10*3/uL (ref 1.7–7.7)
Neutrophils Relative %: 59 %
Platelets: 143 10*3/uL — ABNORMAL LOW (ref 150–400)
RBC: 4.14 MIL/uL (ref 3.87–5.11)
RDW: 12.5 % (ref 11.5–15.5)
WBC: 6.5 10*3/uL (ref 4.0–10.5)
nRBC: 0 % (ref 0.0–0.2)

## 2022-11-04 LAB — PROTIME-INR
INR: 0.9 (ref 0.8–1.2)
Prothrombin Time: 12.4 seconds (ref 11.4–15.2)

## 2022-11-04 LAB — LACTIC ACID, PLASMA: Lactic Acid, Venous: 1.6 mmol/L (ref 0.5–1.9)

## 2022-11-04 NOTE — ED Triage Notes (Addendum)
Pt comes from home via POV c/o leg pain. Pt states she has cellulitis on right leg. Pt states she went to urgent care for same thing and they told her to come here. Area on right leg is red and warm to touch. Right leg is swollen. NAD at this time. Denies CP, SOB

## 2022-11-05 MED ORDER — DOXYCYCLINE HYCLATE 100 MG PO CAPS: 100.0000 mg | ORAL_CAPSULE | Freq: Two times a day (BID) | ORAL | 0 refills | Status: AC

## 2022-11-05 MED ORDER — CEPHALEXIN 500 MG PO CAPS
500.0000 mg | ORAL_CAPSULE | Freq: Once | ORAL | Status: AC
  Administered 2022-11-05: 500 mg via ORAL
  Filled 2022-11-05: qty 1

## 2022-11-05 MED ORDER — DOXYCYCLINE HYCLATE 100 MG PO TABS
100.0000 mg | ORAL_TABLET | Freq: Once | ORAL | Status: AC
  Administered 2022-11-05: 100 mg via ORAL
  Filled 2022-11-05: qty 1

## 2022-11-05 MED ORDER — CEFADROXIL 500 MG PO CAPS: 500.0000 mg | ORAL_CAPSULE | Freq: Two times a day (BID) | ORAL | 0 refills | Status: AC

## 2022-11-05 NOTE — Discharge Instructions (Addendum)
As we discussed, your evaluation was generally reassuring.  We believe that we can get you started on antibiotics for your right lower leg infection (cellulitis).  Please take the full course of antibiotics unless your doctor or someone else specifically tells you otherwise.  You may have some upset stomach as a result of the antibiotics, but it should help improve your leg.  In the meantime, keep your leg elevated when possible and continue keeping the wound clean and dry and using antibiotic ointment like you have been doing.  Please follow-up with your primary care doctor on Monday morning with a phone call and let them know that you would like the next available follow-up appointment to follow-up from your emergency department visit and to have a wound recheck for your right lower leg cellulitis.  Continue taking your regular medications.  Return to the emergency department if you develop new or worsening symptoms that concern you.

## 2022-11-05 NOTE — ED Provider Notes (Signed)
[facility] Provider Note    Event Date/Time   First MD Initiated Contact with Patient 11/05/22 0006     (approximate)   History   Leg Pain   HPI  Marie Alvarez is a 88 y.o. female who presents for evaluation of possible infection on her right lower leg.  She is embarrassed" mad at myself" because she injured her right lower leg by scraping it about 6 weeks ago and has been treating herself but has not sought any evaluation even though it has gradually gotten more red and swollen in the area.  She said that now most of her lower leg is swollen.  She finally went to an urgent care today and they told her to come to the emergency department.  It is a little bit painful but not too much and she is able to ambulate without any difficulty.  She has been keeping it clean and using triple antibiotic ointment on it and keeping it covered although she tries to let it "air out" sometimes at night.  She acknowledges that she should have gone to the doctor before now.  She has a primary care doctor that she can see.  She has had no fever, chills, nausea, vomiting, chest pain, shortness of breath.     Physical Exam   Triage Vital Signs: ED Triage Vitals  Enc Vitals Group     BP 11/04/22 1933 (!) 186/65     Pulse Rate 11/04/22 1933 94     Resp 11/04/22 1933 18     Temp 11/04/22 1933 98.1 F (36.7 C)     Temp Source 11/04/22 1933 Oral     SpO2 11/04/22 1933 99 %     Weight 11/04/22 1934 61.2 kg (135 lb)     Height 11/04/22 1934 1.6 m ('5\' 3"'$ )     Head Circumference --      Peak Flow --      Pain Score 11/04/22 1933 4     Pain Loc --      Pain Edu? --      Excl. in [place]? --     Most recent vital signs: Vitals:   11/04/22 1933 11/05/22 0028  BP: (!) 186/65 (!) 176/64  Pulse: 94 75  Resp: 18 16  Temp: 98.1 F (36.7 C) 98 F (36.7 C)  SpO2: 99% 98%     General: Awake, no distress.  CV:  Good peripheral perfusion.  Resp:  Normal effort.  Abd:  No  distention.  Other:  The patient has a large area of erythema with some excoriated or somewhat peeling skin on the anterior lower leg.  There is some generalized skin thickening and some general edema of the lower leg that stops before the knee.  She has no pain or tenderness along the venous distribution.  This appears consistent with a superficial cellulitis that has been somewhat chronic over more than a month.     ED Results / Procedures / Treatments   Labs (all labs ordered are listed, but only abnormal results are displayed) Labs Reviewed  COMPREHENSIVE METABOLIC PANEL - Abnormal; Notable for the following components:      Result Value   Glucose, Bld 107 (*)    All other components within normal limits  CBC WITH DIFFERENTIAL/PLATELET - Abnormal; Notable for the following components:   Platelets 143 (*)    All other components within normal limits  URINALYSIS, ROUTINE W REFLEX MICROSCOPIC - Abnormal; Notable for the  following components:   Color, Urine YELLOW (*)    APPearance CLEAR (*)    Leukocytes,Ua TRACE (*)    All other components within normal limits  CULTURE, BLOOD (ROUTINE X 2)  LACTIC ACID, PLASMA  PROTIME-INR     RADIOLOGY Chest x-ray was ordered in triage.  I viewed and interpreted the two-view chest x-ray and I see no evidence of pneumonia.  I also read the radiologist's report, which confirmed no acute findings.    PROCEDURES:  Critical Care performed: No  Procedures   MEDICATIONS ORDERED IN ED: Medications  cephALEXin (KEFLEX) capsule 500 mg (500 mg Oral Given 11/05/22 0119)  doxycycline (VIBRA-TABS) tablet 100 mg (100 mg Oral Given 11/05/22 0119)     IMPRESSION / MDM / ASSESSMENT AND PLAN / ED COURSE  I reviewed the triage vital signs and the nursing notes.                              Differential diagnosis includes, but is not limited to, cellulitis, DVT, sepsis.  Patient's presentation is most consistent with acute presentation with  potential threat to life or bodily function.  Labs/studies ordered include CBC with differential, CMP, urinalysis, lactic acid, pro time-INR, blood sugar.  A chest x-ray was also ordered in triage.  See photo above for details.  Although the patient seems to have an obvious skin infection, she does not meet any SIRS criteria, which is reassuring.  Her lactic acid is normal, she has no leukocytosis, and her metabolic panel is within normal limits.  Given that she does not meet even a single SIRS criterion, she does not meet criteria for dalbavancin.  She has not been on antibiotics previously, so I will treat her empirically for both strep and staph coverage with a first dose of Keflex 500 mg by mouth and doxycycline 100 mg by mouth in the emergency department.  I wrote her prescriptions for cefadroxil (similar coverage and easier dosing schedule than cephalexin ) and doxycycline for a 10-day course of both.  There is no indication that the patient's symptoms are the result of a DVT.  I talked with her about the benefit or lack thereof and ultrasound and she agrees with the plan for wound care and antibiotics and follow-up.  I gave her information about the wound care clinic and encouraged her to follow-up and return immediately if she develops any new or worsening symptoms.  She agrees with the plan.      FINAL CLINICAL IMPRESSION(S) / ED DIAGNOSES   Final diagnoses:  Cellulitis of right lower extremity     Rx / DC Orders   ED Discharge Orders          Ordered    cefadroxil (DURICEF) 500 MG capsule  2 times daily        11/05/22 0057    doxycycline (VIBRAMYCIN) 100 MG capsule  2 times daily        11/05/22 0057             Note:  This document was prepared using Dragon voice recognition software and may include unintentional dictation errors.   Hinda Kehr, MD 11/05/22 [phone]

## 2022-11-05 NOTE — ED Notes (Signed)
Lab called about blood cultures. 1st set is down there. I told him to hold them and if we needed the second set we would call him.

## 2022-11-09 LAB — CULTURE, BLOOD (ROUTINE X 2)
Culture: NO GROWTH
Special Requests: ADEQUATE

## 2023-01-12 ENCOUNTER — Other Ambulatory Visit: Payer: Self-pay | Admitting: Internal Medicine

## 2023-01-12 DIAGNOSIS — Z1231 Encounter for screening mammogram for malignant neoplasm of breast: Secondary | ICD-10-CM

## 2023-01-27 ENCOUNTER — Telehealth: Payer: Self-pay | Admitting: Hematology and Oncology

## 2023-01-27 NOTE — Telephone Encounter (Signed)
Rescheduled appointment per provider PAL. Left voicemail.

## 2023-02-02 ENCOUNTER — Ambulatory Visit: Payer: Medicare HMO | Admitting: Hematology and Oncology

## 2023-02-02 NOTE — Progress Notes (Signed)
Patient Care Team: Crist Infante, MD as PCP - General (Internal Medicine) Nicholas Lose, MD as Consulting Physician (Hematology and Oncology) Delice Bison Charlestine Massed, NP as Nurse Practitioner (Hematology and Oncology) Eppie Gibson, MD as Attending Physician (Radiation Oncology) Fanny Skates, MD as Consulting Physician (General Surgery)  DIAGNOSIS: No diagnosis found.  SUMMARY OF ONCOLOGIC HISTORY: Oncology History  Malignant neoplasm of upper-outer quadrant of left breast in female, estrogen receptor positive ([place])  07/01/2002 Surgery   Left breast lumpectomy, stage 2 invasive ductal carcinoma with DCIS ER positive PR positive HER-2 negative, 3.4 cm tumor   07/01/2002 Pathologic Stage   Stage IIA: T2 N0   08/06/2002 - 11/06/2002 Chemotherapy   Adjuvant chemotherapy with CMF 6   12/06/2002 - 12/31/2002 Radiation Therapy   Adjuvant radiation therapy   01/29/2003 - 01/06/2009 Anti-estrogen oral therapy   Tamoxifen for 1 year then Arimidex 1 mg daily   Malignant neoplasm of upper-inner quadrant of right breast in female, estrogen receptor positive ([place])  2003 Miscellaneous   Left breast lumpectomy, stage 2 invasive ductal carcinoma with DCIS ER positive PR positive HER-2 negative, 3.4 cm tumor, CMF x6 followed by radiation followed by tamoxifen and later anastrozole completed 2010    11/29/2017 Initial Diagnosis   Right breast biopsy 4 o'clock position: Grade 1-2 IDC with DCIS with necrosis, ER 100%, PR 100%, Ki-67 2%, HER-2 negative ratio 1.19; 7 o'clock position fibroadenoma; ultrasound detected 3 o'clock position 2 x 1.5 x 1.7 cm, T1CN0 stage I a clinical stage   01/12/2018 Surgery   Rt Lumpectomy: IDC grade 1 measuring 0.9 cm with ext DCIS, Margins Neg, ER 100%, PR 100%, Ki-67 2%, HER-2 negative ratio 1.19, T1bNx Stage 1A     03/01/2018 - 03/26/2018 Radiation Therapy   Adjuvant radiation therapy   04/2018 -  Anti-estrogen oral therapy   Could not tolerate anastrozole or  letrozole. Tolerating tamoxifen     CHIEF COMPLIANT:  Follow-up of breast cancer on tamoxifen   INTERVAL HISTORY: Marie Alvarez is a 89 y.o. with above-mentioned history of right breast cancer treated with lumpectomy, radiation, and who is currently on tamoxifen therapy. She presents to the clinic for a follow-up.    ALLERGIES:  is allergic to prednisone and sulfamethoxazole.  MEDICATIONS:  Current Outpatient Medications  Medication Sig Dispense Refill   amLODipine (NORVASC) 5 MG tablet Take 5 mg by mouth daily.      cholecalciferol (VITAMIN D) 1000 units tablet Take 1,000 Units by mouth daily.     doxazosin (CARDURA) 4 MG tablet Take 1 tablet (4 mg total) by mouth daily.     fexofenadine (ALLEGRA) 180 MG tablet Take by mouth.     losartan (COZAAR) 100 MG tablet Take 100 mg by mouth daily.     mirtazapine (REMERON) 15 MG tablet Take 15 mg by mouth at bedtime.     Multiple Vitamins-Minerals (PRESERVISION AREDS 2) CAPS Take 1 tablet by mouth 2 (two) times daily.     nortriptyline (PAMELOR) 10 MG capsule Take 3 capsules (30 mg total) by mouth at bedtime.     pantoprazole (PROTONIX) 40 MG tablet Take 40 mg by mouth daily. May take a second 40 mg dose as needed for acid reflux     pravastatin (PRAVACHOL) 40 MG tablet Take 40 mg by mouth daily.     tamoxifen (NOLVADEX) 20 MG tablet Take 1 tablet (20 mg total) by mouth daily. 90 tablet 4   triamcinolone cream (KENALOG) 0.1 % Apply 1 application topically in  the morning and at bedtime.     vitamin B-12 (CYANOCOBALAMIN) 1000 MCG tablet Take 1,000 mcg by mouth daily.     No current facility-administered medications for this visit.    PHYSICAL EXAMINATION: ECOG PERFORMANCE STATUS: {CHL ONC ECOG PS:[phone]}  There were no vitals filed for this visit. There were no vitals filed for this visit.  BREAST:*** No palpable masses or nodules in either right or left breasts. No palpable axillary supraclavicular or infraclavicular adenopathy no  breast tenderness or nipple discharge. (exam performed in the presence of a chaperone)  LABORATORY DATA:  I have reviewed the data as listed    Latest Ref Rng & Units 11/04/2022    7:40 PM 05/28/2020    7:00 PM 06/25/2018   11:46 AM  CMP  Glucose 70 - 99 mg/dL 107  107  83   BUN 8 - 23 mg/dL '17  17  17   '$ Creatinine 0.44 - 1.00 mg/dL 0.64  0.63  0.78   Sodium 135 - 145 mmol/L 138  142  139   Potassium 3.5 - 5.1 mmol/L 3.7  4.1  4.0   Chloride 98 - 111 mmol/L 106  109  105   CO2 22 - 32 mmol/L '25  23  26   '$ Calcium 8.9 - 10.3 mg/dL 9.5  9.2  10.0   Total Protein 6.5 - 8.1 g/dL 6.9   7.4   Total Bilirubin 0.3 - 1.2 mg/dL 0.5   0.5   Alkaline Phos 38 - 126 U/L 55   72   AST 15 - 41 U/L 18   19   ALT 0 - 44 U/L 13   17     Lab Results  Component Value Date   WBC 6.5 11/04/2022   HGB 12.7 11/04/2022   HCT 38.3 11/04/2022   MCV 92.5 11/04/2022   PLT 143 (L) 11/04/2022   NEUTROABS 4.0 11/04/2022    ASSESSMENT & PLAN:  No problem-specific Assessment & Plan notes found for this encounter.    No orders of the defined types were placed in this encounter.  The patient has a good understanding of the overall plan. she agrees with it. she will call with any problems that may develop before the next visit here. Total time spent: 30 mins including face to face time and time spent for planning, charting and co-ordination of care   Suzzette Righter, [place] 02/02/23    I Gardiner Coins am acting as a Education administrator for [employer]  ***

## 2023-02-07 ENCOUNTER — Other Ambulatory Visit: Payer: Self-pay

## 2023-02-07 ENCOUNTER — Inpatient Hospital Stay: Payer: Medicare HMO | Attending: Hematology and Oncology | Admitting: Hematology and Oncology

## 2023-02-07 VITALS — BP 154/73 | HR 108 | Temp 98.1°F | Resp 20 | Wt 138.1 lb

## 2023-02-07 DIAGNOSIS — C50211 Malignant neoplasm of upper-inner quadrant of right female breast: Secondary | ICD-10-CM | POA: Insufficient documentation

## 2023-02-07 DIAGNOSIS — Z170 Estrogen receptor positive status [ER+]: Secondary | ICD-10-CM | POA: Diagnosis not present

## 2023-02-07 DIAGNOSIS — C50412 Malignant neoplasm of upper-outer quadrant of left female breast: Secondary | ICD-10-CM

## 2023-02-07 DIAGNOSIS — Z853 Personal history of malignant neoplasm of breast: Secondary | ICD-10-CM | POA: Insufficient documentation

## 2023-02-07 DIAGNOSIS — Z79899 Other long term (current) drug therapy: Secondary | ICD-10-CM | POA: Diagnosis not present

## 2023-02-07 DIAGNOSIS — Z923 Personal history of irradiation: Secondary | ICD-10-CM | POA: Insufficient documentation

## 2023-02-07 DIAGNOSIS — Z79810 Long term (current) use of selective estrogen receptor modulators (SERMs): Secondary | ICD-10-CM | POA: Insufficient documentation

## 2023-02-07 MED ORDER — TAMOXIFEN CITRATE 20 MG PO TABS: 20.0000 mg | ORAL_TABLET | Freq: Every day | ORAL | 4 refills | Status: DC

## 2023-02-07 NOTE — Assessment & Plan Note (Addendum)
01/12/18: Rt Lumpectomy: IDC grade 1 measuring 0.9 cm with ext DCIS, Margins Neg, ER 100%, PR 100%, Ki-67 2%, HER-2 negative ratio 1.19, T1bNx Stage 1A (07/01/2002:Left breast lumpectomy, stage 2 invasive ductal carcinoma with DCIS ER positive PR positive HER-2 negative, 3.4 cm tumor T2N0 stage IIa, received adjuvant chemo radiation and antiestrogen therapy until 03-05-09)   Adjuvant radiation therapy: 03/01/2018-03/26/2018   Treatment plan: Adjuvant antiestrogen therapy with letrozole 2.5 mg daily started 04/11/2018, switched to anastrozole but she could not tolerate that either.  Switched to tamoxifen   Current Treatment: Tamoxifen. 20 mg daily. Patient is tolerating tamoxifen extremely well without any major problems.   Patient son passed away 03/05/20 from a acute coronary syndrome.     Breast cancer surveillance: 1.  Breast exam 02/07/2023 Benign 2. Mammogram scheduled for 02/22/2023   RTC in 1 year

## 2023-02-22 ENCOUNTER — Ambulatory Visit: Payer: Medicare HMO

## 2023-03-08 ENCOUNTER — Ambulatory Visit
Admission: RE | Admit: 2023-03-08 | Discharge: 2023-03-08 | Disposition: A | Discharge status: Home / Self Care | Payer: Medicare HMO | Source: Ambulatory Visit | Attending: Internal Medicine | Admitting: Internal Medicine

## 2023-03-08 DIAGNOSIS — Z1231 Encounter for screening mammogram for malignant neoplasm of breast: Secondary | ICD-10-CM

## 2023-08-01 ENCOUNTER — Encounter: Payer: Self-pay | Admitting: Podiatry

## 2023-08-01 ENCOUNTER — Ambulatory Visit: Payer: Medicare HMO | Admitting: Podiatry

## 2023-08-01 DIAGNOSIS — R200 Anesthesia of skin: Secondary | ICD-10-CM | POA: Diagnosis not present

## 2023-08-01 NOTE — Progress Notes (Signed)
   Chief Complaint  Patient presents with   Foot Pain    "When I called to make this appointment, my feet were burning but they're better now.  I just want him to check them.  They feel alright, they just feel different." N - burning L - bilateral 1-5 phalanges and metatarsals D - back in July  O - suddenly C - burning, tingling, and numbness, feels like walking on sponges A - none T - none    HPI: 89 y.o. female presenting today for evaluation of 'spongy' sensation to the bilateral forefoot ongoing for few weeks now.  She says that they feel spongy and tingly.  Over the last 2 weeks the sensation has improved somewhat.  Denies any history of injury or change in activity.  Past Medical History:  Diagnosis Date   Arthritis    Breast cancer (HCC) 2018   Right   Cancer (HCC) 2003   BREAST-LEFT   GERD (gastroesophageal reflux disease)    History of radiation therapy 03/01/18- 03/28/18   Right breast treated to 40.05 Gy in 15 fx followed by a boost of 10 Gy in 5 fx   Hx estrogen therapy 02/02/2012   Hyperlipidemia    Hypertension    Osteoporosis    Personal history of chemotherapy    left 03   Personal history of radiation therapy 2019   Scoliosis    Shingles    Suburethral cyst 03/2012    Past Surgical History:  Procedure Laterality Date   APPENDECTOMY  1946   BREAST BIOPSY Left    BREAST LUMPECTOMY Left 2003   BREAST LUMPECTOMY Right 01/12/2018   invasive ductal    BREAST LUMPECTOMY WITH RADIOACTIVE SEED LOCALIZATION Right 01/12/2018   Procedure: RIGHT BREAST LUMPECTOMY WITH RADIOACTIVE SEED LOCALIZATION;  Surgeon: Claud Kelp, MD;  Location: [facility] OR;  Service: General;  Laterality: Right;   BREAST SURGERY  07-2002   LEFT LUMPECTOMY   TONSILECTOMY, ADENOIDECTOMY, BILATERAL MYRINGOTOMY AND TUBES      Allergies  Allergen Reactions   Prednisone Other (See Comments)    "makes me goofy"   Sulfamethoxazole Rash     Physical Exam: General: The patient is alert and  oriented x3 in no acute distress.  Dermatology: Skin is warm, dry and supple bilateral lower extremities.   Vascular: Palpable pedal pulses bilaterally. Capillary refill within normal limits.  No appreciable edema.  No erythema.  Neurological: Grossly intact via light touch  Musculoskeletal Exam: No pedal deformities noted   Assessment/Plan of Care: 1.  Paresthesia with numbness bilateral forefoot/toes  -Patient evaluated.  Physical exam WNL -There has been improvement over the last 2 weeks.  Reassured the patient that there does not appear to be anything significantly concerning.  Hopefully the sensations will resolve over the following few weeks since there has already been some improvement -Recommend good supportive shoes and sandals -Return to clinic as needed      Felecia Shelling, DPM [facility]  Dr. Felecia Shelling, DPM    [address] [place], [place] [address]                Office [phone]  Fax [phone]

## 2023-10-05 ENCOUNTER — Other Ambulatory Visit: Payer: Self-pay | Admitting: *Deleted

## 2023-10-05 MED ORDER — TAMOXIFEN CITRATE 20 MG PO TABS: 20.0000 mg | ORAL_TABLET | Freq: Every day | ORAL | 4 refills | Status: DC

## 2023-11-01 ENCOUNTER — Other Ambulatory Visit: Payer: Self-pay

## 2023-11-01 ENCOUNTER — Emergency Department (HOSPITAL_COMMUNITY): Payer: Medicare HMO

## 2023-11-01 ENCOUNTER — Emergency Department (HOSPITAL_COMMUNITY)
Admission: EM | Admit: 2023-11-01 | Discharge: 2023-11-01 | Disposition: A | Discharge status: Home / Self Care | Payer: Medicare HMO | Attending: Emergency Medicine | Admitting: Emergency Medicine

## 2023-11-01 ENCOUNTER — Encounter (HOSPITAL_COMMUNITY): Payer: Self-pay

## 2023-11-01 DIAGNOSIS — I4719 Other supraventricular tachycardia: Secondary | ICD-10-CM | POA: Insufficient documentation

## 2023-11-01 DIAGNOSIS — Z79899 Other long term (current) drug therapy: Secondary | ICD-10-CM | POA: Diagnosis not present

## 2023-11-01 DIAGNOSIS — Z853 Personal history of malignant neoplasm of breast: Secondary | ICD-10-CM | POA: Diagnosis not present

## 2023-11-01 DIAGNOSIS — R42 Dizziness and giddiness: Secondary | ICD-10-CM | POA: Diagnosis present

## 2023-11-01 DIAGNOSIS — I10 Essential (primary) hypertension: Secondary | ICD-10-CM | POA: Diagnosis not present

## 2023-11-01 HISTORY — DX: COVID-19: U07.1

## 2023-11-01 LAB — CBC WITH DIFFERENTIAL/PLATELET
Abs Immature Granulocytes: 0.02 10*3/uL (ref 0.00–0.07)
Basophils Absolute: 0 10*3/uL (ref 0.0–0.1)
Basophils Relative: 1 %
Eosinophils Absolute: 0.1 10*3/uL (ref 0.0–0.5)
Eosinophils Relative: 2 %
HCT: 40.5 % (ref 36.0–46.0)
Hemoglobin: 12.9 g/dL (ref 12.0–15.0)
Immature Granulocytes: 0 %
Lymphocytes Relative: 18 %
Lymphs Abs: 1.1 10*3/uL (ref 0.7–4.0)
MCH: 30.3 pg (ref 26.0–34.0)
MCHC: 31.9 g/dL (ref 30.0–36.0)
MCV: 95.1 fL (ref 80.0–100.0)
Monocytes Absolute: 0.5 10*3/uL (ref 0.1–1.0)
Monocytes Relative: 8 %
Neutro Abs: 4.7 10*3/uL (ref 1.7–7.7)
Neutrophils Relative %: 71 %
Platelets: 132 10*3/uL — ABNORMAL LOW (ref 150–400)
RBC: 4.26 MIL/uL (ref 3.87–5.11)
RDW: 13.1 % (ref 11.5–15.5)
WBC: 6.5 10*3/uL (ref 4.0–10.5)
nRBC: 0 % (ref 0.0–0.2)

## 2023-11-01 LAB — BASIC METABOLIC PANEL
Anion gap: 9 (ref 5–15)
BUN: 19 mg/dL (ref 8–23)
CO2: 20 mmol/L — ABNORMAL LOW (ref 22–32)
Calcium: 9.1 mg/dL (ref 8.9–10.3)
Chloride: 108 mmol/L (ref 98–111)
Creatinine, Ser: 0.8 mg/dL (ref 0.44–1.00)
GFR, Estimated: >60 mL/min (ref >60)
Glucose, Bld: 144 mg/dL — ABNORMAL HIGH (ref 70–99)
Potassium: 4.4 mmol/L (ref 3.5–5.1)
Sodium: 137 mmol/L (ref 135–145)

## 2023-11-01 LAB — TSH: TSH: 4.119 u[IU]/mL (ref 0.350–4.500)

## 2023-11-01 LAB — TROPONIN I (HIGH SENSITIVITY): Troponin I (High Sensitivity): 11 ng/L (ref <18)

## 2023-11-01 LAB — MAGNESIUM: Magnesium: 2.1 mg/dL (ref 1.7–2.4)

## 2023-11-01 LAB — I-STAT CG4 LACTIC ACID, ED: Lactic Acid, Venous: 1.8 mmol/L (ref 0.5–1.9)

## 2023-11-01 MED ORDER — METOPROLOL SUCCINATE ER 25 MG PO TB24: 25.0000 mg | ORAL_TABLET | Freq: Every day | ORAL | 1 refills | Status: DC

## 2023-11-01 MED ORDER — DILTIAZEM LOAD VIA INFUSION: 10.0000 mg | Freq: Once | INTRAVENOUS | Status: DC

## 2023-11-01 MED ORDER — DILTIAZEM HCL-DEXTROSE 125-5 MG/125ML-% IV SOLN (PREMIX): 5.0000 mg/h | INTRAVENOUS | Status: DC

## 2023-11-01 MED ORDER — ADENOSINE 6 MG/2ML IV SOLN
6.0000 mg | Freq: Once | INTRAVENOUS | Status: AC
  Administered 2023-11-01: 6 mg via INTRAVENOUS
  Filled 2023-11-01: qty 2

## 2023-11-01 MED ORDER — SODIUM CHLORIDE 0.9 % IV BOLUS
500.0000 mL | Freq: Once | INTRAVENOUS | Status: AC
  Administered 2023-11-01: 500 mL via INTRAVENOUS

## 2023-11-01 NOTE — Consult Note (Signed)
Cardiology Consultation   Patient ID: Marie Alvarez MRN: [id number]; DOB: 1931/11/13  Admit date: 11/01/2023 Date of Consult: 11/01/2023  PCP:  Marie Ran, MD   [facility] Providers Cardiologist:  None       History of Present Illness:   Marie Alvarez is a 89 y.o. female with a hx of arthritis, breast cancer, GERD, HTN, HLD who presented to the ED today with c/o feeling lightheaded. She has had low blood pressures at home over the past few weeks. She was told to stop her Losartan and Cardura when she called primary care this week. She was being seen in the primary care office today and was found to be tachycardic. She was sent to the ED and found to have wide complex tachycardia at 150 bpm. Adenosine 6 mg given x 1. She had several sinus beats then what appeared to be atrial tachycardia with more narrow QRS complex. Several minutes later she converted to sinus rhythm. She endorses several episodes of dizziness with low blood pressures over the past few weeks but has not noticed her heart racing.    Past Medical History:  Diagnosis Date   Arthritis    Breast cancer (HCC) 2018   Right   Cancer (HCC) 2003   BREAST-LEFT   COVID-19    GERD (gastroesophageal reflux disease)    History of radiation therapy 03/01/18- 03/28/18   Right breast treated to 40.05 Gy in 15 fx followed by a boost of 10 Gy in 5 fx   Hx estrogen therapy 02/02/2012   Hyperlipidemia    Hypertension    Osteoporosis    Personal history of chemotherapy    left 03   Personal history of radiation therapy 2019   Scoliosis    Shingles    Suburethral cyst 03/2012    Past Surgical History:  Procedure Laterality Date   APPENDECTOMY  1946   BREAST BIOPSY Left    BREAST LUMPECTOMY Left 2003   BREAST LUMPECTOMY Right 01/12/2018   invasive ductal    BREAST LUMPECTOMY WITH RADIOACTIVE SEED LOCALIZATION Right 01/12/2018   Procedure: RIGHT BREAST LUMPECTOMY WITH RADIOACTIVE SEED LOCALIZATION;  Surgeon: Marie Kelp, MD;  Location: [facility] OR;  Service: General;  Laterality: Right;   BREAST SURGERY  07-2002   LEFT LUMPECTOMY   TONSILECTOMY, ADENOIDECTOMY, BILATERAL MYRINGOTOMY AND TUBES       Home Medications:  Prior to Admission medications   Medication Sig Start Date End Date Taking? Authorizing Provider  acetaminophen (TYLENOL) 650 MG CR tablet Take 650 mg by mouth as needed for pain.   Yes [provider]  amLODipine (NORVASC) 5 MG tablet Take 5 mg by mouth daily.   Yes [provider]  cholecalciferol (VITAMIN D) 1000 units tablet Take 1,000 Units by mouth at bedtime.   Yes [provider]  estradiol (ESTRACE) 0.1 MG/GM vaginal cream Place 1 Applicatorful vaginally 3 (three) times a week. Monday, Wednesday, Friday   Yes [provider]  fexofenadine (ALLEGRA) 180 MG tablet Take 180 mg by mouth daily.   Yes [provider]  Multiple Vitamins-Minerals (PRESERVISION AREDS 2) CAPS Take 1 tablet by mouth 2 (two) times daily. 04/29/20  Yes [provider]  nortriptyline (PAMELOR) 10 MG capsule Take 3 capsules (30 mg total) by mouth at bedtime. 02/02/22  Yes Marie Croissant, MD  pantoprazole (PROTONIX) 40 MG tablet Take 40 mg by mouth daily. May take a second 40 mg dose as needed for acid reflux  Yes [provider]  pravastatin (PRAVACHOL) 40 MG tablet Take 40 mg by mouth at bedtime.   Yes [provider]  tamoxifen (NOLVADEX) 20 MG tablet Take 1 tablet (20 mg total) by mouth daily. Patient taking differently: Take 20 mg by mouth at bedtime. 10/05/23  Yes Marie Croissant, MD  triamcinolone cream (KENALOG) 0.1 % Apply 1 application  topically as needed (itching). 05/05/20  Yes [provider]  vitamin B-12 (CYANOCOBALAMIN) 1000 MCG tablet Take 1,000 mcg by mouth at bedtime.   Yes [provider]  doxazosin (CARDURA) 4 MG tablet Take 1 tablet (4 mg total) by mouth daily. Patient not taking: Reported on 11/01/2023 12/26/18    Marie Croissant, MD  losartan (COZAAR) 100 MG tablet Take 100 mg by mouth daily. Patient not taking: Reported on 11/01/2023 04/29/20   [provider]    Inpatient Medications: Scheduled Meds:  Continuous Infusions:  PRN Meds:   Allergies:    Allergies  Allergen Reactions   Amoxicillin-Pot Clavulanate Other (See Comments)    Gi intolerance    Letrozole Other (See Comments)    Aches and pains    Lisinopril Other (See Comments)    "Made me feel bad"    Prednisone Other (See Comments)    "makes me goofy"   Sulfamethoxazole Rash    Social History:   Social History   Socioeconomic History   Marital status: Widowed    Spouse name: Not on file   Number of children: 2   Years of education: Not on file   Highest education level: Not on file  Occupational History   Not on file  Tobacco Use   Smoking status: Never   Smokeless tobacco: Never  Vaping Use   Vaping status: Never Used  Substance and Sexual Activity   Alcohol use: No    Alcohol/week: 0.0 standard drinks of alcohol   Drug use: No   Sexual activity: Not Currently    Birth control/protection: Post-menopausal    Comment: 1st intercourse 78 yo--1 partner  Other Topics Concern   Not on file  Social History Narrative   Not on file   Social Determinants of Health   Financial Resource Strain: Not on file  Food Insecurity: Not on file  Transportation Needs: Not on file  Physical Activity: Not on file  Stress: Not on file  Social Connections: Not on file  Intimate Partner Violence: Not on file    Family History:   Family History  Problem Relation Age of Onset   Heart disease Mother        Angina   Heart disease Father        Unknown   Breast cancer Neg Hx      ROS:  Please see the history of present illness.  All other ROS reviewed and negative.     Physical Exam/Data:   Vitals:   11/01/23 1000 11/01/23 1015 11/01/23 1030 11/01/23 1045  BP: 122/74 112/72 110/73 (!) 140/69  Pulse: (!) 149  (!) 147 (!) 146 (!) 114  Resp: (!) 28 (!) 21 (!) 28   Temp:      TempSrc:      SpO2: 100% 100% 100% 100%  Weight:      Height:       No intake or output data in the 24 hours ending 11/01/23 1059    11/01/2023    9:56 AM 02/07/2023   10:44 AM 11/04/2022    7:34 PM  Last 3 Weights  Weight (  lbs) 138 lb 0.1 oz 138 lb 1.6 oz 135 lb  Weight (kg) 62.6 kg 62.642 kg 61.236 kg     Body mass index is 24.45 kg/m.  General:  Well nourished, well developed, in no acute distress HEENT: normal Neck: no JVD Vascular: No carotid bruits; Distal pulses 2+ bilaterally Cardiac:  normal S1, S2; RRR; no murmur  Lungs:  clear to auscultation bilaterally, no wheezing, rhonchi or rales  Abd: soft, nontender, no hepatomegaly  Ext: no edema Musculoskeletal:  No deformities, BUE and BLE strength normal and equal Skin: warm and dry  Neuro:  CNs 2-12 intact, no focal abnormalities noted Psych:  Normal affect   EKG:  The EKG was personally reviewed and demonstrates:  wide complex tachycardia Telemetry:  Telemetry was personally reviewed and demonstrates:  wide complex tachycardia, converted to sinus post adenosine. Likely atrial tachycardia vs atrial flutter.   Relevant CV Studies:   Laboratory Data:  High Sensitivity Troponin:  No results for input(s): "TROPONINIHS" in the last 720 hours.   ChemistryNo results for input(s): "NA", "K", "CL", "CO2", "GLUCOSE", "BUN", "CREATININE", "CALCIUM", "MG", "GFRNONAA", "GFRAA", "ANIONGAP" in the last 168 hours.  No results for input(s): "PROT", "ALBUMIN", "AST", "ALT", "ALKPHOS", "BILITOT" in the last 168 hours. Lipids No results for input(s): "CHOL", "TRIG", "HDL", "LABVLDL", "LDLCALC", "CHOLHDL" in the last 168 hours.  HematologyNo results for input(s): "WBC", "RBC", "HGB", "HCT", "MCV", "MCH", "MCHC", "RDW", "PLT" in the last 168 hours. Thyroid No results for input(s): "TSH", "FREET4" in the last 168 hours.  BNPNo results for input(s): "BNP", "PROBNP" in the  last 168 hours.  DDimer No results for input(s): "DDIMER" in the last 168 hours.   Radiology/Studies:  No results found.   Assessment and Plan:   Atrial tachycardia vs atrial flutter: She converted to sinus with IV adenosine. Labs are pending. Would start Toprol 25 mg daily. Monitor on telemetry for several hours. If she remains in sinus, ok to discharge home today. We will plan close follow up in our EP clinic.    For questions or updates, please contact [facility] Please consult [email or web address] for contact info under    Signed, Verne Carrow, MD  11/01/2023 10:59 AM

## 2023-11-01 NOTE — ED Provider Notes (Signed)
EMERGENCY DEPARTMENT AT [facility] Provider Note   CSN: [id number] Arrival date & time: 11/01/23  7829     History  Chief Complaint  Patient presents with   Tachycardia    Marie Alvarez is a 89 y.o. female.  She has a history of breast cancer hypertension hyperlipidemia.  She has had episodes of lightheadedness in which she checked her blood pressure she is found to be below 100.  She talked to her doctor who took her off of 2 blood pressure medicines yesterday.  She was in the office today for evaluation and was orthostatic and tachycardic.  They called EMS to bring her over to the emergency department.  She continues to endorse feeling lightheaded like she might pass out.  No chest pain or shortness of breath no vomiting or diarrhea or urinary symptoms.  She does endorse a little achiness in her left arm which she attributes to bursitis.  The history is provided by the patient.  Dizziness Quality:  Lightheadedness Severity:  Moderate Onset quality:  Gradual Duration:  2 weeks Timing:  Intermittent Progression:  Unchanged Chronicity:  New Relieved by:  Lying down Worsened by:  Nothing Ineffective treatments:  None tried Associated symptoms: no chest pain, no diarrhea, no headaches, no nausea, no palpitations, no shortness of breath, no syncope, no vision changes and no vomiting        Home Medications Prior to Admission medications   Medication Sig Start Date End Date Taking? Authorizing Provider  amLODipine (NORVASC) 5 MG tablet Take 5 mg by mouth daily.     [provider]  cholecalciferol (VITAMIN D) 1000 units tablet Take 1,000 Units by mouth daily.    [provider]  doxazosin (CARDURA) 4 MG tablet Take 1 tablet (4 mg total) by mouth daily. 12/26/18   Serena Croissant, MD  fexofenadine (ALLEGRA) 180 MG tablet Take by mouth.    [provider]  losartan (COZAAR) 100 MG tablet Take 100 mg by mouth daily. 04/29/20   [provider]  Multiple Vitamins-Minerals (PRESERVISION AREDS 2) CAPS Take 1 tablet by mouth 2 (two) times daily. 04/29/20   [provider]  nortriptyline (PAMELOR) 10 MG capsule Take 3 capsules (30 mg total) by mouth at bedtime. 02/02/22   Serena Croissant, MD  pantoprazole (PROTONIX) 40 MG tablet Take 40 mg by mouth daily. May take a second 40 mg dose as needed for acid reflux    [provider]  pravastatin (PRAVACHOL) 40 MG tablet Take 40 mg by mouth daily.    [provider]  tamoxifen (NOLVADEX) 20 MG tablet Take 1 tablet (20 mg total) by mouth daily. 10/05/23   Serena Croissant, MD  triamcinolone cream (KENALOG) 0.1 % Apply 1 application topically in the morning and at bedtime. 05/05/20   [provider]  vitamin B-12 (CYANOCOBALAMIN) 1000 MCG tablet Take 1,000 mcg by mouth daily.    [provider]      Allergies    Prednisone and Sulfamethoxazole    Review of Systems   Review of Systems  Constitutional:  Negative for fever.  Eyes:  Negative for visual disturbance.  Respiratory:  Negative for shortness of breath.   Cardiovascular:  Negative for chest pain, palpitations and syncope.  Gastrointestinal:  Negative for diarrhea, nausea and vomiting.  Neurological:  Positive for dizziness and light-headedness. Negative for headaches.    Physical Exam Updated Vital Signs BP 112/88   Pulse (!) 151   Temp 98.1  F (36.7 C) (Oral)   Resp (!) 22   Ht 5\' 3"  (1.6 m)   Wt 62.6 kg   SpO2 100%   BMI 24.45 kg/m  Physical Exam Vitals and nursing note reviewed.  Constitutional:      General: She is not in acute distress.    Appearance: Normal appearance. She is well-developed.  HENT:     Head: Normocephalic and atraumatic.  Eyes:     Conjunctiva/sclera: Conjunctivae normal.  Cardiovascular:     Rate and Rhythm: Regular rhythm. Tachycardia present.     Heart sounds: No murmur heard. Pulmonary:     Effort: Pulmonary effort is normal. No  respiratory distress.     Breath sounds: Normal breath sounds.  Abdominal:     Palpations: Abdomen is soft.     Tenderness: There is no abdominal tenderness. There is no guarding or rebound.  Musculoskeletal:        General: No deformity.     Cervical back: Neck supple.  Skin:    General: Skin is warm and dry.     Capillary Refill: Capillary refill takes less than 2 seconds.  Neurological:     General: No focal deficit present.     Mental Status: She is alert.     ED Results / Procedures / Treatments   Labs (all labs ordered are listed, but only abnormal results are displayed) Labs Reviewed  BASIC METABOLIC PANEL - Abnormal; Notable for the following components:      Result Value   CO2 20 (*)    Glucose, Bld 144 (*)    All other components within normal limits  CBC WITH DIFFERENTIAL/PLATELET - Abnormal; Notable for the following components:   Platelets 132 (*)    All other components within normal limits  MAGNESIUM  TSH  URINALYSIS, ROUTINE W REFLEX MICROSCOPIC  I-STAT CHEM 8, ED  I-STAT CG4 LACTIC ACID, ED  TROPONIN I (HIGH SENSITIVITY)    EKG EKG Interpretation Date/Time:  Wednesday November 01 2023 09:52:42 EST Ventricular Rate:  151 PR Interval:    QRS Duration:  149 QT Interval:  335 QTC Calculation: 531 R Axis:   177  Text Interpretation: Extreme tachycardia with wide complex, no further rhythm analysis attempted Confirmed by Meridee Score [phone]) on 11/01/2023 10:00:45 AM  Radiology DG Chest Port 1 View  Result Date: 11/01/2023 CLINICAL DATA:  Weakness. EXAM: PORTABLE CHEST 1 VIEW COMPARISON:  Chest radiograph dated November 04, 2022.  Hello FINDINGS: The heart size and mediastinal contours are within normal limits. Coarsened appearance of the interstitium, likely chronic. No definite focal consolidation. No sizable pleural effusion or pneumothorax. No acute osseous abnormality. IMPRESSION: No acute cardiopulmonary findings. Electronically Signed   By:  Hart Robinsons M.D.   On: 11/01/2023 11:26    Procedures .Critical Care  Performed by: Terrilee Files, MD Authorized by: Terrilee Files, MD   Critical care provider statement:    Critical care time (minutes):  45   Critical care time was exclusive of:  Separately billable procedures and treating other patients   Critical care was necessary to treat or prevent imminent or life-threatening deterioration of the following conditions:  Cardiac failure   Critical care was time spent personally by me on the following activities:  Development of treatment plan with patient or surrogate, discussions with consultants, evaluation of patient's response to treatment, examination of patient, obtaining history from patient or surrogate, ordering and performing treatments and interventions, ordering and review of laboratory studies, ordering and  review of radiographic studies, pulse oximetry, re-evaluation of patient's condition and review of old charts   I assumed direction of critical care for this patient from another provider in my specialty: no       Medications Ordered in ED Medications  sodium chloride 0.9 % bolus 500 mL (0 mLs Intravenous Stopped 11/01/23 1255)  adenosine (ADENOCARD) 6 MG/2ML injection 6 mg (6 mg Intravenous Given 11/01/23 1031)    ED Course/ Medical Decision Making/ A&P Clinical Course as of 11/01/23 1730  Wed Nov 01, 2023  1011 EKG showing wide-complex tachycardia with a rate of 150.  This is very different from her prior EKGs.  I have put a call into cardiology for review of EKG and recommendations.  She is hemodynamically stable so I do not feel the need to immediately intervene. [MB]  1019 Reviewed with Dr. Clifton James cardiology.  He said this is likely a flutter or SVT.  Recommends trying adenosine to see if we can identify flutter waves.  Cardiology will consult on the patient. [MB]  1026 Chest x-ray without definite infiltrate, possible right upper lobe nodule.   Awaiting radiology reading. [MB]  1040 Adenosine given with brief pause.  Appeared to go to sinus tachycardia and then back up into the 150s again.  No clear flutter waves.  Dr. Clifton James was present for this and is taking the strips to review with EP.  Patient tolerated intervention without any difficulty. [MB]  1108 PT feels this is an atrial tachycardia.  They recommend observing her and if she remains in sinus she can be discharged on Toprol.  Will continue to monitor [MB]  1145 Patient in sinus rhythm.  Will continue to monitor. [MB]  1427 Patient remains in sinus rhythm.  Will discharge on atenolol per cardiology recommendations.  I reviewed this with her and she is comfortable plan for discharge. [MB]    Clinical Course User Index [MB] Terrilee Files, MD                                 Medical Decision Making Amount and/or Complexity of Data Reviewed Labs: ordered. Radiology: ordered.  Risk Prescription drug management.   This patient complains of dizziness lightheadedness rapid heart rate; this involves an extensive number of treatment Options and is a complaint that carries with it a high risk of complications and morbidity. The differential includes arrhythmia, dehydration, hyperthyroidism, metabolic derangement  I ordered, reviewed and interpreted labs, which included CBC with normal white count normal hemoglobin, chemistries with mildly low bicarb elevated glucose, troponin flat, TSH normal I ordered medication IV fluids IV adenosine and reviewed PMP when indicated. I ordered imaging studies which included chest x-ray and I independently    visualized and interpreted imaging which showed no acute findings Previous records obtained and reviewed in epic including PCP notes I consulted cardiology Dr. Clifton James and discussed lab and imaging findings and discussed disposition.  Cardiac monitoring reviewed, atrial tachycardia Social determinants considered, no significant  barriers Critical Interventions: Monitoring initiation of IV adenosine for patient's tacky dysrhythmia.  Patient ultimately broke to sinus tachycardia.  After the interventions stated above, I reevaluated the patient and found patient to be resting more comfortably in sinus rhythm Admission and further testing considered, no further accelerated rhythms.  Will discharge on beta-blocker with close follow-up with cardiology.  Return instructions discussed         Final Clinical Impression(s) / ED Diagnoses  Final diagnoses:  Atrial tachycardia, paroxysmal (HCC)    Rx / DC Orders ED Discharge Orders          Ordered    metoprolol succinate (TOPROL XL) 25 MG 24 hr tablet  Daily        11/01/23 1108              Terrilee Files, MD 11/01/23 1732

## 2023-11-01 NOTE — Discharge Instructions (Signed)
You were seen in the emergency department for evaluation of lightheadedness and rapid heart rate.  Cardiology evaluated you and is recommending starting on a medication which has been sent to your pharmacy.  They are also arranging for you to follow-up in their clinic.  Return to the emergency department if any worsening or concerning symptoms.

## 2023-11-01 NOTE — ED Notes (Signed)
Dr Charm Barges and Dr Clifton James at bedside

## 2023-11-01 NOTE — ED Triage Notes (Signed)
Pt arrived via [facility] from doctor office for SVT. Per EMS, Pt has positive orthostatics at doctor office. Sitting 110/70, standing 70/50. Pt c/o dizziness and heart palpitations. EMS gave NS 200 mL. Per EMS, the staff at dr office noticed pt was having SOB when walking into office, but pt denied feeling SOB. Pt c/o tachycardia since yesterday. Pt has been having int tachycardia since 11/5. Pt is A&Ox4.

## 2023-11-01 NOTE — ED Notes (Signed)
Pt placed on zoll.

## 2023-11-08 ENCOUNTER — Other Ambulatory Visit (INDEPENDENT_AMBULATORY_CARE_PROVIDER_SITE_OTHER): Payer: Medicare HMO

## 2023-11-08 ENCOUNTER — Encounter: Payer: Self-pay | Admitting: Cardiology

## 2023-11-08 ENCOUNTER — Ambulatory Visit: Payer: Medicare HMO | Attending: Cardiology | Admitting: Cardiology

## 2023-11-08 VITALS — BP 148/80 | HR 80 | Ht 63.0 in | Wt 135.8 lb

## 2023-11-08 DIAGNOSIS — R000 Tachycardia, unspecified: Secondary | ICD-10-CM | POA: Diagnosis not present

## 2023-11-08 NOTE — Progress Notes (Signed)
Electrophysiology Office Note:    Date:  11/08/2023   ID:  Marie Alvarez, DOB 1931-02-01, MRN [id number]  [facility] Cardiologist:  None  [facility] Electrophysiologist:  Lanier Prude, MD   Referring MD: Kathleene Hazel*   Chief Complaint: Tachycardia  History of Present Illness:    Marie Alvarez is a 89 year old woman who I am seeing today for an evaluation of tachycardia at the request of Dr. Clifton James.  The patient has a history of hypertension, breast cancer, hyperlipidemia, GERD.  She has received radiation and chemo for her breast cancer.  She was recently in the emergency department on November 20 with a wide-complex tachycardia going about 150 bpm.  Around that time she had had at least 2 blood pressure medications stopped by her primary care physician given low blood pressures at home.  In the emergency department adenosine was given which did not immediately terminate the arrhythmia.  She spontaneously converted back to sinus rhythm in the emergency department prior to discharge.  She presents for follow-up.  Discussed the use of AI scribe software for clinical note transcription with the patient, who gave verbal consent to proceed.  History of Present Illness   Marie Alvarez, with a history of a recent hospital admission for tachycardia, presents with ongoing dizziness. She describes episodes of feeling like she is going to pass out, during which her blood pressure drops below 100. She has not completely lost consciousness during these episodes, but has felt lightheaded enough to sit down. During her hospital admission, her heart rate was noted to be high, around 150 beats per minute. Since her hospital discharge, she has not had any more of these episodes, but she reports feeling "dizzy as heck" today, with a sensation of the room spinning upon getting out of bed. She also describes a constant feeling of being "stocked up like I'm in the mountains," suggesting possible inner  ear issues.               Their past medical, social and family history was reveiwed.   ROS:   Please see the history of present illness.    All other systems reviewed and are negative.  EKGs/Labs/Other Studies Reviewed:    The following studies were reviewed today:  11/01/2023 ECG   EKG Interpretation Date/Time:  Wednesday November 08 2023 10:35:51 EST Ventricular Rate:  80 PR Interval:  152 QRS Duration:  80 QT Interval:  370 QTC Calculation: 426 R Axis:   65  Text Interpretation: Normal sinus rhythm Normal ECG Confirmed by Steffanie Dunn [phone]) on 11/08/2023 10:39:20 AM    Physical Exam:    VS:  BP (!) 148/80 (BP Location: Right Arm, Patient Position: Sitting, Cuff Size: Normal)   Pulse 80   Ht 5\' 3"  (1.6 m)   Wt 135 lb 12.8 oz (61.6 kg)   SpO2 98%   BMI 24.06 kg/m     Wt Readings from Last 3 Encounters:  11/08/23 135 lb 12.8 oz (61.6 kg)  11/01/23 138 lb 0.1 oz (62.6 kg)  02/07/23 138 lb 1.6 oz (62.6 kg)     GEN: elderly, no distress CARD: RRR, No MRG RESP: No IWOB. CTAB       ASSESSMENT AND PLAN:    1. Wide-complex tachycardia       Assessment and Plan    Atrial Tachycardia History of episodes of tachycardia with heart rate up to 150 bpm, associated with pre-syncope. Currently on Metoprolol. No recent episodes since hospitalization. -Continue  Metoprolol 1 tablet daily. -Order echocardiogram to assess heart function and valves. -Apply 1-week Zio heart monitor to capture any episodes of tachycardia.  Vertigo Reports of dizziness and room spinning, especially upon getting up. Likely related to inner ear issue. -Consider evaluation by primary care physician if symptoms persist or worsen.  Follow-up in 4-6 weeks with Rosalita Chessman to review results of echocardiogram and heart monitor.        Signed, Rossie Muskrat. Lalla Brothers, MD, [facility], [facility] 11/08/2023 10:51 AM    Electrophysiology [facility]

## 2023-11-08 NOTE — Patient Instructions (Signed)
Medication Instructions:  Your physician recommends that you continue on your current medications as directed. Please refer to the Current Medication list given to you today.  *If you need a refill on your cardiac medications before your next appointment, please call your pharmacy*  Testing/Procedures: Your physician has requested that you have an echocardiogram. Echocardiography is a painless test that uses sound waves to create images of your heart. It provides your doctor with information about the size and shape of your heart and how well your heart's chambers and valves are working. This procedure takes approximately one hour. There are no restrictions for this procedure. Please do NOT wear cologne, perfume, aftershave, or lotions (deodorant is allowed). Please arrive 15 minutes prior to your appointment time.  Please note: We ask at that you not bring children with you during ultrasound (echo/ vascular) testing. Due to room size and safety concerns, children are not allowed in the ultrasound rooms during exams. Our front office staff cannot provide observation of children in our lobby area while testing is being conducted. An adult accompanying a patient to their appointment will only be allowed in the ultrasound room at the discretion of the ultrasound technician under special circumstances. We apologize for any inconvenience.  Your physician has recommended that you wear an event monitor. Event monitors are medical devices that record the heart's electrical activity. Doctors most often [place] these monitors to diagnose arrhythmias. Arrhythmias are problems with the speed or rhythm of the heartbeat. The monitor is a small, portable device. You can wear one while you do your normal daily activities. This is usually used to diagnose what is causing palpitations/syncope (passing out).  Follow-Up: At [facility], you and your health needs are our priority.  As part of our continuing mission  to provide you with exceptional heart care, we have created designated Provider Care Teams.  These Care Teams include your primary Cardiologist (physician) and Advanced Practice Providers (APPs -  Physician Assistants and Nurse Practitioners) who all work together to provide you with the care you need, when you need it.   Your next appointment:   4-6 weeks  Provider:   Sherie Don, NP    Other Instructions ZIO XT- Long Term Monitor Instructions  Your physician has requested you wear a ZIO patch monitor for 14 days.  This is a single patch monitor. [employer] supplies one patch monitor per enrollment. Additional stickers are not available. Please do not apply patch if you will be having a Nuclear Stress Test,  Echocardiogram, Cardiac CT, MRI, or Chest Xray during the period you would be wearing the  monitor. The patch cannot be worn during these tests. You cannot remove and re-apply the  ZIO XT patch monitor.  Your ZIO patch monitor will be mailed 3 day USPS to your address on file. It may take 3-5 days  to receive your monitor after you have been enrolled.  Once you have received your monitor, please review the enclosed instructions. Your monitor  has already been registered assigning a specific monitor serial # to you.  Billing and Patient Assistance Program Information  We have supplied [employer] with any of your insurance information on file for billing purposes. [employer] offers a sliding scale Patient Assistance Program for patients that do not have  insurance, or whose insurance does not completely cover the cost of the ZIO monitor.  You must apply for the Patient Assistance Program to qualify for this discounted rate.  To apply, please call [employer] at  [phone], select option 4, select option 2, ask to apply for  Patient Assistance Program. Meredeth Ide will ask your household income, and how many people  are in your household. They will quote your out-of-pocket cost based on that  information.  [employer] will also be able to set up a 78-month, interest-free payment plan if needed.  Applying the monitor   Shave hair from upper left chest.  Hold abrader disc by orange tab. Rub abrader in 40 strokes over the upper left chest as  indicated in your monitor instructions.  Clean area with 4 enclosed alcohol pads. Let dry.  Apply patch as indicated in monitor instructions. Patch will be placed under collarbone on left  side of chest with arrow pointing upward.  Rub patch adhesive wings for 2 minutes. Remove white label marked "1". Remove the white  label marked "2". Rub patch adhesive wings for 2 additional minutes.  While looking in a mirror, press and release button in center of patch. A small green light will  flash 3-4 times. This will be your only indicator that the monitor has been turned on.  Do not shower for the first 24 hours. You may shower after the first 24 hours.  Press the button if you feel a symptom. You will hear a small click. Record Date, Time and  Symptom in the Patient Logbook.  When you are ready to remove the patch, follow instructions on the last 2 pages of Patient  Logbook. Stick patch monitor onto the last page of Patient Logbook.  Place Patient Logbook in the blue and white box. Use locking tab on box and tape box closed  securely. The blue and white box has prepaid postage on it. Please place it in the mailbox as  soon as possible. Your physician should have your test results approximately 7 days after the  monitor has been mailed back to [facility].  Call [facility] Customer Care at [phone] if you have questions regarding  your ZIO XT patch monitor. Call them immediately if you see an orange light blinking on your  monitor.  If your monitor falls off in less than 4 days, contact our Monitor department at [phone].  If your monitor becomes loose or falls off after 4 days call [employer] at [phone] for  suggestions on  securing your monitor

## 2023-11-20 ENCOUNTER — Other Ambulatory Visit: Payer: Self-pay

## 2023-11-20 DIAGNOSIS — R42 Dizziness and giddiness: Secondary | ICD-10-CM | POA: Diagnosis not present

## 2023-11-20 DIAGNOSIS — I479 Paroxysmal tachycardia, unspecified: Secondary | ICD-10-CM | POA: Diagnosis not present

## 2023-11-20 DIAGNOSIS — Z79899 Other long term (current) drug therapy: Secondary | ICD-10-CM | POA: Insufficient documentation

## 2023-11-20 DIAGNOSIS — E785 Hyperlipidemia, unspecified: Secondary | ICD-10-CM | POA: Insufficient documentation

## 2023-11-20 DIAGNOSIS — N390 Urinary tract infection, site not specified: Secondary | ICD-10-CM | POA: Insufficient documentation

## 2023-11-20 DIAGNOSIS — R0789 Other chest pain: Secondary | ICD-10-CM | POA: Insufficient documentation

## 2023-11-20 DIAGNOSIS — Z8616 Personal history of COVID-19: Secondary | ICD-10-CM | POA: Insufficient documentation

## 2023-11-20 DIAGNOSIS — R55 Syncope and collapse: Secondary | ICD-10-CM | POA: Diagnosis present

## 2023-11-20 DIAGNOSIS — I10 Essential (primary) hypertension: Secondary | ICD-10-CM | POA: Diagnosis not present

## 2023-11-20 DIAGNOSIS — R778 Other specified abnormalities of plasma proteins: Secondary | ICD-10-CM | POA: Diagnosis not present

## 2023-11-20 DIAGNOSIS — Z853 Personal history of malignant neoplasm of breast: Secondary | ICD-10-CM | POA: Diagnosis not present

## 2023-11-20 LAB — CBC
HCT: 44.2 % (ref 36.0–46.0)
Hemoglobin: 14.9 g/dL (ref 12.0–15.0)
MCH: 31.6 pg (ref 26.0–34.0)
MCHC: 33.7 g/dL (ref 30.0–36.0)
MCV: 93.8 fL (ref 80.0–100.0)
Platelets: 186 10*3/uL (ref 150–400)
RBC: 4.71 MIL/uL (ref 3.87–5.11)
RDW: 12.8 % (ref 11.5–15.5)
WBC: 9.2 10*3/uL (ref 4.0–10.5)
nRBC: 0 % (ref 0.0–0.2)

## 2023-11-20 LAB — BASIC METABOLIC PANEL
Anion gap: 12 (ref 5–15)
BUN: 16 mg/dL (ref 8–23)
CO2: 21 mmol/L — ABNORMAL LOW (ref 22–32)
Calcium: 9.5 mg/dL (ref 8.9–10.3)
Chloride: 102 mmol/L (ref 98–111)
Creatinine, Ser: 1 mg/dL (ref 0.44–1.00)
GFR, Estimated: 54 mL/min — ABNORMAL LOW (ref >60)
Glucose, Bld: 128 mg/dL — ABNORMAL HIGH (ref 70–99)
Potassium: 4 mmol/L (ref 3.5–5.1)
Sodium: 135 mmol/L (ref 135–145)

## 2023-11-20 LAB — TROPONIN I (HIGH SENSITIVITY)
Troponin I (High Sensitivity): 13 ng/L (ref <18)
Troponin I (High Sensitivity): 33 ng/L — ABNORMAL HIGH (ref <18)

## 2023-11-20 NOTE — ED Triage Notes (Signed)
Pt comes via POV from home with c/o near syncopal. Pt got lightheaded and her pulse was elevated. Pt has had BP issues since being in hospital before Thanksgiving.   Pt states no cp. Pt states sob that started today.

## 2023-11-21 ENCOUNTER — Emergency Department: Payer: Medicare HMO

## 2023-11-21 ENCOUNTER — Observation Stay (HOSPITAL_BASED_OUTPATIENT_CLINIC_OR_DEPARTMENT_OTHER)
Admit: 2023-11-21 | Discharge: 2023-11-21 | Disposition: A | Discharge status: Home / Self Care | Payer: Medicare HMO | Attending: Internal Medicine | Admitting: Internal Medicine

## 2023-11-21 ENCOUNTER — Observation Stay
Admission: EM | Admit: 2023-11-21 | Discharge: 2023-11-23 | Disposition: A | Discharge status: Home / Self Care | Payer: Medicare HMO | Attending: Internal Medicine | Admitting: Internal Medicine

## 2023-11-21 DIAGNOSIS — Z170 Estrogen receptor positive status [ER+]: Secondary | ICD-10-CM

## 2023-11-21 DIAGNOSIS — R55 Syncope and collapse: Principal | ICD-10-CM

## 2023-11-21 DIAGNOSIS — R079 Chest pain, unspecified: Principal | ICD-10-CM

## 2023-11-21 DIAGNOSIS — R000 Tachycardia, unspecified: Secondary | ICD-10-CM | POA: Diagnosis present

## 2023-11-21 DIAGNOSIS — C50412 Malignant neoplasm of upper-outer quadrant of left female breast: Secondary | ICD-10-CM

## 2023-11-21 DIAGNOSIS — R42 Dizziness and giddiness: Secondary | ICD-10-CM | POA: Diagnosis not present

## 2023-11-21 DIAGNOSIS — I471 Supraventricular tachycardia, unspecified: Secondary | ICD-10-CM

## 2023-11-21 DIAGNOSIS — R002 Palpitations: Secondary | ICD-10-CM

## 2023-11-21 DIAGNOSIS — I10 Essential (primary) hypertension: Secondary | ICD-10-CM | POA: Diagnosis present

## 2023-11-21 DIAGNOSIS — I479 Paroxysmal tachycardia, unspecified: Secondary | ICD-10-CM | POA: Diagnosis present

## 2023-11-21 DIAGNOSIS — N390 Urinary tract infection, site not specified: Secondary | ICD-10-CM

## 2023-11-21 HISTORY — DX: Tachycardia, unspecified: R00.0

## 2023-11-21 HISTORY — DX: Urinary tract infection, site not specified: N39.0

## 2023-11-21 HISTORY — DX: Dizziness and giddiness: R55

## 2023-11-21 LAB — ECHOCARDIOGRAM COMPLETE
AR max vel: 2.55 cm2
AV Area VTI: 3.04 cm2
AV Area mean vel: 2.64 cm2
AV Mean grad: 1.5 mm[Hg]
AV Peak grad: 3 mm[Hg]
Ao pk vel: 0.86 m/s
Area-P 1/2: 5.38 cm2
MV VTI: 1.47 cm2
S' Lateral: 2.1 cm

## 2023-11-21 LAB — TROPONIN I (HIGH SENSITIVITY): Troponin I (High Sensitivity): 29 ng/L — ABNORMAL HIGH (ref <18)

## 2023-11-21 LAB — URINALYSIS, ROUTINE W REFLEX MICROSCOPIC
Bacteria, UA: NONE SEEN
Bilirubin Urine: NEGATIVE
Glucose, UA: NEGATIVE mg/dL
Ketones, ur: NEGATIVE mg/dL
Nitrite: NEGATIVE
Protein, ur: NEGATIVE mg/dL
Specific Gravity, Urine: 1.024 (ref 1.005–1.030)
WBC, UA: >50 WBC/hpf (ref 0–5)
pH: 5 (ref 5.0–8.0)

## 2023-11-21 LAB — TSH: TSH: 4.229 u[IU]/mL (ref 0.350–4.500)

## 2023-11-21 LAB — MAGNESIUM: Magnesium: 2.3 mg/dL (ref 1.7–2.4)

## 2023-11-21 LAB — D-DIMER, QUANTITATIVE: D-Dimer, Quant: 0.32 ug{FEU}/mL (ref 0.00–0.50)

## 2023-11-21 MED ORDER — ACETAMINOPHEN 325 MG PO TABS: 650.0000 mg | ORAL_TABLET | Freq: Four times a day (QID) | ORAL | Status: DC | PRN

## 2023-11-21 MED ORDER — METOPROLOL SUCCINATE ER 25 MG PO TB24
25.0000 mg | ORAL_TABLET | Freq: Two times a day (BID) | ORAL | Status: DC
  Administered 2023-11-21 – 2023-11-22 (×2): 25 mg via ORAL
  Filled 2023-11-21 (×2): qty 1

## 2023-11-21 MED ORDER — ONDANSETRON HCL 4 MG PO TABS: 4.0000 mg | ORAL_TABLET | Freq: Four times a day (QID) | ORAL | Status: DC | PRN

## 2023-11-21 MED ORDER — ONDANSETRON HCL 4 MG/2ML IJ SOLN: 4.0000 mg | Freq: Four times a day (QID) | INTRAMUSCULAR | Status: DC | PRN

## 2023-11-21 MED ORDER — ACETAMINOPHEN 650 MG RE SUPP: 650.0000 mg | Freq: Four times a day (QID) | RECTAL | Status: DC | PRN

## 2023-11-21 MED ORDER — METOPROLOL SUCCINATE ER 25 MG PO TB24
25.0000 mg | ORAL_TABLET | Freq: Every day | ORAL | Status: DC
  Administered 2023-11-21: 25 mg via ORAL
  Filled 2023-11-21: qty 1

## 2023-11-21 MED ORDER — TAMOXIFEN CITRATE 10 MG PO TABS
20.0000 mg | ORAL_TABLET | Freq: Every day | ORAL | Status: DC
  Administered 2023-11-22 (×2): 20 mg via ORAL
  Filled 2023-11-21 (×3): qty 2

## 2023-11-21 MED ORDER — SODIUM CHLORIDE 0.9% FLUSH
3.0000 mL | Freq: Two times a day (BID) | INTRAVENOUS | Status: DC
  Administered 2023-11-21 – 2023-11-23 (×4): 3 mL via INTRAVENOUS

## 2023-11-21 MED ORDER — ASPIRIN 81 MG PO CHEW
81.0000 mg | CHEWABLE_TABLET | Freq: Every day | ORAL | Status: DC
  Administered 2023-11-22 – 2023-11-23 (×2): 81 mg via ORAL
  Filled 2023-11-21 (×2): qty 1

## 2023-11-21 MED ORDER — PRAVASTATIN SODIUM 20 MG PO TABS
40.0000 mg | ORAL_TABLET | Freq: Every day | ORAL | Status: DC
  Administered 2023-11-21 – 2023-11-22 (×2): 40 mg via ORAL
  Filled 2023-11-21 (×2): qty 2

## 2023-11-21 MED ORDER — NORTRIPTYLINE HCL 10 MG PO CAPS
30.0000 mg | ORAL_CAPSULE | Freq: Every day | ORAL | Status: DC
  Administered 2023-11-22 (×2): 30 mg via ORAL
  Filled 2023-11-21 (×3): qty 3

## 2023-11-21 MED ORDER — ASPIRIN 81 MG PO CHEW
324.0000 mg | CHEWABLE_TABLET | Freq: Once | ORAL | Status: AC
  Administered 2023-11-21: 324 mg via ORAL
  Filled 2023-11-21: qty 4

## 2023-11-21 MED ORDER — ENOXAPARIN SODIUM 40 MG/0.4ML IJ SOSY
40.0000 mg | PREFILLED_SYRINGE | INTRAMUSCULAR | Status: DC
  Administered 2023-11-21 – 2023-11-23 (×3): 40 mg via SUBCUTANEOUS
  Filled 2023-11-21 (×3): qty 0.4

## 2023-11-21 MED ORDER — AMLODIPINE BESYLATE 5 MG PO TABS
5.0000 mg | ORAL_TABLET | Freq: Every day | ORAL | Status: DC
  Administered 2023-11-21 – 2023-11-23 (×3): 5 mg via ORAL
  Filled 2023-11-21 (×3): qty 1

## 2023-11-21 MED ORDER — SODIUM CHLORIDE 0.9 % IV SOLN: 1.0000 g | INTRAVENOUS | Status: DC

## 2023-11-21 MED ORDER — CEFTRIAXONE SODIUM 1 G IJ SOLR
1.0000 g | Freq: Once | INTRAMUSCULAR | Status: AC
  Administered 2023-11-21: 1 g via INTRAVENOUS
  Filled 2023-11-21: qty 10

## 2023-11-21 NOTE — Assessment & Plan Note (Addendum)
Ruled Out - Urine culture grew multiple species.   Rocephin dc'd. Pt denies UTI symptoms

## 2023-11-21 NOTE — Progress Notes (Signed)
  Progress Note   Patient: Marie Alvarez VQQ:[id number] DOB: January 27, 1931 DOA: 11/21/2023     0 DOS: the patient was seen and examined on 11/21/2023    Nonbillable note   Brief hospital course: Marie Alvarez is a 89 y.o. female with medical history significant for HTN, breast cancer s/p chemoradiation who presents with a presyncopal episode as stated with chest pain shortness of breath and palpitations.  Patient was seen in the ED on 11/01/2023 with a wide-complex tachyarrhythmia with rate of 150, which spontaneously converted.  She subsequently followed up with cardiology on 11/27 when she complained of having dizzy spells and feeling like she is about to pass out.  She was diagnosed with atrial tachycardia, placed on metoprolol and recently wore a Zio patch.  Cardiology following.  Although UA showed pyuria patient is asymptomatic and this was not a clean-catch given normal WBC pharmacist recommended discontinuation of ceftriaxone.  Other detailed assessment and plan as outlined in H&P by admitting physician    Physical Exam: Vitals and nursing note reviewed.  Constitutional:      General: She is not in acute distress. HENT:     Head: Normocephalic and atraumatic.  Cardiovascular:     Rate and Rhythm: Normal rate and regular rhythm.     Heart sounds: Normal heart sounds.  Pulmonary:     Effort: Pulmonary effort is normal.     Breath sounds: Normal breath sounds.  Abdominal:     Palpations: Abdomen is soft.     Tenderness: There is no abdominal tenderness.  Neurological:     Mental Status: Mental status is at baseline. Vitals:   11/21/23 0652 11/21/23 0915 11/21/23 0925 11/21/23 1115  BP: 130/88  (!) 145/84 (!) 140/80  Pulse: 94 98 (!) 103 99  Resp: 18 14 16 16   Temp: 98 F (36.7 C)   98 F (36.7 C)  TempSrc: Oral   Oral  SpO2: 98% 100% 100% 100%  Weight:      Height:        Data Reviewed:    Latest Ref Rng & Units 11/20/2023    5:10 PM 11/01/2023   10:08 AM 11/04/2022     7:40 PM  BMP  Glucose 70 - 99 mg/dL 433  295  188   BUN 8 - 23 mg/dL 16  19  17    Creatinine 0.44 - 1.00 mg/dL 4.16  6.06  3.01   Sodium 135 - 145 mmol/L 135  137  138   Potassium 3.5 - 5.1 mmol/L 4.0  4.4  3.7   Chloride 98 - 111 mmol/L 102  108  106   CO2 22 - 32 mmol/L 21  20  25    Calcium 8.9 - 10.3 mg/dL 9.5  9.1  9.5        Latest Ref Rng & Units 11/20/2023    5:10 PM 11/01/2023   10:08 AM 11/04/2022    7:40 PM  CBC  WBC 4.0 - 10.5 K/uL 9.2  6.5  6.5   Hemoglobin 12.0 - 15.0 g/dL 60.1  09.3  23.5   Hematocrit 36.0 - 46.0 % 44.2  40.5  38.3   Platelets 150 - 400 K/uL 186  132  143      Author: Loyce Dys, MD 11/21/2023 2:49 PM  For on call review [email or web address].

## 2023-11-21 NOTE — Consult Note (Signed)
Cardiology Consultation   Patient ID: KEIRSTIN LEACOCK MRN: [id number]; DOB: January 11, 1931  Admit date: 11/21/2023 Date of Consult: 11/21/2023  PCP:  Rodrigo Ran, MD   [facility] Providers Cardiologist:  None  Electrophysiologist:  Lanier Prude, MD       Patient Profile:   Marie Alvarez is a 89 y.o. female with a hx of arthritis, breast cancer, GERD, hypertension, hyperlipidemia who is being seen 11/21/2023 for the evaluation of near syncope at the request of Dr. Para March.  History of Present Illness:   Marie Alvarez patient was recently hospitalized at [facility] on 11/01/2023 when she presented to the emergency department the complaint of feeling lightheaded.  She was found to be hypertensive at home over the past few weeks.  She was told to stop her losartan and Toradol which she called primary care this week.  She was being seen in the primary cardiologist and was found to be tachycardic.  She was sent to the emergency department and found to have a wide-complex tachycardia at 150 bpm.  Adenosine 6 mg given x 1.  She had several sinus beats and what appeared to be atrial tachycardia with more of a narrow QRS complex.  Several minutes later she converted to sinus rhythm.  She had previously endorsed several episodes of dizziness with low blood pressure over the past few weeks but has not noticed her heart racing.  She was started on Toprol 25 mg daily and will have close follow-up with EP.  She was followed by Dr. Lalla Brothers in clinic 11/08/2023 after her recent visit to the emergency department.  To be continued on metoprolol 1 tablet daily.  She was ordered for an echocardiogram to assess heart function and any valvular abnormality.  She was also placed on a ZIO monitor to capture any episodes of tachycardia.  She was return in 4 to 6 weeks.  Patient presented to the [facility] emergency department from home on 11/20/2023, with complaint of near syncopal episode.  She states she got  lightheaded and her pulse was elevated.  Patient stated she had blood pressure issues since being in the hospital before Thanksgiving.  She denies any chest pain but stated that she was having some shortness of breath that started earlier within the day.  She also noted palpitations.  States that her heart rate was in the 150s.Prior to this episode she was in her normal state of health.  She stated that she had overdone all of her activities getting ready for the last 2 days that she had 2 parties to attend today.  Initial vital signs: Blood pressure 131/66, pulse of 111, respiratory 20, temperature 98.0  Pertinent labs: CO2 21, blood glucose 128, GFR 54, high-sensitivity troponin 33 and 29, D-dimer 0.32, TSH 4.229, Mg 2.3, urinalysis concerning for UTI with culture pending  Imaging: Chest x-ray revealed no active disease  Medications administered in the emergency department: Rocephin 1 g IVPB, aspirin 325 mg  Cardiology was consulted for continued concerns of near syncope  Past Medical History:  Diagnosis Date   Arthritis    Breast cancer (HCC) 2018   Right   Cancer (HCC) 2003   BREAST-LEFT   COVID-19    GERD (gastroesophageal reflux disease)    History of radiation therapy 03/01/18- 03/28/18   Right breast treated to 40.05 Gy in 15 fx followed by a boost of 10 Gy in 5 fx   Hx estrogen therapy 02/02/2012   Hyperlipidemia  Hypertension    Osteoporosis    Personal history of chemotherapy    left 03   Personal history of radiation therapy 2019   Scoliosis    Shingles    Suburethral cyst 03/2012    Past Surgical History:  Procedure Laterality Date   APPENDECTOMY  1946   BREAST BIOPSY Left    BREAST LUMPECTOMY Left 2003   BREAST LUMPECTOMY Right 01/12/2018   invasive ductal    BREAST LUMPECTOMY WITH RADIOACTIVE SEED LOCALIZATION Right 01/12/2018   Procedure: RIGHT BREAST LUMPECTOMY WITH RADIOACTIVE SEED LOCALIZATION;  Surgeon: Claud Kelp, MD;  Location: [facility] OR;  Service:  General;  Laterality: Right;   BREAST SURGERY  07-2002   LEFT LUMPECTOMY   TONSILECTOMY, ADENOIDECTOMY, BILATERAL MYRINGOTOMY AND TUBES       Home Medications:  Prior to Admission medications   Medication Sig Start Date End Date Taking? Authorizing Provider  acetaminophen (TYLENOL) 650 MG CR tablet Take 650 mg by mouth as needed for pain.    [provider]  amLODipine (NORVASC) 5 MG tablet Take 5 mg by mouth daily.    [provider]  cholecalciferol (VITAMIN D) 1000 units tablet Take 1,000 Units by mouth at bedtime.    [provider]  doxazosin (CARDURA) 4 MG tablet Take 1 tablet (4 mg total) by mouth daily. Patient not taking: Reported on 11/08/2023 12/26/18   Serena Croissant, MD  estradiol (ESTRACE) 0.1 MG/GM vaginal cream Place 1 Applicatorful vaginally 3 (three) times a week. Monday, Wednesday, Friday    [provider]  fexofenadine (ALLEGRA) 180 MG tablet Take 180 mg by mouth daily.    [provider]  losartan (COZAAR) 100 MG tablet Take 100 mg by mouth daily. Patient not taking: Reported on 11/08/2023 04/29/20   [provider]  metoprolol succinate (TOPROL XL) 25 MG 24 hr tablet Take 1 tablet (25 mg total) by mouth daily. 11/01/23 04/29/24  Arty Baumgartner, NP  Multiple Vitamins-Minerals (PRESERVISION AREDS 2) CAPS Take 1 tablet by mouth 2 (two) times daily. 04/29/20   [provider]  nortriptyline (PAMELOR) 10 MG capsule Take 3 capsules (30 mg total) by mouth at bedtime. 02/02/22   Serena Croissant, MD  pantoprazole (PROTONIX) 40 MG tablet Take 40 mg by mouth daily. May take a second 40 mg dose as needed for acid reflux    [provider]  pravastatin (PRAVACHOL) 40 MG tablet Take 40 mg by mouth at bedtime.    [provider]  tamoxifen (NOLVADEX) 20 MG tablet Take 1 tablet (20 mg total) by mouth daily. Patient taking differently: Take 20 mg by mouth at bedtime. 10/05/23   Serena Croissant, MD  triamcinolone  cream (KENALOG) 0.1 % Apply 1 application  topically as needed (itching). 05/05/20   [provider]  vitamin B-12 (CYANOCOBALAMIN) 1000 MCG tablet Take 1,000 mcg by mouth at bedtime.    [provider]    Inpatient Medications: Scheduled Meds:  amLODipine  5 mg Oral Daily   enoxaparin (LOVENOX) injection  40 mg Subcutaneous Q24H   metoprolol succinate  25 mg Oral Daily   nortriptyline  30 mg Oral QHS   pravastatin  40 mg Oral QHS   sodium chloride flush  3 mL Intravenous Q12H   tamoxifen  20 mg Oral QHS   Continuous Infusions:  cefTRIAXone (ROCEPHIN)  IV     PRN Meds: acetaminophen **OR** acetaminophen, ondansetron **OR** ondansetron (ZOFRAN) IV  Allergies:    Allergies  Allergen Reactions  Amoxicillin-Pot Clavulanate Other (See Comments)    Gi intolerance    Letrozole Other (See Comments)    Aches and pains    Lisinopril Other (See Comments)    "Made me feel bad"    Prednisone Other (See Comments)    "makes me goofy"   Sulfamethoxazole Rash    Social History:   Social History   Socioeconomic History   Marital status: Widowed    Spouse name: Not on file   Number of children: 2   Years of education: Not on file   Highest education level: Not on file  Occupational History   Not on file  Tobacco Use   Smoking status: Never   Smokeless tobacco: Never  Vaping Use   Vaping status: Never Used  Substance and Sexual Activity   Alcohol use: No    Alcohol/week: 0.0 standard drinks of alcohol   Drug use: No   Sexual activity: Not Currently    Birth control/protection: Post-menopausal    Comment: 1st intercourse 88 yo--1 partner  Other Topics Concern   Not on file  Social History Narrative   Not on file   Social Determinants of Health   Financial Resource Strain: Not on file  Food Insecurity: Not on file  Transportation Needs: Not on file  Physical Activity: Not on file  Stress: Not on file  Social Connections: Not on file  Intimate Partner  Violence: Not on file    Family History:    Family History  Problem Relation Age of Onset   Heart disease Mother        Angina   Heart disease Father        Unknown   Breast cancer Neg Hx      ROS:  Please see the history of present illness.  Review of Systems  Constitutional:  Positive for malaise/fatigue.  Respiratory:  Positive for shortness of breath.   Cardiovascular:  Positive for chest pain and palpitations.  Genitourinary:  Positive for frequency.  Neurological:  Positive for weakness.    All other ROS reviewed and negative.     Physical Exam/Data:   Vitals:   11/20/23 2054 11/21/23 0120 11/21/23 0136 11/21/23 0652  BP: (!) 159/80  (!) 139/90 130/88  Pulse: 77  99 94  Resp: 18 17 18 18   Temp: 98.3 F (36.8 C)  98 F (36.7 C) 98 F (36.7 C)  TempSrc: Oral  Axillary Oral  SpO2: 98%  100% 98%  Weight:      Height:        Intake/Output Summary (Last 24 hours) at 11/21/2023 0734 Last data filed at 11/21/2023 0158 Gross per 24 hour  Intake 100 ml  Output --  Net 100 ml      11/20/2023    5:05 PM 11/08/2023   10:28 AM 11/01/2023    9:56 AM  Last 3 Weights  Weight (lbs) 135 lb 135 lb 12.8 oz 138 lb 0.1 oz  Weight (kg) 61.236 kg 61.598 kg 62.6 kg     Body mass index is 23.91 kg/m.  General:  Well nourished, well developed, in no acute distress HEENT: normal Neck: no JVD Vascular: No carotid bruits; Distal pulses 2+ bilaterally Cardiac:  normal S1, S2; RRR; no murmur  Lungs:  clear to auscultation bilaterally, no wheezing, rhonchi or rales  Abd: soft, nontender, no hepatomegaly  Ext: no edema Musculoskeletal:  No deformities, BUE and BLE strength normal and equal Skin: warm and dry  Neuro:  CNs 2-12 intact,  no focal abnormalities noted Psych:  Normal affect   EKG:  The EKG was personally reviewed and demonstrates: Sinus tachycardia with rate of 108 Telemetry:  Telemetry was personally reviewed and demonstrates:  sinus tach with rates on  100  Relevant CV Studies: Echocardiogram ordered and pending  Laboratory Data:  High Sensitivity Troponin:   Recent Labs  Lab 11/01/23 1008 11/20/23 1710 11/20/23 2054 11/21/23 0111  TROPONINIHS 11 13 33* 29*     Chemistry Recent Labs  Lab 11/20/23 1710 11/21/23 0111  NA 135  --   K 4.0  --   CL 102  --   CO2 21*  --   GLUCOSE 128*  --   BUN 16  --   CREATININE 1.00  --   CALCIUM 9.5  --   MG  --  2.3  GFRNONAA 54*  --   ANIONGAP 12  --     No results for input(s): "PROT", "ALBUMIN", "AST", "ALT", "ALKPHOS", "BILITOT" in the last 168 hours. Lipids No results for input(s): "CHOL", "TRIG", "HDL", "LABVLDL", "LDLCALC", "CHOLHDL" in the last 168 hours.  Hematology Recent Labs  Lab 11/20/23 1710  WBC 9.2  RBC 4.71  HGB 14.9  HCT 44.2  MCV 93.8  MCH 31.6  MCHC 33.7  RDW 12.8  PLT 186   Thyroid  Recent Labs  Lab 11/21/23 0111  TSH 4.229    BNPNo results for input(s): "BNP", "PROBNP" in the last 168 hours.  DDimer  Recent Labs  Lab 11/21/23 0200  DDIMER 0.32     Radiology/Studies:  DG Chest Portable 1 View  Result Date: 11/21/2023 CLINICAL DATA:  Chest pain and shortness of breath EXAM: PORTABLE CHEST 1 VIEW COMPARISON:  11/01/2023 FINDINGS: Cardiac shadow is stable. The lungs are well aerated bilaterally. No focal infiltrate or effusion is seen. No bony abnormality is noted. IMPRESSION: No active disease. Electronically Signed   By: Alcide Clever M.D.   On: 11/21/2023 01:54     Assessment and Plan:   Near syncope/postural dizziness -Recent emergency department visit found to be atrial tachycardia versus atrial flutter, received adenosine, Converted to sinus and started on Toprol-XL 25 mg daily  -Recently wore a ZIO XT monitor will request results from ZIO suite -Presented with elevated heart rates in the 150s and palpitations with associated chest pain and shortness of breath -Echocardiogram ordered and pending to evaluate EF and to assess for  structural abnormalities further recommendations to follow -Orthostatic vital signs -Continue on Toprol-XL 25 mg daily, patient states she has not missed any doses until her arrival to the emergency department, may need up titration of beta-blocker therapy -Continue on telemetry monitoring  -She has any events on telemetry may require consultation of EP  Hypertension -Blood pressure 130/88 -Continued on amlodipine 5 mg daily, Toprol-XL 25 mg daily -Vital signs per unit protocol  Mildly elevated high-sensitivity troponin -High-sensitivity troponin 13, 33, and 29 -Presented with chest pain and palpitations -EKG nonacute with sinus tachycardia -No indication for heparin not ACS -Likely demand ischemia from tachycardia  Urinary tract infection -Urinalysis concerning for UTI. -Continued on antibiotics -Urine cultures pending -Continue management per IM  History of breast cancer -Continue tamoxifen -Continued outpatient follow-up with oncology   Risk Assessment/Risk Scores:                For questions or updates, please contact [facility] Please consult [email or web address] for contact info under    Signed, Forrestine Lecrone, NP  11/21/2023 7:34 AM

## 2023-11-21 NOTE — ED Notes (Signed)
TRANSPORT PAGED @ THIS TIME

## 2023-11-21 NOTE — Progress Notes (Signed)
*  PRELIMINARY RESULTS* Echocardiogram 2D Echocardiogram has been performed.  Carolyne Fiscal 11/21/2023, 10:14 AM

## 2023-11-21 NOTE — H&P (Addendum)
History and Physical    Patient: Marie Alvarez YNW:[id number] DOB: [birth year] DOA: 11/21/2023 DOS: the patient was seen and examined on 11/21/2023 PCP: Rodrigo Ran, MD  Patient coming from: Home  Chief Complaint:  Chief Complaint  Patient presents with   Near Syncope    HPI: Marie Alvarez is a 89 y.o. female with medical history significant for HTN, breast cancer s/p chemoradiation who presents with a presyncopal episode as stated with chest pain shortness of breath and palpitations.  Patient was seen in the ED on 11/01/2023 with a wide-complex tachyarrhythmia with rate of 150, which spontaneously converted.  She subsequently followed up with cardiology on 11/27 when she complained of having dizzy spells and feeling like she is about to pass out.  She was diagnosed with atrial tachycardia, placed on metoprolol and recently wore a Zio patch. Tonight she presents with a severe episode with heart rate up to the 50s.  She denies lower extremity pain or swelling.  She otherwise denies any acute illness.  Has no cough, congestion, nausea or vomiting belly pain, dysuria or change in bowel habits. ED course and data review: Tachycardic to 111 with otherwise normal vitals. Workup notable for the following: Normal CBC Potassium 4, magnesium 2.3,  troponin 33-29, D-dimer negative at 0.32 Urinalysis with large leukocyte esterase EKG, personally viewed and interpreted showing sinus tachycardia at 108 Chest x-ray nonacute Patient given chewable aspirin 324 mg.  Started on ceftriaxone for UTI Hospitalist consulted for admission.   Review of Systems: As mentioned in the history of present illness. All other systems reviewed and are negative.  Past Medical History:  Diagnosis Date   Arthritis    Breast cancer (HCC) 2018   Right   Cancer (HCC) 2003   BREAST-LEFT   COVID-19    GERD (gastroesophageal reflux disease)    History of radiation therapy 03/01/18- 03/28/18   Right breast treated to 40.05 Gy  in 15 fx followed by a boost of 10 Gy in 5 fx   Hx estrogen therapy 02/02/2012   Hyperlipidemia    Hypertension    Osteoporosis    Personal history of chemotherapy    left 03   Personal history of radiation therapy 2019   Scoliosis    Shingles    Suburethral cyst 03/2012   Past Surgical History:  Procedure Laterality Date   APPENDECTOMY  1946   BREAST BIOPSY Left    BREAST LUMPECTOMY Left 2003   BREAST LUMPECTOMY Right 01/12/2018   invasive ductal    BREAST LUMPECTOMY WITH RADIOACTIVE SEED LOCALIZATION Right 01/12/2018   Procedure: RIGHT BREAST LUMPECTOMY WITH RADIOACTIVE SEED LOCALIZATION;  Surgeon: Claud Kelp, MD;  Location: [facility] OR;  Service: General;  Laterality: Right;   BREAST SURGERY  07-2002   LEFT LUMPECTOMY   TONSILECTOMY, ADENOIDECTOMY, BILATERAL MYRINGOTOMY AND TUBES     Social History:  reports that she has never smoked. She has never used smokeless tobacco. She reports that she does not drink alcohol and does not use drugs.  Allergies  Allergen Reactions   Amoxicillin-Pot Clavulanate Other (See Comments)    Gi intolerance    Letrozole Other (See Comments)    Aches and pains    Lisinopril Other (See Comments)    "Made me feel bad"    Prednisone Other (See Comments)    "makes me goofy"   Sulfamethoxazole Rash    Family History  Problem Relation Age of Onset   Heart disease Mother  Angina   Heart disease Father        Unknown   Breast cancer Neg Hx     Prior to Admission medications   Medication Sig Start Date End Date Taking? Authorizing Provider  acetaminophen (TYLENOL) 650 MG CR tablet Take 650 mg by mouth as needed for pain.    [provider]  amLODipine (NORVASC) 5 MG tablet Take 5 mg by mouth daily.    [provider]  cholecalciferol (VITAMIN D) 1000 units tablet Take 1,000 Units by mouth at bedtime.    [provider]  doxazosin (CARDURA) 4 MG tablet Take 1 tablet (4 mg total) by mouth daily. Patient not  taking: Reported on 11/08/2023 12/26/18   Serena Croissant, MD  estradiol (ESTRACE) 0.1 MG/GM vaginal cream Place 1 Applicatorful vaginally 3 (three) times a week. Monday, Wednesday, Friday    [provider]  fexofenadine (ALLEGRA) 180 MG tablet Take 180 mg by mouth daily.    [provider]  losartan (COZAAR) 100 MG tablet Take 100 mg by mouth daily. Patient not taking: Reported on 11/08/2023 04/29/20   [provider]  metoprolol succinate (TOPROL XL) 25 MG 24 hr tablet Take 1 tablet (25 mg total) by mouth daily. 11/01/23 04/29/24  Arty Baumgartner, NP  Multiple Vitamins-Minerals (PRESERVISION AREDS 2) CAPS Take 1 tablet by mouth 2 (two) times daily. 04/29/20   [provider]  nortriptyline (PAMELOR) 10 MG capsule Take 3 capsules (30 mg total) by mouth at bedtime. 02/02/22   Serena Croissant, MD  pantoprazole (PROTONIX) 40 MG tablet Take 40 mg by mouth daily. May take a second 40 mg dose as needed for acid reflux    [provider]  pravastatin (PRAVACHOL) 40 MG tablet Take 40 mg by mouth at bedtime.    [provider]  tamoxifen (NOLVADEX) 20 MG tablet Take 1 tablet (20 mg total) by mouth daily. Patient taking differently: Take 20 mg by mouth at bedtime. 10/05/23   Serena Croissant, MD  triamcinolone cream (KENALOG) 0.1 % Apply 1 application  topically as needed (itching). 05/05/20   [provider]  vitamin B-12 (CYANOCOBALAMIN) 1000 MCG tablet Take 1,000 mcg by mouth at bedtime.    [provider]    Physical Exam: Vitals:   11/20/23 1707 11/20/23 2054 11/21/23 0120 11/21/23 0136  BP: 131/66 (!) 159/80  (!) 139/90  Pulse: (!) 111 77  99  Resp: 20 18 17 18   Temp: 98 F (36.7 C) 98.3 F (36.8 C)  98 F (36.7 C)  TempSrc:  Oral  Axillary  SpO2: 96% 98%  100%  Weight:      Height:       Physical Exam Vitals and nursing note reviewed.  Constitutional:      General: She is not in acute distress. HENT:     Head:  Normocephalic and atraumatic.  Cardiovascular:     Rate and Rhythm: Normal rate and regular rhythm.     Heart sounds: Normal heart sounds.  Pulmonary:     Effort: Pulmonary effort is normal.     Breath sounds: Normal breath sounds.  Abdominal:     Palpations: Abdomen is soft.     Tenderness: There is no abdominal tenderness.  Neurological:     Mental Status: Mental status is at baseline.     Labs on Admission: I have personally reviewed following labs and imaging studies  CBC: Recent Labs  Lab 11/20/23 1710  WBC 9.2  HGB 14.9  HCT 44.2  MCV 93.8  PLT 186   Basic Metabolic Panel: Recent Labs  Lab 11/20/23 1710 11/21/23 0111  NA 135  --   K 4.0  --   CL 102  --   CO2 21*  --   GLUCOSE 128*  --   BUN 16  --   CREATININE 1.00  --   CALCIUM 9.5  --   MG  --  2.3   GFR: Estimated Creatinine Clearance: 30.9 mL/min (by C-G formula based on SCr of 1 mg/dL). Liver Function Tests: No results for input(s): "AST", "ALT", "ALKPHOS", "BILITOT", "PROT", "ALBUMIN" in the last 168 hours. No results for input(s): "LIPASE", "AMYLASE" in the last 168 hours. No results for input(s): "AMMONIA" in the last 168 hours. Coagulation Profile: No results for input(s): "INR", "PROTIME" in the last 168 hours. Cardiac Enzymes: No results for input(s): "CKTOTAL", "CKMB", "CKMBINDEX", "TROPONINI" in the last 168 hours. BNP (last 3 results) No results for input(s): "PROBNP" in the last 8760 hours. HbA1C: No results for input(s): "HGBA1C" in the last 72 hours. CBG: No results for input(s): "GLUCAP" in the last 168 hours. Lipid Profile: No results for input(s): "CHOL", "HDL", "LDLCALC", "TRIG", "CHOLHDL", "LDLDIRECT" in the last 72 hours. Thyroid Function Tests: Recent Labs    11/21/23 0111  TSH 4.229   Anemia Panel: No results for input(s): "VITAMINB12", "FOLATE", "FERRITIN", "TIBC", "IRON", "RETICCTPCT" in the last 72 hours. Urine analysis:    Component Value Date/Time   COLORURINE  YELLOW (A) 11/20/2023 2054   APPEARANCEUR CLOUDY (A) 11/20/2023 2054   LABSPEC 1.024 11/20/2023 2054   LABSPEC 1.015 02/17/2016 1203   PHURINE 5.0 11/20/2023 2054   GLUCOSEU NEGATIVE 11/20/2023 2054   GLUCOSEU Negative 02/17/2016 1203   HGBUR SMALL (A) 11/20/2023 2054   BILIRUBINUR NEGATIVE 11/20/2023 2054   BILIRUBINUR Negative 02/17/2016 1203   KETONESUR NEGATIVE 11/20/2023 2054   PROTEINUR NEGATIVE 11/20/2023 2054   UROBILINOGEN 0.2 02/17/2016 1203   NITRITE NEGATIVE 11/20/2023 2054   LEUKOCYTESUR LARGE (A) 11/20/2023 2054   LEUKOCYTESUR Large 02/17/2016 1203    Radiological Exams on Admission: DG Chest Portable 1 View  Result Date: 11/21/2023 CLINICAL DATA:  Chest pain and shortness of breath EXAM: PORTABLE CHEST 1 VIEW COMPARISON:  11/01/2023 FINDINGS: Cardiac shadow is stable. The lungs are well aerated bilaterally. No focal infiltrate or effusion is seen. No bony abnormality is noted. IMPRESSION: No active disease. Electronically Signed   By: Alcide Clever M.D.   On: 11/21/2023 01:54     Data Reviewed: Relevant notes from primary care and specialist visits, past discharge summaries as available in EHR, including Care Everywhere. Prior diagnostic testing as pertinent to current admission diagnoses Updated medications and problem lists for reconciliation ED course, including vitals, labs, imaging, treatment and response to treatment Triage notes, nursing and pharmacy notes and ED provider's notes Notable results as noted in HPI   Assessment and Plan: Symptomatic tachycardia/atrial tachycardia Postural dizziness with presyncope Mild troponin elevation Patient symptomatic for palpitations associated with chest pain and shortness of breath Opponent elevation likely related to demand ischemia Recently saw cardiology, placed on Zio patch which she mailed in a few days ago Continuous cardiac monitoring Continue Toprol Cardiology consult for further recommendations  Urinary  tract infection Rocephin Follow cultures  Hypertension Continue losartan, metoprolol and amlodipine pending verification  Malignant neoplasm of upper-outer quadrant of left breast in female, estrogen receptor positive (HCC) No acute issues suspected.   D-dimer was negative Continue tamoxifen  DVT prophylaxis: Lovenox  Consults: caardiology, chmg  Advance Care Planning: full code  Family Communication: none  Disposition Plan: Back to previous home environment  Severity of Illness: The appropriate patient status for this patient is OBSERVATION. Observation status is judged to be reasonable and necessary in order to provide the required intensity of service to ensure the patient's safety. The patient's presenting symptoms, physical exam findings, and initial radiographic and laboratory data in the context of their medical condition is felt to place them at decreased risk for further clinical deterioration. Furthermore, it is anticipated that the patient will be medically stable for discharge from the hospital within 2 midnights of admission.   Author: Andris Baumann, MD 11/21/2023 3:11 AM  For on call review [email or web address].

## 2023-11-21 NOTE — Assessment & Plan Note (Addendum)
No acute issues.   D-dimer was negative Continue tamoxifen

## 2023-11-21 NOTE — Assessment & Plan Note (Addendum)
Continue amlodipine 5 mg daily, Toprol-XL was increased as outlined. Monitor BP's

## 2023-11-21 NOTE — ED Provider Notes (Signed)
[facility] Provider Note    Event Date/Time   First MD Initiated Contact with Patient 11/21/23 0025     (approximate)   History   Near Syncope   HPI  Marie Alvarez is a 89 y.o. female with history of previous breast cancer, hypertension, hyperlipidemia who presents to the emergency department with complaints of palpitations, chest tightness, shortness of breath and near syncope today.  States her heart rate was in the 150s.  She has no known history of arrhythmia.  No history of PE or DVT.  No lower extremity swelling or pain.  No fevers, cough, vomiting or diarrhea.   History provided by patient, family.    Past Medical History:  Diagnosis Date   Arthritis    Breast cancer (HCC) 2018   Right   Cancer (HCC) 2003   BREAST-LEFT   COVID-19    GERD (gastroesophageal reflux disease)    History of radiation therapy 03/01/18- 03/28/18   Right breast treated to 40.05 Gy in 15 fx followed by a boost of 10 Gy in 5 fx   Hx estrogen therapy 02/02/2012   Hyperlipidemia    Hypertension    Osteoporosis    Personal history of chemotherapy    left 03   Personal history of radiation therapy 2019   Scoliosis    Shingles    Suburethral cyst 03/2012    Past Surgical History:  Procedure Laterality Date   APPENDECTOMY  1946   BREAST BIOPSY Left    BREAST LUMPECTOMY Left 2003   BREAST LUMPECTOMY Right 01/12/2018   invasive ductal    BREAST LUMPECTOMY WITH RADIOACTIVE SEED LOCALIZATION Right 01/12/2018   Procedure: RIGHT BREAST LUMPECTOMY WITH RADIOACTIVE SEED LOCALIZATION;  Surgeon: Claud Kelp, MD;  Location: [facility] OR;  Service: General;  Laterality: Right;   BREAST SURGERY  07-2002   LEFT LUMPECTOMY   TONSILECTOMY, ADENOIDECTOMY, BILATERAL MYRINGOTOMY AND TUBES      MEDICATIONS:  Prior to Admission medications   Medication Sig Start Date End Date Taking? Authorizing Provider  acetaminophen (TYLENOL) 650 MG CR tablet Take 650 mg by mouth as needed for pain.     [provider]  amLODipine (NORVASC) 5 MG tablet Take 5 mg by mouth daily.    [provider]  cholecalciferol (VITAMIN D) 1000 units tablet Take 1,000 Units by mouth at bedtime.    [provider]  doxazosin (CARDURA) 4 MG tablet Take 1 tablet (4 mg total) by mouth daily. Patient not taking: Reported on 11/08/2023 12/26/18   Serena Croissant, MD  estradiol (ESTRACE) 0.1 MG/GM vaginal cream Place 1 Applicatorful vaginally 3 (three) times a week. Monday, Wednesday, Friday    [provider]  fexofenadine (ALLEGRA) 180 MG tablet Take 180 mg by mouth daily.    [provider]  losartan (COZAAR) 100 MG tablet Take 100 mg by mouth daily. Patient not taking: Reported on 11/08/2023 04/29/20   [provider]  metoprolol succinate (TOPROL XL) 25 MG 24 hr tablet Take 1 tablet (25 mg total) by mouth daily. 11/01/23 04/29/24  Arty Baumgartner, NP  Multiple Vitamins-Minerals (PRESERVISION AREDS 2) CAPS Take 1 tablet by mouth 2 (two) times daily. 04/29/20   [provider]  nortriptyline (PAMELOR) 10 MG capsule Take 3 capsules (30 mg total) by mouth at bedtime. 02/02/22   Serena Croissant, MD  pantoprazole (PROTONIX) 40 MG tablet Take 40 mg by mouth daily. May take a second 40 mg dose as needed for acid reflux  [provider]  pravastatin (PRAVACHOL) 40 MG tablet Take 40 mg by mouth at bedtime.    [provider]  tamoxifen (NOLVADEX) 20 MG tablet Take 1 tablet (20 mg total) by mouth daily. Patient taking differently: Take 20 mg by mouth at bedtime. 10/05/23   Serena Croissant, MD  triamcinolone cream (KENALOG) 0.1 % Apply 1 application  topically as needed (itching). 05/05/20   [provider]  vitamin B-12 (CYANOCOBALAMIN) 1000 MCG tablet Take 1,000 mcg by mouth at bedtime.    [provider]    Physical Exam   Triage Vital Signs: ED Triage Vitals  Encounter Vitals Group     BP 11/20/23 1707 131/66      Systolic BP Percentile --      Diastolic BP Percentile --      Pulse Rate 11/20/23 1707 (!) 111     Resp 11/20/23 1707 20     Temp 11/20/23 1707 98 F (36.7 C)     Temp Source 11/20/23 2054 Oral     SpO2 11/20/23 1707 96 %     Weight 11/20/23 1705 135 lb (61.2 kg)     Height 11/20/23 1705 5\' 3"  (1.6 m)     Head Circumference --      Peak Flow --      Pain Score 11/20/23 1705 0     Pain Loc --      Pain Education --      Exclude from Growth Chart --     Most recent vital signs: Vitals:   11/21/23 0120 11/21/23 0136  BP:  (!) 139/90  Pulse:  99  Resp: 17 18  Temp:  98 F (36.7 C)  SpO2:  100%    CONSTITUTIONAL: Alert, responds appropriately to questions.  Elderly, anxious HEAD: Normocephalic, atraumatic EYES: Conjunctivae clear, pupils appear equal, sclera nonicteric ENT: normal nose; moist mucous membranes NECK: Supple, normal ROM CARD: Regular and tachycardic; S1 and S2 appreciated RESP: Normal chest excursion without splinting or tachypnea; breath sounds clear and equal bilaterally; no wheezes, no rhonchi, no rales, no hypoxia or respiratory distress, speaking full sentences ABD/GI: Non-distended; soft, non-tender, no rebound, no guarding, no peritoneal signs BACK: The back appears normal EXT: Normal ROM in all joints; no deformity noted, no edema, no calf tenderness or calf swelling SKIN: Normal color for age and race; warm; no rash on exposed skin NEURO: Moves all extremities equally, normal speech PSYCH: The patient's mood and manner are appropriate.   ED Results / Procedures / Treatments   LABS: (all labs ordered are listed, but only abnormal results are displayed) Labs Reviewed  BASIC METABOLIC PANEL - Abnormal; Notable for the following components:      Result Value   CO2 21 (*)    Glucose, Bld 128 (*)    GFR, Estimated 54 (*)    All other components within normal limits  URINALYSIS, ROUTINE W REFLEX MICROSCOPIC - Abnormal; Notable for the following  components:   Color, Urine YELLOW (*)    APPearance CLOUDY (*)    Hgb urine dipstick SMALL (*)    Leukocytes,Ua LARGE (*)    All other components within normal limits  TROPONIN I (HIGH SENSITIVITY) - Abnormal; Notable for the following components:   Troponin I (High Sensitivity) 33 (*)    All other components within normal limits  TROPONIN I (HIGH SENSITIVITY) - Abnormal; Notable for the following components:   Troponin I (High Sensitivity) 29 (*)    All other components within  normal limits  URINE CULTURE  CBC  MAGNESIUM  TSH  D-DIMER, QUANTITATIVE (NOT AT [facility])  CBG MONITORING, ED  TROPONIN I (HIGH SENSITIVITY)     EKG:  EKG Interpretation Date/Time:  Monday November 20 2023 17:06:16 EST Ventricular Rate:  108 PR Interval:  160 QRS Duration:  74 QT Interval:  330 QTC Calculation: 442 R Axis:   6  Text Interpretation: Sinus tachycardia Otherwise normal ECG When compared with ECG of 08-Nov-2023 10:35, Questionable change in QRS axis Confirmed by Rochele Raring [phone]) on 11/21/2023 12:29:12 AM         RADIOLOGY: My personal review and interpretation of imaging: Chest x-ray clear.  I have personally reviewed all radiology reports.   DG Chest Portable 1 View  Result Date: 11/21/2023 CLINICAL DATA:  Chest pain and shortness of breath EXAM: PORTABLE CHEST 1 VIEW COMPARISON:  11/01/2023 FINDINGS: Cardiac shadow is stable. The lungs are well aerated bilaterally. No focal infiltrate or effusion is seen. No bony abnormality is noted. IMPRESSION: No active disease. Electronically Signed   By: Alcide Clever M.D.   On: 11/21/2023 01:54     PROCEDURES:  Critical Care performed: No     .1-3 Lead EKG Interpretation  Performed by: Alexa Blish, Layla Maw, DO Authorized by: Jareli Highland, Layla Maw, DO     Interpretation: abnormal     ECG rate:  111   ECG rate assessment: tachycardic     Rhythm: sinus tachycardia     Ectopy: none     Conduction: normal       IMPRESSION / MDM /  ASSESSMENT AND PLAN / ED COURSE  I reviewed the triage vital signs and the nursing notes.    Patient here with chest pain, shortness of breath, palpitations, near syncope.  The patient is on the cardiac monitor to evaluate for evidence of arrhythmia and/or significant heart rate changes.   DIFFERENTIAL DIAGNOSIS (includes but not limited to):   ACS, arrhythmia, electrolyte derangement, thyroid dysfunction, dehydration, UTI, PE   Patient's presentation is most consistent with acute presentation with potential threat to life or bodily function.   PLAN: EKG shows sinus tachycardia.  No ischemia.  First troponin normal at 13 but has gone up to 33.  Will obtain third troponin.  Will give aspirin.  She is currently asymptomatic.  Normal electrolytes.  Normal hemoglobin.  Will obtain chest x-ray.  Given history of breast cancer with palpitations and tachycardia, will check D-dimer for evaluation for PE.  Will also check TSH, magnesium level.  Patient will need admission.   MEDICATIONS GIVEN IN ED: Medications  cefTRIAXone (ROCEPHIN) 1 g in sodium chloride 0.9 % 100 mL IVPB (has no administration in time range)  tamoxifen (NOLVADEX) tablet 20 mg (has no administration in time range)  amLODipine (NORVASC) tablet 5 mg (has no administration in time range)  metoprolol succinate (TOPROL-XL) 24 hr tablet 25 mg (has no administration in time range)  pravastatin (PRAVACHOL) tablet 40 mg (has no administration in time range)  sodium chloride flush (NS) 0.9 % injection 3 mL (3 mLs Intravenous Not Given 11/21/23 0317)  enoxaparin (LOVENOX) injection 40 mg (has no administration in time range)  acetaminophen (TYLENOL) tablet 650 mg (has no administration in time range)    Or  acetaminophen (TYLENOL) suppository 650 mg (has no administration in time range)  ondansetron (ZOFRAN) tablet 4 mg (has no administration in time range)    Or  ondansetron (ZOFRAN) injection 4 mg (has no administration in  time range)  aspirin chewable tablet 324 mg (324 mg Oral Given 11/21/23 0111)  cefTRIAXone (ROCEPHIN) 1 g in sodium chloride 0.9 % 100 mL IVPB (0 g Intravenous Stopped 11/21/23 0158)     ED COURSE: D-dimer negative.  Normal magnesium and TSH.  Patient has a UTI.  Culture pending.  Getting Rocephin.  CONSULTS:  Consulted and discussed patient's case with hospitalist, Dr. Para March.  I have recommended admission and consulting physician agrees and will place admission orders.  Patient (and family if present) agree with this plan.   I reviewed all nursing notes, vitals, pertinent previous records.  All labs, EKGs, imaging ordered have been independently reviewed and interpreted by myself.    OUTSIDE RECORDS REVIEWED: Reviewed PCP notes from yesterday.  Patient started on Avapro.       FINAL CLINICAL IMPRESSION(S) / ED DIAGNOSES   Final diagnoses:  Chest pain, unspecified type  Palpitations  Near syncope  Acute UTI     Rx / DC Orders   ED Discharge Orders     None        Note:  This document was prepared using Dragon voice recognition software and may include unintentional dictation errors.   Bentli Llorente, Layla Maw, DO 11/21/23 [phone]

## 2023-11-21 NOTE — Assessment & Plan Note (Addendum)
Postural dizziness with presyncope Mild troponin elevation - demand ischemia Pt presented with palpitations associated with chest pain and shortness of breath.  HR noted to be in 150's. Recently wore Zio patch outpatient. --Cardiology following --Telemetry - controlled HR's since admission on increased metoprolol dose --Echo normal EF --Toprol-XL 25 mg daily- increased to 50 mg BID

## 2023-11-22 DIAGNOSIS — I479 Paroxysmal tachycardia, unspecified: Secondary | ICD-10-CM | POA: Diagnosis not present

## 2023-11-22 DIAGNOSIS — N390 Urinary tract infection, site not specified: Secondary | ICD-10-CM | POA: Diagnosis not present

## 2023-11-22 DIAGNOSIS — R55 Syncope and collapse: Secondary | ICD-10-CM

## 2023-11-22 DIAGNOSIS — R42 Dizziness and giddiness: Secondary | ICD-10-CM | POA: Diagnosis not present

## 2023-11-22 LAB — CBC WITH DIFFERENTIAL/PLATELET
Abs Immature Granulocytes: 0.02 10*3/uL (ref 0.00–0.07)
Basophils Absolute: 0 10*3/uL (ref 0.0–0.1)
Basophils Relative: 1 %
Eosinophils Absolute: 0.2 10*3/uL (ref 0.0–0.5)
Eosinophils Relative: 3 %
HCT: 40.9 % (ref 36.0–46.0)
Hemoglobin: 13.8 g/dL (ref 12.0–15.0)
Immature Granulocytes: 0 %
Lymphocytes Relative: 29 %
Lymphs Abs: 2.2 10*3/uL (ref 0.7–4.0)
MCH: 31.1 pg (ref 26.0–34.0)
MCHC: 33.7 g/dL (ref 30.0–36.0)
MCV: 92.1 fL (ref 80.0–100.0)
Monocytes Absolute: 0.6 10*3/uL (ref 0.1–1.0)
Monocytes Relative: 8 %
Neutro Abs: 4.4 10*3/uL (ref 1.7–7.7)
Neutrophils Relative %: 59 %
Platelets: 158 10*3/uL (ref 150–400)
RBC: 4.44 MIL/uL (ref 3.87–5.11)
RDW: 12.7 % (ref 11.5–15.5)
WBC: 7.4 10*3/uL (ref 4.0–10.5)
nRBC: 0 % (ref 0.0–0.2)

## 2023-11-22 LAB — BASIC METABOLIC PANEL
Anion gap: 7 (ref 5–15)
BUN: 15 mg/dL (ref 8–23)
CO2: 23 mmol/L (ref 22–32)
Calcium: 9 mg/dL (ref 8.9–10.3)
Chloride: 107 mmol/L (ref 98–111)
Creatinine, Ser: 0.65 mg/dL (ref 0.44–1.00)
GFR, Estimated: >60 mL/min (ref >60)
Glucose, Bld: 96 mg/dL (ref 70–99)
Potassium: 3.9 mmol/L (ref 3.5–5.1)
Sodium: 137 mmol/L (ref 135–145)

## 2023-11-22 LAB — URINE CULTURE

## 2023-11-22 MED ORDER — METOPROLOL SUCCINATE ER 25 MG PO TB24
25.0000 mg | ORAL_TABLET | Freq: Once | ORAL | Status: AC
  Administered 2023-11-22: 25 mg via ORAL
  Filled 2023-11-22: qty 1

## 2023-11-22 MED ORDER — METOPROLOL SUCCINATE ER 50 MG PO TB24
50.0000 mg | ORAL_TABLET | Freq: Two times a day (BID) | ORAL | Status: DC
  Administered 2023-11-22 – 2023-11-23 (×2): 50 mg via ORAL
  Filled 2023-11-22 (×2): qty 1

## 2023-11-22 NOTE — Plan of Care (Signed)

## 2023-11-22 NOTE — Plan of Care (Signed)
  Problem: Education: Goal: Knowledge of condition and prescribed therapy will improve Outcome: Progressing   

## 2023-11-22 NOTE — Progress Notes (Signed)
Patient Name: Marie Alvarez Date of Encounter: 11/22/2023 [facility] Cardiologist: None   Interval Summary  .    Sitting up eating lunch, son and grandson at the bedside She reports that she lives with her grandson Discussed recent events Episode of tachycardia November 20 wide-complex around 150 bpm, Did not terminate with adenosine in the ER, spontaneously converted back to normal sinus rhythm in the ER prior to discharge Seen by EP November 08, 2023, continued on metoprolol succinate 25 daily, 1 week ZIO monitor ordered Presenting November 20, 2023 with recurrent tachycardia, near syncope, reported rate 150s She had been overdoing it with her activities for 2 days prior to admission Results of Zio monitor unavailable Echocardiogram with normal LV function In the ER this admission EKG showing sinus tachycardia 110 bpm UA positive Issues with anxiety  Vital Signs .    Vitals:   11/21/23 1800 11/21/23 2052 11/21/23 2335 11/22/23 0426  BP: 132/88 (!) 152/82 (!) 155/86 (!) 151/69  Pulse: 88 80 99 73  Resp: 20 18    Temp: 98.6 F (37 C) 98.1 F (36.7 C) 97.9 F (36.6 C) 98.5 F (36.9 C)  TempSrc: Oral     SpO2: 98% 97% 98% 98%  Weight:      Height:        Intake/Output Summary (Last 24 hours) at 11/22/2023 0811 Last data filed at 11/21/2023 1914 Gross per 24 hour  Intake 0 ml  Output --  Net 0 ml      11/20/2023    5:05 PM 11/08/2023   10:28 AM 11/01/2023    9:56 AM  Last 3 Weights  Weight (lbs) 135 lb 135 lb 12.8 oz 138 lb 0.1 oz  Weight (kg) 61.236 kg 61.598 kg 62.6 kg      Telemetry/ECG    Normal sinus rhythm- Personally Reviewed  Physical Exam .   Constitutional:  oriented to person, place, and time. No distress.  HENT:  Head: Grossly normal Eyes:  no discharge. No scleral icterus.  Neck: No JVD, no carotid bruits  Cardiovascular: Regular rate and rhythm, no murmurs appreciated Pulmonary/Chest: Clear to auscultation bilaterally, no  wheezes or rails Abdominal: Soft.  no distension.  no tenderness.  Musculoskeletal: Normal range of motion Neurological:  normal muscle tone. Coordination normal. No atrophy Skin: Skin warm and dry Psychiatric: normal affect, pleasant  Assessment & Plan .     A/P: Paroxysmal tachycardia First episode November 20 wide-complex going 150 bpm, converted spontaneously Recurrent episode November 20, 2023 again converting spontaneously Significant anxiety concerning her arrhythmia Yesterday up to metoprolol succinate 25 twice daily We will advance her metoprolol succinate up to 50 twice daily given adequate heart rate and blood pressure allowing titration Long discussion with patient, son and grandson, she has severe anxiety concerning recurrent episodes.  Discussed with her that unfortunately there is no guarantee that she will not have further episodes but hope that higher dose metoprolol will help suppress arrhythmia -Scheduled for follow-up with EP in January 2025 For recurrent arrhythmia, may need antiarrhythmic medications -Zio monitor results not available at this time -Echo with normal ejection fraction, no acute findings  Essential hypertension Would continue amlodipine 5 daily, metoprolol succinate 50 twice daily Hold ARB  Anxiety Will defer to primary care, she reports having issues with insomnia and anxiety in the daytime Reassurance provided though clearly still anxious  Urinary tract infection On antibiotics given positive UA, cultures pending  Elevated troponin Minimally elevated peak 33 in the  setting of tachycardia No plans for ischemic workup at this time    For questions or updates, please contact [facility] Please consult [email or web address] for contact info under        Signed, SHERI HAMMOCK, NP

## 2023-11-22 NOTE — Assessment & Plan Note (Addendum)
Due to tachycardic episodes. Resolved with adequate HR control.

## 2023-11-22 NOTE — Progress Notes (Signed)
  Progress Note   Patient: Marie Alvarez ZOX:[id number] DOB: 1931/02/21 DOA: 11/21/2023     0 DOS: the patient was seen and examined on 11/22/2023   Brief hospital course: "Marie Alvarez is a 89 y.o. female with medical history significant for HTN, breast cancer s/p chemoradiation who presents with a presyncopal episode as stated with chest pain shortness of breath and palpitations.  Patient was seen in the ED on 11/01/2023 with a wide-complex tachyarrhythmia with rate of 150, which spontaneously converted.  She subsequently followed up with cardiology on 11/27 when she complained of having dizzy spells and feeling like she is about to pass out.  She was diagnosed with atrial tachycardia, placed on metoprolol and recently wore a Zio patch.  Cardiology following.  Although UA showed pyuria patient is asymptomatic and this was not a clean-catch given normal WBC pharmacist recommended discontinuation of ceftriaxone. " See H&P for full HPI on admission & ED course.    Assessment and Plan: * Postural dizziness with presyncope .  Symptomatic tachycardia/atrial tachycardia Postural dizziness with presyncope Mild troponin elevation - demand ischemia Pt presented with palpitations associated with chest pain and shortness of breath.  HR noted to be in 150's. Recently wore Zio patch outpatient. --Cardiology following --Telemetry --Cardiology attempting to get Zio results for review --Following pending echo --Toprol-XL 25 mg daily --May need EP consultation Continue Toprol Cardiology consult for further recommendations  Urinary tract infection Rocephin dc'd Urine culture grew multiple species. Pt denies UTI symptoms  Hypertension Continue amlodipine 5 mg daily, Toprol-XL 25 mg daily Monitor BP's  Malignant neoplasm of upper-outer quadrant of left breast in female, estrogen receptor positive (HCC) No acute issues.   D-dimer was negative Continue tamoxifen        Subjective: Pt seen awake  resting in bed this AM.  She reports her son went for lunch and wants to see me.  She wants to get up moving around.  She is not sure if dizzy today so far.  Denies chest pain or shortness of breath at rest.   Physical Exam: Vitals:   11/21/23 2335 11/22/23 0426 11/22/23 0809 11/22/23 1141  BP: (!) 155/86 (!) 151/69 (!) 143/69 139/62  Pulse: 99 73 72 75  Resp:   16 16  Temp: 97.9 F (36.6 C) 98.5 F (36.9 C) 98.1 F (36.7 C) 98 F (36.7 C)  TempSrc:      SpO2: 98% 98% 98% 98%  Weight:      Height:       General exam: awake, alert, no acute distress HEENT: atraumatic, clear conjunctiva, anicteric sclera, moist mucus membranes, hearing grossly normal  Respiratory system: CTA, no wheezes, rales or rhonchi, normal respiratory effort. Cardiovascular system: normal S1/S2, RRR, no JVD, murmurs, rubs, gallops, no pedal edema.   Gastrointestinal system: soft, NT, ND, no HSM felt, +bowel sounds. Central nervous system: A&O x 3. no gross focal neurologic deficits, normal speech Extremities: moves all, no edema, normal tone Skin: dry, intact, normal temperature Psychiatry: normal mood, congruent affect    Data Reviewed:  Notable labs --   normal BMP and CBC  Family Communication: none present on rounds. Will return when family are present or attempt to call this afternoon.  Disposition: Status is: Observation -- Cardiology evaluation still underway.   Planned Discharge Destination: Home    Time spent: 42 minutes  Author: Pennie Banter, DO 11/22/2023 12:38 PM  For on call review [email or web address].

## 2023-11-23 DIAGNOSIS — R55 Syncope and collapse: Secondary | ICD-10-CM | POA: Diagnosis not present

## 2023-11-23 DIAGNOSIS — R42 Dizziness and giddiness: Secondary | ICD-10-CM | POA: Diagnosis not present

## 2023-11-23 MED ORDER — METOPROLOL SUCCINATE ER 50 MG PO TB24: 50.0000 mg | ORAL_TABLET | Freq: Two times a day (BID) | ORAL | 2 refills | Status: DC

## 2023-11-23 NOTE — Discharge Summary (Signed)
Physician Discharge Summary   Patient: Marie Alvarez MRN: [id number] DOB: [birth year]  Admit date:     11/21/2023  Discharge date: 11/23/2023  Discharge Physician: Pennie Banter   PCP: Rodrigo Ran, MD   Recommendations at discharge:   Follow up with Cardiology, Electrophysiology - Dr. Lalla Brothers - as scheduled Follow up with Primary Care in 1-2 weeks Repeat CBC, BMP, Mg at follow up  Discharge Diagnoses: Active Problems:   Malignant neoplasm of upper-outer quadrant of left breast in female, estrogen receptor positive (HCC)   Hypertension   Near syncope   Paroxysmal tachycardia (HCC)  Principal Problem (Resolved):   Postural dizziness with presyncope Resolved Problems:   Chest pain   Symptomatic tachycardia/atrial tachycardia   Acute UTI  Hospital Course: "Marie Alvarez is a 89 y.o. female with medical history significant for HTN, breast cancer s/p chemoradiation who presents with a presyncopal episode as stated with chest pain shortness of breath and palpitations.  Patient was seen in the ED on 11/01/2023 with a wide-complex tachyarrhythmia with rate of 150, which spontaneously converted.  She subsequently followed up with cardiology on 11/27 when she complained of having dizzy spells and feeling like she is about to pass out.  She was diagnosed with atrial tachycardia, placed on metoprolol and recently wore a Zio patch.  Cardiology following.  Although UA showed pyuria patient is asymptomatic and this was not a clean-catch given normal WBC pharmacist recommended discontinuation of ceftriaxone.   Other detailed assessment and plan as outlined in H&P by admitting physician"   11/23/23 -- pt doing well, has not had recurrent episodes of tachycardia on increased dose of metoprolol.  Cleared from Cardiology's standpoint for discharge with EP follow up as scheduled next month.  Patient ambulated and HR's remained controlled, pt asymptomatic.  Medically stable for discharge home today.  No  PT follow up recommended.    Assessment and Plan: * Postural dizziness with presyncope-resolved as of 11/24/2023 Due to tachycardic episodes. Resolved with adequate HR control.  Symptomatic tachycardia/atrial tachycardia-resolved as of 11/24/2023 Postural dizziness with presyncope Mild troponin elevation - demand ischemia Pt presented with palpitations associated with chest pain and shortness of breath.  HR noted to be in 150's. Recently wore Zio patch outpatient. --Cardiology following --Telemetry - controlled HR's since admission on increased metoprolol dose --Echo normal EF --Toprol-XL 25 mg daily- increased to 50 mg BID  Chest pain-resolved as of 11/24/2023 Resolved - due to tachycardic episodes.  Acute UTI-resolved as of 11/24/2023 Ruled Out - Urine culture grew multiple species.   Rocephin dc'd. Pt denies UTI symptoms  Paroxysmal tachycardia [facility]) Cardiology consulted. Outpatient follow up with EP. Metoprolol dose was increased to 50 mg BID Recent Zio monitor results were not available per cardiology, results can be discussed with patient at follow up visit.  Near syncope Due to tachycardic episodes. Pt asymptomatic now that HR is controlled.  Hypertension Continue amlodipine 5 mg daily, Toprol-XL was increased as outlined. Monitor BP's  Malignant neoplasm of upper-outer quadrant of left breast in female, estrogen receptor positive (HCC) No acute issues.   D-dimer was negative Continue tamoxifen         Consultants: Cardiology Procedures performed: Echo  Disposition: Home Diet recommendation:  Cardiac diet DISCHARGE MEDICATION: Allergies as of 11/23/2023       Reactions   Amoxicillin-pot Clavulanate Other (See Comments)   Gi intolerance    Letrozole Other (See Comments)   Aches and pains    Lisinopril Other (See Comments)   "  Made me feel bad"    Prednisone Other (See Comments)   "makes me goofy"   Sulfamethoxazole Rash        Medication  List     STOP taking these medications    Avapro 150 MG tablet Generic drug: irbesartan   losartan 100 MG tablet Commonly known as: COZAAR   triamcinolone cream 0.1 % Commonly known as: KENALOG       TAKE these medications    acetaminophen 650 MG CR tablet Commonly known as: TYLENOL Take 650 mg by mouth as needed for pain.   amLODipine 5 MG tablet Commonly known as: NORVASC Take 5 mg by mouth daily.   cholecalciferol 1000 units tablet Commonly known as: VITAMIN D Take 1,000 Units by mouth at bedtime.   cyanocobalamin 1000 MCG tablet Commonly known as: VITAMIN B12 Take 1,000 mcg by mouth at bedtime.   doxazosin 4 MG tablet Commonly known as: Cardura Take 1 tablet (4 mg total) by mouth daily.   estradiol 0.1 MG/GM vaginal cream Commonly known as: ESTRACE Place 1 Applicatorful vaginally 3 (three) times a week. Monday, Wednesday, Friday   fexofenadine 180 MG tablet Commonly known as: ALLEGRA Take 180 mg by mouth daily.   metoprolol succinate 50 MG 24 hr tablet Commonly known as: TOPROL-XL Take 1 tablet (50 mg total) by mouth 2 (two) times daily. Take with or immediately following a meal. What changed:  medication strength how much to take when to take this additional instructions   nortriptyline 10 MG capsule Commonly known as: PAMELOR Take 3 capsules (30 mg total) by mouth at bedtime. What changed: how much to take   pantoprazole 40 MG tablet Commonly known as: PROTONIX Take 40 mg by mouth daily. May take a second 40 mg dose as needed for acid reflux   pravastatin 40 MG tablet Commonly known as: PRAVACHOL Take 40 mg by mouth at bedtime.   PreserVision AREDS 2 Caps Take 1 tablet by mouth 2 (two) times daily.   tamoxifen 20 MG tablet Commonly known as: NOLVADEX Take 1 tablet (20 mg total) by mouth daily. What changed: when to take this        Follow-up Information     Rodrigo Ran, MD Follow up.   Specialty: Internal Medicine Why:  Hospital follow up Contact information: [address] [place] [address] [phone]                Discharge Exam: Ceasar Mons Weights   11/20/23 1705  Weight: 61.2 kg   General exam: awake, alert, no acute distress HEENT: atraumatic, clear conjunctiva, anicteric sclera, moist mucus membranes, hearing grossly normal  Respiratory system: CTAB, no wheezes, rales or rhonchi, normal respiratory effort. Cardiovascular system: normal S1/S2, RRR, no JVD, murmurs, rubs, gallops, no pedal edema.   Gastrointestinal system: soft, NT, ND, no HSM felt, +bowel sounds. Central nervous system: A&O x 3. no gross focal neurologic deficits, normal speech Extremities: moves all, no edema, normal tone Skin: dry, intact, normal temperature, normal color, No rashes, lesions or ulcers Psychiatry: anxious mood, congruent affect, judgement and insight appear normal   Condition at discharge: stable  The results of significant diagnostics from this hospitalization (including imaging, microbiology, ancillary and laboratory) are listed below for reference.   Imaging Studies: ECHOCARDIOGRAM COMPLETE Result Date: 11/21/2023    ECHOCARDIOGRAM REPORT   Patient Name:   TORIONA DEPIES Date of Exam: 11/21/2023 Medical Rec #:  [id number]   Height:       63.0 in Accession #:  [id number]  Weight:       135.0 lb Date of Birth:  September 05, 1931   BSA:          1.636 m Patient Age:    89 years    BP:           130/88 mmHg Patient Gender: F           HR:           104 bpm. Exam Location:  [facility] Procedure: 2D Echo, Cardiac Doppler and Color Doppler Indications:     Syncope  History:         Patient has no prior history of Echocardiogram examinations.                  Signs/Symptoms:Syncope, Dizziness/Lightheadedness and Chest                  Pain; Risk Factors:Hypertension and Dyslipidemia.  Sonographer:     Mikki Harbor Referring Phys:  [id number] Andris Baumann Diagnosing Phys: Yvonne Kendall MD IMPRESSIONS  1. Left  ventricular ejection fraction, by estimation, is 55 to 60%. The left ventricle has normal function. The left ventricle has no regional wall motion abnormalities. Left ventricular diastolic parameters are consistent with Grade I diastolic dysfunction (impaired relaxation).  2. Right ventricular systolic function is normal. The right ventricular size is normal. There is normal pulmonary artery systolic pressure.  3. The mitral valve is degenerative. Trivial mitral valve regurgitation. No evidence of mitral stenosis.  4. The aortic valve is tricuspid. Aortic valve regurgitation is not visualized. No aortic stenosis is present.  5. The inferior vena cava is normal in size with greater than 50% respiratory variability, suggesting right atrial pressure of 3 mmHg. FINDINGS  Left Ventricle: Left ventricular ejection fraction, by estimation, is 55 to 60%. The left ventricle has normal function. The left ventricle has no regional wall motion abnormalities. The left ventricular internal cavity size was normal in size. There is  borderline left ventricular hypertrophy. Left ventricular diastolic parameters are consistent with Grade I diastolic dysfunction (impaired relaxation). Right Ventricle: The right ventricular size is normal. No increase in right ventricular wall thickness. Right ventricular systolic function is normal. There is normal pulmonary artery systolic pressure. The tricuspid regurgitant velocity is 2.46 m/s, and  with an assumed right atrial pressure of 3 mmHg, the estimated right ventricular systolic pressure is 27.2 mmHg. Left Atrium: Left atrial size was normal in size. Right Atrium: Right atrial size was normal in size. Pericardium: There is no evidence of pericardial effusion. Mitral Valve: The mitral valve is degenerative in appearance. There is mild thickening of the mitral valve leaflet(s). There is mild calcification of the mitral valve leaflet(s). Trivial mitral valve regurgitation. No evidence of  mitral valve stenosis. MV peak gradient, 12.2 mmHg. The mean mitral valve gradient is 3.0 mmHg. Tricuspid Valve: The tricuspid valve is not well visualized. Tricuspid valve regurgitation is mild. Aortic Valve: The aortic valve is tricuspid. Aortic valve regurgitation is not visualized. No aortic stenosis is present. Aortic valve mean gradient measures 1.5 mmHg. Aortic valve peak gradient measures 3.0 mmHg. Aortic valve area, by VTI measures 3.04 cm. Pulmonic Valve: The pulmonic valve was normal in structure. Pulmonic valve regurgitation is mild. No evidence of pulmonic stenosis. Aorta: The aortic root is normal in size and structure. Pulmonary Artery: The pulmonary artery is of normal size. Venous: The inferior vena cava is normal in size with greater than 50% respiratory variability, suggesting right atrial pressure  of 3 mmHg. IAS/Shunts: No atrial level shunt detected by color flow Doppler.  LEFT VENTRICLE PLAX 2D LVIDd:         3.00 cm   Diastology LVIDs:         2.10 cm   LV e' medial:    6.20 cm/s LV PW:         1.04 cm   LV E/e' medial:  6.0 LV IVS:        0.93 cm   LV e' lateral:   8.49 cm/s LVOT diam:     2.00 cm   LV E/e' lateral: 4.4 LV SV:         43 LV SV Index:   26 LVOT Area:     3.14 cm  RIGHT VENTRICLE RV Basal diam:  3.60 cm RV Mid diam:    3.50 cm RV S prime:     13.40 cm/s TAPSE (M-mode): 2.4 cm LEFT ATRIUM             Index        RIGHT ATRIUM           Index LA diam:        2.90 cm 1.77 cm/m   RA Area:     13.20 cm LA Vol (A2C):   51.7 ml 31.60 ml/m  RA Volume:   32.70 ml  19.99 ml/m LA Vol (A4C):   25.1 ml 15.34 ml/m LA Biplane Vol: 38.4 ml 23.47 ml/m  AORTIC VALVE                    PULMONIC VALVE AV Area (Vmax):    2.55 cm     PV Vmax:       0.87 m/s AV Area (Vmean):   2.64 cm     PV Peak grad:  3.0 mmHg AV Area (VTI):     3.04 cm AV Vmax:           86.45 cm/s AV Vmean:          55.800 cm/s AV VTI:            0.143 m AV Peak Grad:      3.0 mmHg AV Mean Grad:      1.5 mmHg LVOT  Vmax:         70.30 cm/s LVOT Vmean:        46.900 cm/s LVOT VTI:          0.138 m LVOT/AV VTI ratio: 0.97  AORTA Ao Root diam: 3.60 cm MITRAL VALVE                TRICUSPID VALVE MV Area (PHT): 5.38 cm     TR Peak grad:   24.2 mmHg MV Area VTI:   1.47 cm     TR Vmax:        246.00 cm/s MV Peak grad:  12.2 mmHg MV Mean grad:  3.0 mmHg     SHUNTS MV Vmax:       1.75 m/s     Systemic VTI:  0.14 m MV Vmean:      77.8 cm/s    Systemic Diam: 2.00 cm MV Decel Time: 141 msec MV E velocity: 37.20 cm/s MV A velocity: 144.00 cm/s MV E/A ratio:  0.26 Cristal Deer End MD Electronically signed by Yvonne Kendall MD Signature Date/Time: 11/21/2023/4:22:28 PM    Final    DG Chest Portable 1 View Result Date: 11/21/2023 CLINICAL DATA:  Chest pain and shortness  of breath EXAM: PORTABLE CHEST 1 VIEW COMPARISON:  11/01/2023 FINDINGS: Cardiac shadow is stable. The lungs are well aerated bilaterally. No focal infiltrate or effusion is seen. No bony abnormality is noted. IMPRESSION: No active disease. Electronically Signed   By: Alcide Clever M.D.   On: 11/21/2023 01:54   DG Chest Port 1 View Result Date: 11/01/2023 CLINICAL DATA:  Weakness. EXAM: PORTABLE CHEST 1 VIEW COMPARISON:  Chest radiograph dated November 04, 2022.  [facility] FINDINGS: The heart size and mediastinal contours are within normal limits. Coarsened appearance of the interstitium, likely chronic. No definite focal consolidation. No sizable pleural effusion or pneumothorax. No acute osseous abnormality. IMPRESSION: No acute cardiopulmonary findings. Electronically Signed   By: Hart Robinsons M.D.   On: 11/01/2023 11:26    Microbiology: Results for orders placed or performed during the hospital encounter of 11/21/23  Urine Culture     Status: Abnormal   Collection Time: 11/20/23  8:54 PM   Specimen: Urine, Random  Result Value Ref Range Status   Specimen Description   Final    URINE, RANDOM Performed at [facility], [address]  [place], [place] [address]    Special Requests   Final    NONE Performed at [facility], [address] [place], [place] [address]    Culture MULTIPLE SPECIES PRESENT, SUGGEST RECOLLECTION (A)  Final   Report Status 11/22/2023 FINAL  Final    Labs: CBC: Recent Labs  Lab 11/20/23 1710 11/22/23 0555  WBC 9.2 7.4  NEUTROABS  --  4.4  HGB 14.9 13.8  HCT 44.2 40.9  MCV 93.8 92.1  PLT 186 158   Basic Metabolic Panel: Recent Labs  Lab 11/20/23 1710 11/21/23 0111 11/22/23 0555  NA 135  --  137  K 4.0  --  3.9  CL 102  --  107  CO2 21*  --  23  GLUCOSE 128*  --  96  BUN 16  --  15  CREATININE 1.00  --  0.65  CALCIUM 9.5  --  9.0  MG  --  2.3  --    Liver Function Tests: No results for input(s): "AST", "ALT", "ALKPHOS", "BILITOT", "PROT", "ALBUMIN" in the last 168 hours. CBG: No results for input(s): "GLUCAP" in the last 168 hours.  Discharge time spent: greater than 30 minutes.  Signed: Pennie Banter, DO [facility] 11/24/2023

## 2023-11-23 NOTE — Evaluation (Signed)
Physical Therapy Evaluation Patient Details Name: Marie Alvarez MRN: [id number] DOB: [birth year] Today's Date: 11/23/2023  History of Present Illness  Marie Alvarez is a 89 y.o. female with medical history significant for HTN, breast cancer s/p chemoradiation who presents with a presyncopal episode as stated with chest pain shortness of breath and palpitations.  Clinical Impression  Patient received up in recliner. Son present in room along with Cardiologist and hospitalist. Patient agreeable to PT assessment. She is able to stand without A. Ambulated 300 feet without AD. No lob. HR remained stable throughout session 82-90 bpm. Patient appears to be at baseline level of functioning. Does not require skilled PT at this time. May benefit from having a RW at home.             If plan is discharge home, recommend the following:     Can travel by private vehicle    yes    Equipment Recommendations Rolling walker (2 wheels)  Recommendations for Other Services       Functional Status Assessment Patient has not had a recent decline in their functional status     Precautions / Restrictions Precautions Precautions: None Restrictions Weight Bearing Restrictions Per Provider Order: No      Mobility  Bed Mobility               General bed mobility comments: NT patient up in recliner    Transfers Overall transfer level: Independent Equipment used: None                    Ambulation/Gait Ambulation/Gait assistance: Supervision Gait Distance (Feet): 300 Feet Assistive device: None Gait Pattern/deviations: Step-through pattern, Decreased step length - right, Decreased step length - left, Decreased stride length Gait velocity: decr     General Gait Details: stable with ambulation, no AD. HR 82-90 throughout mobility  Stairs            Wheelchair Mobility     Tilt Bed    Modified Rankin (Stroke Patients Only)       Balance Overall balance assessment:  Independent                                           Pertinent Vitals/Pain Pain Assessment Pain Assessment: No/denies pain    Home Living Family/patient expects to be discharged to:: Private residence Living Arrangements: Alone Available Help at Discharge: Family;Available PRN/intermittently (grandson in and out) Type of Home: House Home Access: Ramped entrance         Home Equipment: None      Prior Function Prior Level of Function : Independent/Modified Independent;Driving             Mobility Comments: does not use AD at baseline ADLs Comments: Independent     Extremity/Trunk Assessment   Upper Extremity Assessment Upper Extremity Assessment: Overall WFL for tasks assessed    Lower Extremity Assessment Lower Extremity Assessment: Overall WFL for tasks assessed    Cervical / Trunk Assessment Cervical / Trunk Assessment: Normal  Communication   Communication Communication: No apparent difficulties Cueing Techniques: Verbal cues  Cognition Arousal: Alert Behavior During Therapy: WFL for tasks assessed/performed Overall Cognitive Status: Within Functional Limits for tasks assessed  General Comments      Exercises     Assessment/Plan    PT Assessment Patient does not need any further PT services  PT Problem List         PT Treatment Interventions      PT Goals (Current goals can be found in the Care Plan section)  Acute Rehab PT Goals Patient Stated Goal: to return home PT Goal Formulation: With patient/family Time For Goal Achievement: 11/25/23 Potential to Achieve Goals: Good    Frequency       Co-evaluation               AM-PAC PT "6 Clicks" Mobility  Outcome Measure Help needed turning from your back to your side while in a flat bed without using bedrails?: None Help needed moving from lying on your back to sitting on the side of a flat bed without  using bedrails?: None Help needed moving to and from a bed to a chair (including a wheelchair)?: None Help needed standing up from a chair using your arms (e.g., wheelchair or bedside chair)?: None Help needed to walk in hospital room?: None Help needed climbing 3-5 steps with a railing? : None 6 Click Score: 24    End of Session   Activity Tolerance: Patient tolerated treatment well Patient left: in chair;with call bell/phone within reach Nurse Communication: Mobility status      Time: 1011-1029 PT Time Calculation (min) (ACUTE ONLY): 18 min   Charges:   PT Evaluation $PT Eval Low Complexity: 1 Low PT Treatments $Gait Training: 8-22 mins PT General Charges $$ ACUTE PT VISIT: 1 Visit         Haelee Bolen, PT, GCS 11/23/23,10:52 AM

## 2023-11-23 NOTE — Care Management Obs Status (Signed)
MEDICARE OBSERVATION STATUS NOTIFICATION   Patient Details  Name: Marie Alvarez MRN: [id number] Date of Birth: 07/24/1931   Medicare Observation Status Notification Given:  Yes    Bronwen Pendergraft, LCSW 11/23/2023, 11:44 AM

## 2023-11-24 ENCOUNTER — Encounter: Payer: Self-pay | Admitting: Internal Medicine

## 2023-11-24 NOTE — Assessment & Plan Note (Signed)
Due to tachycardic episodes. Pt asymptomatic now that HR is controlled.

## 2023-11-24 NOTE — Assessment & Plan Note (Signed)
Resolved - due to tachycardic episodes.

## 2023-11-24 NOTE — Assessment & Plan Note (Signed)
Cardiology consulted. Outpatient follow up with EP. Metoprolol dose was increased to 50 mg BID Recent Zio monitor results were not available per cardiology, results can be discussed with patient at follow up visit.

## 2023-11-30 ENCOUNTER — Other Ambulatory Visit: Payer: Medicare HMO

## 2023-12-19 NOTE — Progress Notes (Signed)
 Electrophysiology Clinic Note    Date:  12/21/2023  Patient ID:  Marie Alvarez, Marie Alvarez [birth year], MRN [id number] PCP:  Shayne Anes, MD  Cardiologist:  None Electrophysiologist: Marie ONEIDA HOLTS, MD    Discussed the use of AI scribe software for clinical note transcription with the patient, who gave verbal consent to proceed.   Patient Profile    Chief Complaint: tachycardia  History of Present Illness: Marie Alvarez is a 89 y.o. female with PMH notable for wide-complex tachycardia, , HTN, HLD, Breast Ca; seen today for Marie ONEIDA HOLTS, MD for routine electrophysiology followup.   She presented to ER 11/20 in a wide-complex tachycardia going 150bpm. Her PCP had recently adjusted some BP medications d/t hypotension. She was given adenosine  in the ER > several sinus beats, then atrial tach with a narrow QRS. She then spontaneously converted to sinus rhythm and was discharged.   She last saw Dr. Holts 11/08/2023 for EP consult. No further tachycardic episodes, but she was very dizzy - he thought likely vertigo. He had recommended a TTE and 1 wk zio to further eval.  She re-presented to ER 12/10 with recurrent tachycardia. She was in sinus tach at that time up to 110s, with positive UA.  BB increased at discharge.  On follow-up today, she has not had any further episodes of tachycardia with palpitations that she is aware of. Her main complaint today is increased itching that started around New Years. She denies dizziness, LH, presyncope. She does have some increased lower extremity edema - admits to eating frequent sandwiches with deli meat and chik-fil-a sandwiches. She wears compression socks daily.  Throughout our conversation, the patient mentioned her anxiety and nerves several times. She checks her BP frequently, but thinks that her anxiety is causing the readings to be elevated. She has not taken a medicaiton that was recently rx'd by PCP's office.    AAD History: None      ROS:  Please see the history of present illness. All other systems are reviewed and otherwise negative.    Physical Exam    VS:  BP (!) 150/70 (BP Location: Left Arm, Patient Position: Sitting, Cuff Size: Normal)   Pulse 88   Ht 5' 3.5 (1.613 m)   Wt 132 lb 6 oz (60 kg)   SpO2 97%   BMI 23.08 kg/m  BMI: Body mass index is 23.08 kg/m.  Wt Readings from Last 3 Encounters:  12/20/23 132 lb 6 oz (60 kg)  11/20/23 135 lb (61.2 kg)  11/08/23 135 lb 12.8 oz (61.6 kg)     GEN- The patient is well appearing, alert and oriented x 3 today.  Appears anxious Lungs- Clear to ausculation bilaterally, normal work of breathing.  Heart- Regular rate and rhythm, no murmurs, rubs or gallops Extremities- 1+ peripheral edema, warm, dry    Studies Reviewed   Previous EP, cardiology notes.    EKG is ordered. Personal review of EKG from today shows:    EKG Interpretation Date/Time:  Wednesday December 20 2023 13:15:31 EST Ventricular Rate:  88 PR Interval:  158 QRS Duration:  74 QT Interval:  352 QTC Calculation: 425 R Axis:   1  Text Interpretation: Normal sinus rhythm Normal ECG Confirmed by Marie Alvarez [phone]) on 12/20/2023 1:17:58 PM    Long term monitor, 11/08/2023 Heart rate 59-190, average 85 Rare supraventricular and ventricular ectopy No atrial fibrillation 1 nonsustained VT, 4 beats 6 nonsustained SVT, longest 18.1 seconds No sustained arrhythmias  TTE, 11/21/2023  1. Left ventricular ejection fraction, by estimation, is 55 to 60%. The left ventricle has normal function. The left ventricle has no regional wall motion abnormalities. Left ventricular diastolic parameters are consistent with Grade I diastolic dysfunction (impaired relaxation).   2. Right ventricular systolic function is normal. The right ventricular size is normal. There is normal pulmonary artery systolic pressure.   3. The mitral valve is degenerative. Trivial mitral valve regurgitation. No evidence of  mitral stenosis.   4. The aortic valve is tricuspid. Aortic valve regurgitation is not visualized. No aortic stenosis is present.   5. The inferior vena cava is normal in size with greater than 50% respiratory variability, suggesting right atrial pressure of 3 mmHg.      Assessment and Plan     #) wide-complex tachycardia #) atach Very symptomatic during episodes, though significantly improved as of late TTE with preserved LVEF Continue 50mg  toprol  BID  #) HTN Slightly elevated today in office.  Continue to monitor at home, discussed proper BP mgmt techniques Reduce dietary sodium Continue BB as above, continue 5mg  amlodipine   #) anxiety Reports feeling anxious and nervous, affecting sleep and blood pressure. Has been prescribed an anxiolytic medication but has not taken it due to concerns about side effects. -Encouraged to take prescribed anxiolytic medication. -Continue nortriptyline . -Follow up with primary care provider to discuss anxiety management.       Current medicines are reviewed at length with the patient today.   The patient does not have concerns regarding her medicines.  The following changes were made today:  none  Labs/ tests ordered today include:  Orders Placed This Encounter  Procedures   EKG 12-Lead     Disposition: Follow up with Dr. Cindie or EP APP in 3 months   Signed, Marie Crosley, NP  12/21/23  7:52 AM  Electrophysiology [facility]

## 2023-12-20 ENCOUNTER — Ambulatory Visit: Payer: Medicare Other | Attending: Cardiology | Admitting: Cardiology

## 2023-12-20 ENCOUNTER — Encounter: Payer: Self-pay | Admitting: Cardiology

## 2023-12-20 VITALS — BP 150/70 | HR 88 | Ht 63.5 in | Wt 132.4 lb

## 2023-12-20 DIAGNOSIS — R000 Tachycardia, unspecified: Secondary | ICD-10-CM

## 2023-12-20 NOTE — Patient Instructions (Signed)
 Medication Instructions:   Your physician recommends that you continue on your current medications as directed. Please refer to the Current Medication list given to you today.   *If you need a refill on your cardiac medications before your next appointment, please call your pharmacy*   Lab Work:  None Ordered  If you have labs (blood work) drawn today and your tests are completely normal, you will receive your results only by: MyChart Message (if you have MyChart) OR A paper copy in the mail If you have any lab test that is abnormal or we need to change your treatment, we will call you to review the results.   Testing/Procedures:  None Ordered   Follow-Up: At [facility], you and your health needs are our priority.  As part of our continuing mission to provide you with exceptional heart care, we have created designated Provider Care Teams.  These Care Teams include your primary Cardiologist (physician) and Advanced Practice Providers (APPs -  Physician Assistants and Nurse Practitioners) who all work together to provide you with the care you need, when you need it.  We recommend signing up for the patient portal called MyChart.  Sign up information is provided on this After Visit Summary.  MyChart is used to connect with patients for Virtual Visits (Telemedicine).  Patients are able to view lab/test results, encounter notes, upcoming appointments, etc.  Non-urgent messages can be sent to your provider as well.   To learn more about what you can do with MyChart, go to [email or web address].    Your next appointment:   3 month(s)  Provider:   Evon Lopezperez, NP

## 2024-01-01 DIAGNOSIS — I10 Essential (primary) hypertension: Secondary | ICD-10-CM | POA: Diagnosis not present

## 2024-01-23 ENCOUNTER — Telehealth: Payer: Self-pay | Admitting: *Deleted

## 2024-01-23 ENCOUNTER — Other Ambulatory Visit: Payer: Self-pay | Admitting: *Deleted

## 2024-01-23 ENCOUNTER — Telehealth: Payer: Self-pay | Admitting: Hematology and Oncology

## 2024-01-23 MED ORDER — TAMOXIFEN CITRATE 20 MG PO TABS: 20.0000 mg | ORAL_TABLET | Freq: Every day | ORAL | 0 refills | Status: DC

## 2024-01-23 NOTE — Telephone Encounter (Signed)
Received call from pt requesting refill of Tamoxifen.  Prescription sent to pt pharmacy.

## 2024-01-23 NOTE — Telephone Encounter (Signed)
Rescheduled appointment per patients request via incoming call. Patient is aware of the changes made to her upcoming appointment.

## 2024-02-05 ENCOUNTER — Other Ambulatory Visit: Payer: Self-pay | Admitting: Internal Medicine

## 2024-02-05 DIAGNOSIS — Z1231 Encounter for screening mammogram for malignant neoplasm of breast: Secondary | ICD-10-CM

## 2024-02-09 DIAGNOSIS — I10 Essential (primary) hypertension: Secondary | ICD-10-CM | POA: Diagnosis not present

## 2024-02-12 ENCOUNTER — Ambulatory Visit: Payer: Medicare HMO | Admitting: Hematology and Oncology

## 2024-02-12 ENCOUNTER — Inpatient Hospital Stay: Payer: Medicare Other | Attending: Hematology and Oncology | Admitting: Hematology and Oncology

## 2024-02-12 VITALS — BP 161/78 | HR 69 | Temp 97.4°F | Resp 16 | Ht 63.5 in | Wt 133.2 lb

## 2024-02-12 DIAGNOSIS — C50412 Malignant neoplasm of upper-outer quadrant of left female breast: Secondary | ICD-10-CM | POA: Diagnosis not present

## 2024-02-12 DIAGNOSIS — Z170 Estrogen receptor positive status [ER+]: Secondary | ICD-10-CM | POA: Diagnosis not present

## 2024-02-12 DIAGNOSIS — Z923 Personal history of irradiation: Secondary | ICD-10-CM | POA: Insufficient documentation

## 2024-02-12 DIAGNOSIS — Z853 Personal history of malignant neoplasm of breast: Secondary | ICD-10-CM | POA: Insufficient documentation

## 2024-02-12 DIAGNOSIS — Z79899 Other long term (current) drug therapy: Secondary | ICD-10-CM | POA: Diagnosis not present

## 2024-02-12 NOTE — Progress Notes (Signed)
 Patient Care Team: Rodrigo Ran, MD as PCP - General (Internal Medicine) Lanier Prude, MD as PCP - Electrophysiology (Cardiology) Serena Croissant, MD as Consulting Physician (Hematology and Oncology) Axel Filler Larna Daughters, NP as Nurse Practitioner (Hematology and Oncology) Lonie Peak, MD as Attending Physician (Radiation Oncology) Claud Kelp, MD as Consulting Physician (General Surgery)  DIAGNOSIS:  Encounter Diagnosis  Name Primary?   Malignant neoplasm of upper-outer quadrant of left breast in female, estrogen receptor positive (HCC) Yes    SUMMARY OF ONCOLOGIC HISTORY: Oncology History  Malignant neoplasm of upper-outer quadrant of left breast in female, estrogen receptor positive (HCC)  07/01/2002 Surgery   Left breast lumpectomy, stage 2 invasive ductal carcinoma with DCIS ER positive PR positive HER-2 negative, 3.4 cm tumor   07/01/2002 Pathologic Stage   Stage IIA: T2 N0   08/06/2002 - 11/06/2002 Chemotherapy   Adjuvant chemotherapy with CMF 6   12/06/2002 - 12/31/2002 Radiation Therapy   Adjuvant radiation therapy   01/29/2003 - 01/06/2009 Anti-estrogen oral therapy   Tamoxifen for 1 year then Arimidex 1 mg daily   Malignant neoplasm of upper-inner quadrant of right breast in female, estrogen receptor positive (HCC)  2003 Miscellaneous   Left breast lumpectomy, stage 2 invasive ductal carcinoma with DCIS ER positive PR positive HER-2 negative, 3.4 cm tumor, CMF x6 followed by radiation followed by tamoxifen and later anastrozole completed 2010    11/29/2017 Initial Diagnosis   Right breast biopsy 4 o'clock position: Grade 1-2 IDC with DCIS with necrosis, ER 100%, PR 100%, Ki-67 2%, HER-2 negative ratio 1.19; 7 o'clock position fibroadenoma; ultrasound detected 3 o'clock position 2 x 1.5 x 1.7 cm, T1CN0 stage I a clinical stage   01/12/2018 Surgery   Rt Lumpectomy: IDC grade 1 measuring 0.9 cm with ext DCIS, Margins Neg, ER 100%, PR 100%, Ki-67 2%, HER-2  negative ratio 1.19, T1bNx Stage 1A     03/01/2018 - 03/26/2018 Radiation Therapy   Adjuvant radiation therapy   04/2018 -  Anti-estrogen oral therapy   Could not tolerate anastrozole or letrozole. Tolerating tamoxifen     CHIEF COMPLIANT: Surveillance of breast cancer on Tamoxifen  HISTORY OF PRESENT ILLNESS:   History of Present Illness Marie Alvarez, a patient with a history of breast cancer, presents for a follow-up visit. She has been on tamoxifen for six years, one year longer than the typical course. Despite completing the recommended course, she initially expressed a desire to continue the medication due to perceived benefits for her bones. However, after discussing the risks of blood clots and potential effects on the uterus, she agreed to discontinue the medication.  In recent months, Wisor has been hospitalized due to heart issues. She has been experiencing high blood pressure, which was notably elevated during the current visit. She attributes this to stress and anxiety, particularly when visiting the doctor. She has also been having issues with her medication, including allergies to a heart medication and difficulties with her insurance and pharmacy.  Marie Alvarez values her independence and continues to drive herself to appointments. She has a strong support system, including a grandson who has moved in with her and good neighbors. Despite her health issues, she remains active and engaged in her care.     ALLERGIES:  is allergic to amoxicillin-pot clavulanate, letrozole, lisinopril, prednisone, and sulfamethoxazole.  MEDICATIONS:  Current Outpatient Medications  Medication Sig Dispense Refill   acetaminophen (TYLENOL) 650 MG CR tablet Take 650 mg by mouth as needed for pain.     amLODipine (  NORVASC) 5 MG tablet Take 5 mg by mouth daily.     cholecalciferol (VITAMIN D) 1000 units tablet Take 1,000 Units by mouth at bedtime.     doxazosin (CARDURA) 4 MG tablet Take 1 tablet (4 mg total) by  mouth daily.     estradiol (ESTRACE) 0.1 MG/GM vaginal cream Place 1 Applicatorful vaginally 3 (three) times a week. Monday, Wednesday, Friday (Patient not taking: Reported on 12/20/2023)     fexofenadine (ALLEGRA) 180 MG tablet Take 180 mg by mouth daily.     metoprolol succinate (TOPROL-XL) 50 MG 24 hr tablet Take 1 tablet (50 mg total) by mouth 2 (two) times daily. Take with or immediately following a meal. 30 tablet 2   Multiple Vitamins-Minerals (PRESERVISION AREDS 2) CAPS Take 1 tablet by mouth 2 (two) times daily.     nortriptyline (PAMELOR) 10 MG capsule Take 3 capsules (30 mg total) by mouth at bedtime. (Patient taking differently: Take 40 mg by mouth at bedtime.)     pantoprazole (PROTONIX) 40 MG tablet Take 40 mg by mouth daily. May take a second 40 mg dose as needed for acid reflux     pravastatin (PRAVACHOL) 40 MG tablet Take 40 mg by mouth at bedtime.     tamoxifen (NOLVADEX) 20 MG tablet Take 1 tablet (20 mg total) by mouth daily. 90 tablet 0   vitamin B-12 (CYANOCOBALAMIN) 1000 MCG tablet Take 1,000 mcg by mouth at bedtime.     No current facility-administered medications for this visit.    PHYSICAL EXAMINATION: ECOG PERFORMANCE STATUS: 1 - Symptomatic but completely ambulatory  There were no vitals filed for this visit. There were no vitals filed for this visit.  Physical Exam BREAST: Breasts normal  (exam performed in the presence of a chaperone)  LABORATORY DATA:  I have reviewed the data as listed    Latest Ref Rng & Units 11/22/2023    5:55 AM 11/20/2023    5:10 PM 11/01/2023   10:08 AM  CMP  Glucose 70 - 99 mg/dL 96  161  096   BUN 8 - 23 mg/dL 15  16  19    Creatinine 0.44 - 1.00 mg/dL 0.45  4.09  8.11   Sodium 135 - 145 mmol/L 137  135  137   Potassium 3.5 - 5.1 mmol/L 3.9  4.0  4.4   Chloride 98 - 111 mmol/L 107  102  108   CO2 22 - 32 mmol/L 23  21  20    Calcium 8.9 - 10.3 mg/dL 9.0  9.5  9.1     Lab Results  Component Value Date   WBC 7.4 11/22/2023    HGB 13.8 11/22/2023   HCT 40.9 11/22/2023   MCV 92.1 11/22/2023   PLT 158 11/22/2023   NEUTROABS 4.4 11/22/2023    ASSESSMENT & PLAN:  Malignant neoplasm of upper-outer quadrant of left breast in female, estrogen receptor positive (HCC) 01/12/18: Rt Lumpectomy: IDC grade 1 measuring 0.9 cm with ext DCIS, Margins Neg, ER 100%, PR 100%, Ki-67 2%, HER-2 negative ratio 1.19, T1bNx Stage 1A (07/01/2002:Left breast lumpectomy, stage 2 invasive ductal carcinoma with DCIS ER positive PR positive HER-2 negative, 3.4 cm tumor T2N0 stage IIa, received adjuvant chemo radiation and antiestrogen therapy until 2010)   Adjuvant radiation therapy: 03/01/2018-03/26/2018   Treatment plan: Adjuvant antiestrogen therapy with letrozole 2.5 mg daily started 04/11/2018, switched to anastrozole but she could not tolerate that either.  Switched to tamoxifen   Current Treatment: Tamoxifen. 20 mg daily.  Since she completed 5 years of Tamoxifen, I recommended that she stop it now   Patient son passed away 2020/03/07 from a acute coronary syndrome.     Breast cancer surveillance: 1.  Breast exam 02/12/2024 benign 2. Mammogram scheduled for 03/11/2024   RTC in 1 year ------------------------------------- Assessment and Plan Assessment & Plan Breast Cancer Completed 6 years of Tamoxifen therapy. Discussed the risks/benefits of continuing Tamoxifen (blood clots, endometrial thickening) vs stopping (potential for recurrence). Patient initially wished to continue but after discussion of risks, especially in light of recent heart issues, agreed to discontinue. -Discontinue Tamoxifen. -Inform [facility] to cancel future refills.  Hypertension Patient reports inconsistent blood pressure readings, with high readings at the doctor's office and normal readings at home. -Advise patient to continue monitoring blood pressure at home and to avoid factors that may cause anxiety prior to readings.  General Health  Maintenance -Continue annual check-ups. -Scheduled mammogram on March 11, 2024.      No orders of the defined types were placed in this encounter.  The patient has a good understanding of the overall plan. she agrees with it. she will call with any problems that may develop before the next visit here. Total time spent: 30 mins including face to face time and time spent for planning, charting and co-ordination of care   Tamsen Meek, MD 02/12/24

## 2024-02-12 NOTE — Assessment & Plan Note (Signed)
 01/12/18: Rt Lumpectomy: IDC grade 1 measuring 0.9 cm with ext DCIS, Margins Neg, ER 100%, PR 100%, Ki-67 2%, HER-2 negative ratio 1.19, T1bNx Stage 1A (07/01/2002:Left breast lumpectomy, stage 2 invasive ductal carcinoma with DCIS ER positive PR positive HER-2 negative, 3.4 cm tumor T2N0 stage IIa, received adjuvant chemo radiation and antiestrogen therapy until 19-Feb-2009)   Adjuvant radiation therapy: 03/01/2018-03/26/2018   Treatment plan: Adjuvant antiestrogen therapy with letrozole 2.5 mg daily started 04/11/2018, switched to anastrozole but she could not tolerate that either.  Switched to tamoxifen   Current Treatment: Tamoxifen. 20 mg daily. Patient is tolerating tamoxifen extremely well without any major problems.   Patient son passed away 02-20-2020 from a acute coronary syndrome.     Breast cancer surveillance: 1.  Breast exam 02/12/2024 benign 2. Mammogram scheduled for 03/11/2024   RTC in 1 year

## 2024-02-14 ENCOUNTER — Telehealth: Payer: Self-pay | Admitting: Cardiology

## 2024-02-14 ENCOUNTER — Ambulatory Visit: Attending: Cardiology | Admitting: Cardiology

## 2024-02-14 VITALS — BP 160/104 | HR 77 | Ht 63.0 in | Wt 132.6 lb

## 2024-02-14 DIAGNOSIS — I10 Essential (primary) hypertension: Secondary | ICD-10-CM

## 2024-02-14 DIAGNOSIS — R000 Tachycardia, unspecified: Secondary | ICD-10-CM | POA: Diagnosis not present

## 2024-02-14 DIAGNOSIS — I4719 Other supraventricular tachycardia: Secondary | ICD-10-CM

## 2024-02-14 NOTE — Patient Instructions (Signed)
 Instructions:   You have orthostatic hypotension. This means that your blood pressure drops with position changes and takes a couple minutes to recover.      Follow-Up: At [facility], you and your health needs are our priority.  As part of our continuing mission to provide you with exceptional heart care, we have created designated Provider Care Teams.  These Care Teams include your primary Cardiologist (physician) and Advanced Practice Providers (APPs -  Physician Assistants and Nurse Practitioners) who all work together to provide you with the care you need, when you need it.  We recommend signing up for the patient portal called "MyChart".  Sign up information is provided on this After Visit Summary.  MyChart is used to connect with patients for Virtual Visits (Telemedicine).  Patients are able to view lab/test results, encounter notes, upcoming appointments, etc.  Non-urgent messages can be sent to your provider as well.   To learn more about what you can do with MyChart, go to [email or web address].    Your next appointment:   6 month(s)  Provider:   Steffanie Dunn, MD or Sherie Don, NP

## 2024-02-14 NOTE — Telephone Encounter (Signed)
 Pt c/o Shortness Of Breath: STAT if SOB developed within the last 24 hours or pt is noticeably SOB on the phone  1. Are you currently SOB (can you hear that pt is SOB on the phone)? no  2. How long have you been experiencing SOB? Since late Dec. It comes and goes. Does not have it all the time  3. Are you SOB when sitting or when up moving around? Both  4. Are you currently experiencing any other symptoms? lightheaded

## 2024-02-14 NOTE — Progress Notes (Signed)
 Electrophysiology Clinic Note    Date:  02/14/2024  Patient ID:  Marie Alvarez, Marie Alvarez Aug 08, 1931, MRN [id number] PCP:  Rodrigo Ran, MD  Cardiologist:  None Electrophysiologist: Lanier Prude, MD    Discussed the use of AI scribe software for clinical note transcription with the patient, who gave verbal consent to proceed.   Patient Profile    Chief Complaint: dizziness, LH  History of Present Illness: Marie Alvarez is a 89 y.o. female with PMH notable for wide-complex tachycardia, HTN, HLD, Breast Ca; seen today for Lanier Prude, MD for acute visit for dizziness, SOB.   She presented to ER 11/01/23 in a wide-complex tachycardia going 150bpm. Her PCP had recently adjusted some BP medications d/t hypotension. She was given adenosine in the ER > several sinus beats, then atrial tach with a narrow QRS. She then spontaneously converted to sinus rhythm and was discharged.   She last saw Dr. Lalla Brothers 11/08/2023 for EP consult. No further tachycardic episodes, but she was very dizzy - he thought likely vertigo. He had recommended a TTE and 1 wk zio to further eval.  She re-presented to ER 12/10 with recurrent tachycardia. She was in sinus tach at that time up to 110s, BB increased at discharge. I saw her 12/2023 at which time she had not had any further tachycardia or palpation episodes. She was having hypertension at home.   She called clinic earlier today with complaints of ongoing dizziness, LH, and weakness. She diligently checks her BP several times a week, readings are consistently 150-170 systolic by home readings. She denies chest pain, chest pressure, palpitations, SOB. She frequently has dizziness with position changes, especially when she gets up in the middle of the night to go to bathroom.    AAD History: None     ROS:  Please see the history of present illness. All other systems are reviewed and otherwise negative.    Physical Exam    VS:  BP (!) 160/104 (BP  Location: Right Arm, Patient Position: Sitting, Cuff Size: Normal)   Pulse 77   Ht 5\' 3"  (1.6 m)   Wt 132 lb 9.6 oz (60.1 kg)   SpO2 94%   BMI 23.49 kg/m  BMI: Body mass index is 23.49 kg/m.  Orthostatic VS for the past 24 hrs (Last 3 readings):  BP- Lying Pulse- Lying BP- Sitting Pulse- Sitting BP- Standing at 0 minutes Pulse- Standing at 0 minutes BP- Standing at 3 minutes Pulse- Standing at 3 minutes  02/14/24 1437 161/79 75 153/78 75 136/82 79 162/84 78     Wt Readings from Last 3 Encounters:  02/14/24 132 lb 9.6 oz (60.1 kg)  02/12/24 133 lb 3.2 oz (60.4 kg)  12/20/23 132 lb 6 oz (60 kg)     GEN- The patient is well appearing, alert and oriented x 3 today.  Appears anxious Lungs- Clear to ausculation bilaterally, normal work of breathing.  Heart- Regular rate and rhythm, no murmurs, rubs or gallops Extremities- 1+ peripheral edema, warm, dry    Studies Reviewed   Previous EP, cardiology notes.    EKG is ordered. Personal review of EKG from today shows:    EKG Interpretation Date/Time:  Wednesday February 14 2024 14:29:00 EST Ventricular Rate:  73 PR Interval:  154 QRS Duration:  80 QT Interval:  390 QTC Calculation: 429 R Axis:   -19  Text Interpretation: Normal sinus rhythm Confirmed by Sherie Don [phone]) on 02/14/2024 2:48:10 PM  Long term monitor, 11/08/2023 Heart rate 59-190, average 85 Rare supraventricular and ventricular ectopy No atrial fibrillation 1 nonsustained VT, 4 beats 6 nonsustained SVT, longest 18.1 seconds No sustained arrhythmias  TTE, 11/21/2023  1. Left ventricular ejection fraction, by estimation, is 55 to 60%. The left ventricle has normal function. The left ventricle has no regional wall motion abnormalities. Left ventricular diastolic parameters are consistent with Grade I diastolic dysfunction (impaired relaxation).   2. Right ventricular systolic function is normal. The right ventricular size is normal. There is normal pulmonary  artery systolic pressure.   3. The mitral valve is degenerative. Trivial mitral valve regurgitation. No evidence of mitral stenosis.   4. The aortic valve is tricuspid. Aortic valve regurgitation is not visualized. No aortic stenosis is present.   5. The inferior vena cava is normal in size with greater than 50% respiratory variability, suggesting right atrial pressure of 3 mmHg.      Assessment and Plan    #) HTN #) orthostatic hypotension Orthostatic vitals positive today with a > drop in BP with standing, but quickly recovers with standing at 3 minutes Extensive education regarding treating HTN with orthostatic hypotension Strongly recommended to change positions closely, allowing BP to adjust prior to walking Stay well-hydrated Continue 300mg  irbesartan, 5mg  amlodipine, 50mg  toprol BID for now.  If activity modifications do not improve orthostatic symptoms, will need to reduce medications Continue close follow-up with PCP for ongoing mgmt  #) wide-complex tachycardia #) atach Well-controlled currently without palpitation episodes Continue 50mg  toprol as above     Current medicines are reviewed at length with the patient today.   The patient does not have concerns regarding her medicines.  The following changes were made today:  none  Labs/ tests ordered today include:  Orders Placed This Encounter  Procedures   EKG 12-Lead     Disposition: Follow up with Dr. Lalla Brothers or EP APP in 3 months   Signed, Sherie Don, NP  02/14/24  4:39 PM  Electrophysiology [facility]

## 2024-02-14 NOTE — Telephone Encounter (Signed)
 Spoke with pt regarding sob and lightheadedness. Pt stated that she has been having sob and lightheadedness on and off since December mostly with activity. Pt stated she also has chest pain occasionally with activity. Pt reports no swelling in her lower extremities, but she is experiencing weakness. Pt takes her BP and HR regularly and states that her HR has been in the 70s and her BP this morning was 152/65, but has been as high as 186/60 this week. Pt agreed to being scheduled for an appointment this afternoon with Sherie Don, NP at our [place] location.

## 2024-02-19 ENCOUNTER — Other Ambulatory Visit: Payer: Self-pay | Admitting: Hematology and Oncology

## 2024-02-22 DIAGNOSIS — R7301 Impaired fasting glucose: Secondary | ICD-10-CM | POA: Diagnosis not present

## 2024-02-22 DIAGNOSIS — I10 Essential (primary) hypertension: Secondary | ICD-10-CM | POA: Diagnosis not present

## 2024-02-22 DIAGNOSIS — I479 Paroxysmal tachycardia, unspecified: Secondary | ICD-10-CM | POA: Diagnosis not present

## 2024-02-22 DIAGNOSIS — N390 Urinary tract infection, site not specified: Secondary | ICD-10-CM | POA: Diagnosis not present

## 2024-02-28 ENCOUNTER — Ambulatory Visit: Payer: Self-pay | Admitting: Hematology and Oncology

## 2024-02-29 DIAGNOSIS — Z4689 Encounter for fitting and adjustment of other specified devices: Secondary | ICD-10-CM | POA: Diagnosis not present

## 2024-02-29 DIAGNOSIS — N952 Postmenopausal atrophic vaginitis: Secondary | ICD-10-CM | POA: Diagnosis not present

## 2024-03-06 DIAGNOSIS — N390 Urinary tract infection, site not specified: Secondary | ICD-10-CM | POA: Diagnosis not present

## 2024-03-11 ENCOUNTER — Ambulatory Visit
Admission: RE | Admit: 2024-03-11 | Discharge: 2024-03-11 | Disposition: A | Discharge status: Home / Self Care | Payer: Medicare Other | Source: Ambulatory Visit | Attending: Internal Medicine | Admitting: Internal Medicine

## 2024-03-11 DIAGNOSIS — Z1231 Encounter for screening mammogram for malignant neoplasm of breast: Secondary | ICD-10-CM | POA: Diagnosis not present

## 2024-03-18 DIAGNOSIS — L821 Other seborrheic keratosis: Secondary | ICD-10-CM | POA: Diagnosis not present

## 2024-03-18 DIAGNOSIS — Z85828 Personal history of other malignant neoplasm of skin: Secondary | ICD-10-CM | POA: Diagnosis not present

## 2024-03-18 DIAGNOSIS — D692 Other nonthrombocytopenic purpura: Secondary | ICD-10-CM | POA: Diagnosis not present

## 2024-03-18 DIAGNOSIS — L905 Scar conditions and fibrosis of skin: Secondary | ICD-10-CM | POA: Diagnosis not present

## 2024-03-18 DIAGNOSIS — L570 Actinic keratosis: Secondary | ICD-10-CM | POA: Diagnosis not present

## 2024-03-20 ENCOUNTER — Ambulatory Visit: Payer: Medicare Other | Admitting: Cardiology

## 2024-05-09 DIAGNOSIS — R350 Frequency of micturition: Secondary | ICD-10-CM | POA: Diagnosis not present

## 2024-05-09 DIAGNOSIS — N390 Urinary tract infection, site not specified: Secondary | ICD-10-CM | POA: Diagnosis not present

## 2024-05-27 DIAGNOSIS — I10 Essential (primary) hypertension: Secondary | ICD-10-CM | POA: Diagnosis not present

## 2024-07-30 DIAGNOSIS — H9190 Unspecified hearing loss, unspecified ear: Secondary | ICD-10-CM | POA: Diagnosis not present

## 2024-07-30 DIAGNOSIS — J329 Chronic sinusitis, unspecified: Secondary | ICD-10-CM | POA: Diagnosis not present

## 2024-08-05 ENCOUNTER — Telehealth: Payer: Self-pay | Admitting: Cardiology

## 2024-08-05 ENCOUNTER — Emergency Department (HOSPITAL_BASED_OUTPATIENT_CLINIC_OR_DEPARTMENT_OTHER): Admitting: Radiology

## 2024-08-05 ENCOUNTER — Emergency Department (HOSPITAL_BASED_OUTPATIENT_CLINIC_OR_DEPARTMENT_OTHER)
Admission: EM | Admit: 2024-08-05 | Discharge: 2024-08-05 | Disposition: A | Discharge status: Home / Self Care | Attending: Emergency Medicine | Admitting: Emergency Medicine

## 2024-08-05 ENCOUNTER — Other Ambulatory Visit: Payer: Self-pay

## 2024-08-05 ENCOUNTER — Emergency Department (HOSPITAL_BASED_OUTPATIENT_CLINIC_OR_DEPARTMENT_OTHER)

## 2024-08-05 DIAGNOSIS — R000 Tachycardia, unspecified: Secondary | ICD-10-CM | POA: Diagnosis not present

## 2024-08-05 DIAGNOSIS — R002 Palpitations: Secondary | ICD-10-CM | POA: Insufficient documentation

## 2024-08-05 DIAGNOSIS — I10 Essential (primary) hypertension: Secondary | ICD-10-CM | POA: Insufficient documentation

## 2024-08-05 DIAGNOSIS — R7989 Other specified abnormal findings of blood chemistry: Secondary | ICD-10-CM | POA: Insufficient documentation

## 2024-08-05 LAB — CBC
HCT: 42 % (ref 36.0–46.0)
Hemoglobin: 14.1 g/dL (ref 12.0–15.0)
MCH: 31.4 pg (ref 26.0–34.0)
MCHC: 33.6 g/dL (ref 30.0–36.0)
MCV: 93.5 fL (ref 80.0–100.0)
Platelets: 165 K/uL (ref 150–400)
RBC: 4.49 MIL/uL (ref 3.87–5.11)
RDW: 12.8 % (ref 11.5–15.5)
WBC: 8.5 K/uL (ref 4.0–10.5)
nRBC: 0 % (ref 0.0–0.2)

## 2024-08-05 LAB — TROPONIN T, HIGH SENSITIVITY
Troponin T High Sensitivity: 79 ng/L — ABNORMAL HIGH (ref 0–19)
Troponin T High Sensitivity: 60 ng/L — ABNORMAL HIGH (ref 0–19)

## 2024-08-05 LAB — BASIC METABOLIC PANEL WITH GFR
Anion gap: 11 (ref 5–15)
BUN: 18 mg/dL (ref 8–23)
CO2: 22 mmol/L (ref 22–32)
Calcium: 9.9 mg/dL (ref 8.9–10.3)
Chloride: 105 mmol/L (ref 98–111)
Creatinine, Ser: 0.75 mg/dL (ref 0.44–1.00)
GFR, Estimated: >60 mL/min (ref >60)
Glucose, Bld: 121 mg/dL — ABNORMAL HIGH (ref 70–99)
Potassium: 4.3 mmol/L (ref 3.5–5.1)
Sodium: 137 mmol/L (ref 135–145)

## 2024-08-05 MED ORDER — SODIUM CHLORIDE 0.9 % IV BOLUS
500.0000 mL | Freq: Once | INTRAVENOUS | Status: AC
  Administered 2024-08-05: 500 mL via INTRAVENOUS

## 2024-08-05 NOTE — ED Triage Notes (Signed)
 Pt POV reporting tachycardia after doing chores around the house, states she may just be worked up because family is due to visit. Hx paroxsymal tachycardia and anxiety. Denies CP or SOB.

## 2024-08-05 NOTE — Discharge Instructions (Signed)
 As we discussed your heart enzyme, troponin was elevated, however in context of your heart rate being quite elevated earlier today at 170 I suspect it was secondary to your heart working harder than it is normally used to.  Your heart enzyme was decreasing when we rechecked it, and I feel comfortable with you going home today, I would call your primary care doctor and cardiologist in the morning to let them know that your heart return to normal rhythm and that your troponin was downtrending, and ask if they would like to see you for any additional follow-up.  If you have a return of your elevated heart rate, develop chest pain, shortness of breath please return to the emergency department for further evaluation.  It was a pleasure taking care of you today.

## 2024-08-05 NOTE — ED Provider Notes (Signed)
 [place] EMERGENCY DEPARTMENT AT [facility] Provider Note   CSN: [id number] Arrival date & time: 08/05/24  1726     Patient presents with: Palpitations   Marie Alvarez is a 89 y.o. female with past medical history seen for hypertension, hyperlipidemia, symptomatic tachycardia, atrial tachycardia, postural dizziness, presyncope who presents with concern for high heart rate, palpitations after doing chores around the house, she reports symptoms lasted for few hours.  She reports that symptoms have now resolved.    Palpitations      Prior to Admission medications   Medication Sig Start Date End Date Taking? Authorizing Provider  acetaminophen  (TYLENOL ) 650 MG CR tablet Take 650 mg by mouth as needed for pain.    [provider]  amLODipine  (NORVASC ) 5 MG tablet Take 5 mg by mouth daily.    [provider]  cholecalciferol (VITAMIN D ) 1000 units tablet Take 1,000 Units by mouth at bedtime.    [provider]  doxazosin  (CARDURA ) 4 MG tablet Take 1 tablet (4 mg total) by mouth daily. Patient not taking: Reported on 02/14/2024 12/26/18   Gudena, Vinay, MD  estradiol (ESTRACE) 0.1 MG/GM vaginal cream Place 1 Applicatorful vaginally 3 (three) times a week. Monday, Wednesday, Friday Patient not taking: Reported on 02/14/2024    [provider]  fexofenadine (ALLEGRA) 180 MG tablet Take 180 mg by mouth daily.    [provider]  irbesartan (AVAPRO) 150 MG tablet Take 150 mg by mouth daily.    [provider]  metoprolol  succinate (TOPROL -XL) 50 MG 24 hr tablet Take 1 tablet (50 mg total) by mouth 2 (two) times daily. Take with or immediately following a meal. 11/23/23   Fausto Burnard LABOR, DO  Multiple Vitamins-Minerals (PRESERVISION AREDS 2) CAPS Take 1 tablet by mouth 2 (two) times daily. 04/29/20   [provider]  nortriptyline  (PAMELOR ) 10 MG capsule Take 3 capsules (30 mg total) by mouth at bedtime. Patient not taking:  Reported on 02/14/2024 02/02/22   Gudena, Vinay, MD  pantoprazole (PROTONIX) 40 MG tablet Take 40 mg by mouth daily. May take a second 40 mg dose as needed for acid reflux Patient not taking: Reported on 02/14/2024    [provider]  pravastatin  (PRAVACHOL ) 40 MG tablet Take 40 mg by mouth at bedtime.    [provider]  propranolol (INDERAL) 10 MG tablet 10 mg.    [provider]  tamoxifen  (NOLVADEX ) 20 MG tablet TAKE 1 TABLET BY MOUTH EVERY DAY 02/19/24   Gudena, Vinay, MD  vitamin B-12 (CYANOCOBALAMIN ) 1000 MCG tablet Take 1,000 mcg by mouth at bedtime.    [provider]    Allergies: Amoxicillin -pot clavulanate, Letrozole , Lisinopril, Prednisone, and Sulfamethoxazole    Review of Systems  Cardiovascular:  Positive for palpitations.  All other systems reviewed and are negative.   Updated Vital Signs BP (!) 164/78   Pulse 88   Temp 98.1 F (36.7 C)   Resp 15   Ht 5' 3 (1.6 m)   Wt 59 kg   SpO2 99%   BMI 23.03 kg/m   Physical Exam Vitals and nursing note reviewed.  Constitutional:      General: She is not in acute distress.    Appearance: Normal appearance.  HENT:     Head: Normocephalic and atraumatic.  Eyes:     General:        Right eye: No discharge.        Left eye: No discharge.  Cardiovascular:  Rate and Rhythm: Regular rhythm. Tachycardia present.     Heart sounds: No murmur heard.    No friction rub. No gallop.     Comments: Mild tachycardia with normal rhythm Pulmonary:     Effort: Pulmonary effort is normal.     Breath sounds: Normal breath sounds.     Comments: No wheezing, rhonchi, or, rales. Abdominal:     General: Bowel sounds are normal.     Palpations: Abdomen is soft.  Skin:    General: Skin is warm and dry.     Capillary Refill: Capillary refill takes less than 2 seconds.  Neurological:     Mental Status: She is alert and oriented to person, place, and time.  Psychiatric:        Mood and Affect: Mood  normal.        Behavior: Behavior normal.     (all labs ordered are listed, but only abnormal results are displayed) Labs Reviewed  BASIC METABOLIC PANEL WITH GFR - Abnormal; Notable for the following components:      Result Value   Glucose, Bld 121 (*)    All other components within normal limits  TROPONIN T, HIGH SENSITIVITY - Abnormal; Notable for the following components:   Troponin T High Sensitivity 79 (*)    All other components within normal limits  TROPONIN T, HIGH SENSITIVITY - Abnormal; Notable for the following components:   Troponin T High Sensitivity 60 (*)    All other components within normal limits  CBC    EKG: EKG Interpretation Date/Time:  Monday August 05 2024 17:57:00 EDT Ventricular Rate:  101 PR Interval:  158 QRS Duration:  76 QT Interval:  328 QTC Calculation: 425 R Axis:   10  Text Interpretation: Sinus tachycardia Otherwise normal ECG When compared with ECG of 14-Feb-2024 14:29, Criteria for Anterior infarct are no longer Present Confirmed by Jerrol Agent ([id number]) on 08/05/2024 8:16:40 PM  Radiology: DG Chest 2 View Result Date: 08/05/2024 CLINICAL DATA:  Palpitations EXAM: CHEST - 2 VIEW COMPARISON:  11/21/2023 FINDINGS: Rotated examination. The heart size and mediastinal contours are within normal limits. Both lungs are clear. Disc degenerative disease of the thoracic spine. IMPRESSION: No acute abnormality of the lungs. Electronically Signed   By: Marolyn JONETTA Jaksch M.D.   On: 08/05/2024 19:14     Procedures   Medications Ordered in the ED  sodium chloride  0.9 % bolus 500 mL (0 mLs Intravenous Stopped 08/05/24 2234)                                    Medical Decision Making Amount and/or Complexity of Data Reviewed Labs: ordered. Radiology: ordered.   This patient is a 89 y.o. female  who presents to the ED for concern of palpitations.   Differential diagnoses prior to evaluation: The emergent differential diagnosis includes, but is not  limited to,  unstable tachydysrhythmia, unstable bradycardia, sick sinus syndrome, heart block or other abnormal electrophysiologic abnormality including WPW, LGL, brugada, SVT, afib, a flutter, vs other  . This is not an exhaustive differential.   Past Medical History / Co-morbidities / Social History: hypertension, hyperlipidemia, symptomatic tachycardia, atrial tachycardia, postural dizziness, presyncope  Additional history: Chart reviewed. Pertinent results include: Reviewed previous cardiology visits, PCP visits, known history of paroxysmal tachycardia.  Physical Exam: Physical exam performed. The pertinent findings include: Initially with mild tachycardia, normal rhythm, improved on recheck after fluid bolus.  She is some hypertension, blood pressure 154/76 at time of arrival, 164/78 at time of discharge.  Afebrile.  Vital signs otherwise stable.  Lab Tests/Imaging studies: I personally interpreted labs/imaging and the pertinent results include: CBC unremarkable, BMP unremarkable, mildly elevated glucose of 121 for nonfasting lab values.  Initial troponin is elevated at 76, up with delta at 60.  Patient is chest pain-free, and has had resolution of her paroxysmal tachycardia since ED arrival.  Suspect demand ischemia.  I independently interpreted plain film chest x-ray which shows no evidence of acute intrathoracic abnormality.  I agree with the radiologist interpretation.  Cardiac monitoring: EKG obtained and interpreted by myself and attending physician which shows: Sinus tachycardia, no acute ST ST changes.  Patient was on the cardiac monitor for the duration of her visit, did not have any episodes of paroxysmal tachycardia and tachycardia resolved prior to discharge.   Medications: I ordered medication including 500 cc fluid bolus for tachycardia.  I have reviewed the patients home medicines and have made adjustments as needed.   Disposition: After consideration of the diagnostic  results and the patients response to treatment, I feel that patient with reported episode of proximal tachycardia prior to arrival, troponin initially elevated, with delta downtrending, suspect secondary to demand ischemia, patient is feeling improved, requests discharge, I think this is reasonable, she can follow-up closely with her PCP, cardiologist, extensive return precautions given..   emergency department workup does not suggest an emergent condition requiring admission or immediate intervention beyond what has been performed at this time. The plan is: as above. The patient is safe for discharge and has been instructed to return immediately for worsening symptoms, change in symptoms or any other concerns.   Final diagnoses:  Palpitations  Elevated troponin    ED Discharge Orders     None          Rosan Sherlean VEAR DEVONNA 08/05/24 2341    Jerrol Agent, MD 08/07/24 1200

## 2024-08-05 NOTE — Telephone Encounter (Signed)
 Patient is going to ED at [place] location. Advised to call us  back if needed.

## 2024-08-05 NOTE — Telephone Encounter (Signed)
 STAT if HR is under 50 or over 120  (normal HR is 60-100 beats per minute)  What is your heart rate? 119 - while on the phone   Do you have a log of your heart rate readings (document readings)? Today: 141, 129, 119   Do you have any other symptoms? Lightheaded earlier not currently.   Her PCP told her to go to ED but she would like to know what Beec[place], NP suggestions are first.

## 2024-08-06 NOTE — Telephone Encounter (Signed)
 Pt called back in asking if her ED notes can be reviewed and see what she needs to do moving forward.

## 2024-08-06 NOTE — Telephone Encounter (Signed)
 Patient was recently seen in the ED on 08/05/24 for palpitations and is requesting MD review of the ED notes. According to the ED visit, the ER physician recommended follow-up with cardiologist. Appointment  scheduled for 08/14/24 with Dr. Cindie to review the ED notes and reassess symptoms.

## 2024-08-13 NOTE — Progress Notes (Unsigned)
  Electrophysiology Office Follow up Visit Note:    Date:  08/14/2024   ID:  Marie Alvarez, DOB [birth year], MRN [id number]  PCP:  Shayne Anes, MD  [facility] Cardiologist:  None  [facility] Electrophysiologist:  OLE ONEIDA HOLTS, MD    Interval History:     Marie Alvarez is a 89 y.o. female who presents for a follow up visit.    The patient last saw Elvie February 14, 2024.  She has a history of atrial tachycardia on metoprolol  succinate.  She had a visit to the emergency department on August 05, 2024.  By the time she was seen in the emergency department her symptoms had resolved.  She is currently prescribed metoprolol  succinate 50 mg by mouth twice daily  She is with her sister today in clinic.  She is doing well.  She reports that when she gets highly emotional, her heart rate will become elevated.  If she lays down for a few minutes it will resolve.  She continues to take metoprolol  succinate 50 mg by mouth twice daily.       Past medical, surgical, social and family history were reviewed.  ROS:   Please see the history of present illness.    All other systems reviewed and are negative.  EKGs/Labs/Other Studies Reviewed:    The following studies were reviewed today:     EKG Interpretation Date/Time:  Wednesday August 14 2024 15:01:06 EDT Ventricular Rate:  101 PR Interval:  174 QRS Duration:  78 QT Interval:  336 QTC Calculation: 435 R Axis:   -20  Text Interpretation: Sinus tachycardia Confirmed by HOLTS OLE [phone]) on 08/14/2024 3:03:36 PM    Physical Exam:    VS:  BP 130/70 (BP Location: Left Arm, Patient Position: Sitting, Cuff Size: Normal)   Pulse (!) 101   Ht 5' 3 (1.6 m)   Wt 136 lb (61.7 kg)   SpO2 98%   BMI 24.09 kg/m     Wt Readings from Last 3 Encounters:  08/14/24 136 lb (61.7 kg)  08/05/24 130 lb (59 kg)  02/14/24 132 lb 9.6 oz (60.1 kg)     GEN: no distress CARD: RRR, No MRG RESP: No IWOB. CTAB.      ASSESSMENT:     1. Atrial tachycardia (HCC)   2. Primary hypertension    PLAN:    In order of problems listed above:  #Atrial tachycardia #Palpitations Recent ER visit for symptomatic palpitations that lasted for a couple of hours. Continue metoprolol  succinate 50 mg by mouth twice daily  #Hypertension At goal today.  Recommend checking blood pressures 1-2 times per week at home and recording the values.  Recommend bringing these recordings to the primary care physician.   Follow-up 1 year with the EP APP   Signed, OLE HOLTS, MD, [facility], [facility] 08/14/2024 3:11 PM    Electrophysiology [facility]

## 2024-08-14 ENCOUNTER — Encounter: Payer: Self-pay | Admitting: Cardiology

## 2024-08-14 ENCOUNTER — Ambulatory Visit: Attending: Cardiology | Admitting: Cardiology

## 2024-08-14 VITALS — BP 130/70 | HR 101 | Ht 63.0 in | Wt 136.0 lb

## 2024-08-14 DIAGNOSIS — I4719 Other supraventricular tachycardia: Secondary | ICD-10-CM | POA: Diagnosis not present

## 2024-08-14 DIAGNOSIS — I10 Essential (primary) hypertension: Secondary | ICD-10-CM

## 2024-08-14 NOTE — Patient Instructions (Signed)

## 2024-09-03 ENCOUNTER — Emergency Department (HOSPITAL_COMMUNITY)

## 2024-09-03 ENCOUNTER — Inpatient Hospital Stay (HOSPITAL_COMMUNITY)

## 2024-09-03 ENCOUNTER — Inpatient Hospital Stay (HOSPITAL_COMMUNITY)
Admission: EM | Admit: 2024-09-03 | Discharge: 2024-09-11 | DRG: 083 | Disposition: A | Discharge status: Rehabilitation Facility | Attending: Emergency Medicine | Admitting: Emergency Medicine

## 2024-09-03 ENCOUNTER — Other Ambulatory Visit: Payer: Self-pay

## 2024-09-03 ENCOUNTER — Encounter (HOSPITAL_COMMUNITY): Payer: Self-pay

## 2024-09-03 DIAGNOSIS — H02401 Unspecified ptosis of right eyelid: Secondary | ICD-10-CM | POA: Diagnosis present

## 2024-09-03 DIAGNOSIS — S80212A Abrasion, left knee, initial encounter: Secondary | ICD-10-CM | POA: Diagnosis present

## 2024-09-03 DIAGNOSIS — Y9301 Activity, walking, marching and hiking: Secondary | ICD-10-CM | POA: Diagnosis present

## 2024-09-03 DIAGNOSIS — Z923 Personal history of irradiation: Secondary | ICD-10-CM

## 2024-09-03 DIAGNOSIS — S30811A Abrasion of abdominal wall, initial encounter: Secondary | ICD-10-CM | POA: Diagnosis present

## 2024-09-03 DIAGNOSIS — M19012 Primary osteoarthritis, left shoulder: Secondary | ICD-10-CM | POA: Diagnosis not present

## 2024-09-03 DIAGNOSIS — H9190 Unspecified hearing loss, unspecified ear: Secondary | ICD-10-CM | POA: Diagnosis present

## 2024-09-03 DIAGNOSIS — S52201A Unspecified fracture of shaft of right ulna, initial encounter for closed fracture: Secondary | ICD-10-CM | POA: Diagnosis present

## 2024-09-03 DIAGNOSIS — D259 Leiomyoma of uterus, unspecified: Secondary | ICD-10-CM | POA: Diagnosis not present

## 2024-09-03 DIAGNOSIS — Z888 Allergy status to other drugs, medicaments and biological substances status: Secondary | ICD-10-CM

## 2024-09-03 DIAGNOSIS — M419 Scoliosis, unspecified: Secondary | ICD-10-CM | POA: Diagnosis present

## 2024-09-03 DIAGNOSIS — I10 Essential (primary) hypertension: Secondary | ICD-10-CM | POA: Diagnosis not present

## 2024-09-03 DIAGNOSIS — S022XXA Fracture of nasal bones, initial encounter for closed fracture: Secondary | ICD-10-CM | POA: Diagnosis not present

## 2024-09-03 DIAGNOSIS — S42352A Displaced comminuted fracture of shaft of humerus, left arm, initial encounter for closed fracture: Secondary | ICD-10-CM | POA: Diagnosis not present

## 2024-09-03 DIAGNOSIS — E785 Hyperlipidemia, unspecified: Secondary | ICD-10-CM | POA: Diagnosis present

## 2024-09-03 DIAGNOSIS — D62 Acute posthemorrhagic anemia: Secondary | ICD-10-CM | POA: Diagnosis not present

## 2024-09-03 DIAGNOSIS — R7401 Elevation of levels of liver transaminase levels: Secondary | ICD-10-CM | POA: Diagnosis present

## 2024-09-03 DIAGNOSIS — Z79899 Other long term (current) drug therapy: Secondary | ICD-10-CM

## 2024-09-03 DIAGNOSIS — Z8616 Personal history of COVID-19: Secondary | ICD-10-CM

## 2024-09-03 DIAGNOSIS — T1490XA Injury, unspecified, initial encounter: Secondary | ICD-10-CM | POA: Diagnosis not present

## 2024-09-03 DIAGNOSIS — R7989 Other specified abnormal findings of blood chemistry: Secondary | ICD-10-CM | POA: Diagnosis present

## 2024-09-03 DIAGNOSIS — S065X0A Traumatic subdural hemorrhage without loss of consciousness, initial encounter: Secondary | ICD-10-CM | POA: Diagnosis not present

## 2024-09-03 DIAGNOSIS — S0181XA Laceration without foreign body of other part of head, initial encounter: Secondary | ICD-10-CM | POA: Diagnosis present

## 2024-09-03 DIAGNOSIS — S82832A Other fracture of upper and lower end of left fibula, initial encounter for closed fracture: Secondary | ICD-10-CM | POA: Diagnosis present

## 2024-09-03 DIAGNOSIS — S8262XD Displaced fracture of lateral malleolus of left fibula, subsequent encounter for closed fracture with routine healing: Secondary | ICD-10-CM | POA: Diagnosis not present

## 2024-09-03 DIAGNOSIS — S4991XA Unspecified injury of right shoulder and upper arm, initial encounter: Secondary | ICD-10-CM | POA: Diagnosis not present

## 2024-09-03 DIAGNOSIS — S52601D Unspecified fracture of lower end of right ulna, subsequent encounter for closed fracture with routine healing: Secondary | ICD-10-CM | POA: Diagnosis not present

## 2024-09-03 DIAGNOSIS — D509 Iron deficiency anemia, unspecified: Secondary | ICD-10-CM | POA: Diagnosis not present

## 2024-09-03 DIAGNOSIS — Z8619 Personal history of other infectious and parasitic diseases: Secondary | ICD-10-CM

## 2024-09-03 DIAGNOSIS — S3993XA Unspecified injury of pelvis, initial encounter: Secondary | ICD-10-CM | POA: Diagnosis not present

## 2024-09-03 DIAGNOSIS — N200 Calculus of kidney: Secondary | ICD-10-CM | POA: Diagnosis present

## 2024-09-03 DIAGNOSIS — D696 Thrombocytopenia, unspecified: Secondary | ICD-10-CM | POA: Diagnosis not present

## 2024-09-03 DIAGNOSIS — S0990XA Unspecified injury of head, initial encounter: Secondary | ICD-10-CM | POA: Diagnosis not present

## 2024-09-03 DIAGNOSIS — T07XXXA Unspecified multiple injuries, initial encounter: Secondary | ICD-10-CM

## 2024-09-03 DIAGNOSIS — S92514A Nondisplaced fracture of proximal phalanx of right lesser toe(s), initial encounter for closed fracture: Secondary | ICD-10-CM | POA: Diagnosis not present

## 2024-09-03 DIAGNOSIS — S0121XA Laceration without foreign body of nose, initial encounter: Secondary | ICD-10-CM | POA: Diagnosis not present

## 2024-09-03 DIAGNOSIS — Z9089 Acquired absence of other organs: Secondary | ICD-10-CM

## 2024-09-03 DIAGNOSIS — S82452A Displaced comminuted fracture of shaft of left fibula, initial encounter for closed fracture: Secondary | ICD-10-CM | POA: Diagnosis not present

## 2024-09-03 DIAGNOSIS — K219 Gastro-esophageal reflux disease without esophagitis: Secondary | ICD-10-CM | POA: Diagnosis not present

## 2024-09-03 DIAGNOSIS — E871 Hypo-osmolality and hyponatremia: Secondary | ICD-10-CM | POA: Diagnosis not present

## 2024-09-03 DIAGNOSIS — S6991XA Unspecified injury of right wrist, hand and finger(s), initial encounter: Secondary | ICD-10-CM | POA: Diagnosis not present

## 2024-09-03 DIAGNOSIS — R338 Other retention of urine: Secondary | ICD-10-CM | POA: Insufficient documentation

## 2024-09-03 DIAGNOSIS — S42292A Other displaced fracture of upper end of left humerus, initial encounter for closed fracture: Secondary | ICD-10-CM | POA: Diagnosis not present

## 2024-09-03 DIAGNOSIS — Z9049 Acquired absence of other specified parts of digestive tract: Secondary | ICD-10-CM

## 2024-09-03 DIAGNOSIS — Z9221 Personal history of antineoplastic chemotherapy: Secondary | ICD-10-CM

## 2024-09-03 DIAGNOSIS — R1310 Dysphagia, unspecified: Secondary | ICD-10-CM | POA: Diagnosis not present

## 2024-09-03 DIAGNOSIS — M19011 Primary osteoarthritis, right shoulder: Secondary | ICD-10-CM | POA: Diagnosis not present

## 2024-09-03 DIAGNOSIS — S42212A Unspecified displaced fracture of surgical neck of left humerus, initial encounter for closed fracture: Secondary | ICD-10-CM | POA: Diagnosis not present

## 2024-09-03 DIAGNOSIS — I493 Ventricular premature depolarization: Secondary | ICD-10-CM | POA: Diagnosis not present

## 2024-09-03 DIAGNOSIS — I4719 Other supraventricular tachycardia: Secondary | ICD-10-CM | POA: Diagnosis present

## 2024-09-03 DIAGNOSIS — T83031A Leakage of indwelling urethral catheter, initial encounter: Secondary | ICD-10-CM | POA: Diagnosis not present

## 2024-09-03 DIAGNOSIS — S90414A Abrasion, right lesser toe(s), initial encounter: Secondary | ICD-10-CM | POA: Diagnosis present

## 2024-09-03 DIAGNOSIS — R000 Tachycardia, unspecified: Secondary | ICD-10-CM | POA: Diagnosis not present

## 2024-09-03 DIAGNOSIS — S065XAA Traumatic subdural hemorrhage with loss of consciousness status unknown, initial encounter: Secondary | ICD-10-CM | POA: Diagnosis not present

## 2024-09-03 DIAGNOSIS — Z8249 Family history of ischemic heart disease and other diseases of the circulatory system: Secondary | ICD-10-CM

## 2024-09-03 DIAGNOSIS — S8992XA Unspecified injury of left lower leg, initial encounter: Secondary | ICD-10-CM | POA: Diagnosis not present

## 2024-09-03 DIAGNOSIS — S83232A Complex tear of medial meniscus, current injury, left knee, initial encounter: Secondary | ICD-10-CM | POA: Diagnosis present

## 2024-09-03 DIAGNOSIS — Y738 Miscellaneous gastroenterology and urology devices associated with adverse incidents, not elsewhere classified: Secondary | ICD-10-CM | POA: Diagnosis not present

## 2024-09-03 DIAGNOSIS — R52 Pain, unspecified: Secondary | ICD-10-CM | POA: Diagnosis not present

## 2024-09-03 DIAGNOSIS — S60512A Abrasion of left hand, initial encounter: Secondary | ICD-10-CM | POA: Diagnosis present

## 2024-09-03 DIAGNOSIS — M810 Age-related osteoporosis without current pathological fracture: Secondary | ICD-10-CM | POA: Diagnosis present

## 2024-09-03 DIAGNOSIS — R339 Retention of urine, unspecified: Secondary | ICD-10-CM | POA: Diagnosis not present

## 2024-09-03 DIAGNOSIS — I472 Ventricular tachycardia, unspecified: Secondary | ICD-10-CM | POA: Diagnosis not present

## 2024-09-03 DIAGNOSIS — Z880 Allergy status to penicillin: Secondary | ICD-10-CM

## 2024-09-03 DIAGNOSIS — R9082 White matter disease, unspecified: Secondary | ICD-10-CM | POA: Diagnosis not present

## 2024-09-03 DIAGNOSIS — G4700 Insomnia, unspecified: Secondary | ICD-10-CM | POA: Diagnosis present

## 2024-09-03 DIAGNOSIS — Z882 Allergy status to sulfonamides status: Secondary | ICD-10-CM

## 2024-09-03 DIAGNOSIS — F419 Anxiety disorder, unspecified: Secondary | ICD-10-CM | POA: Diagnosis present

## 2024-09-03 DIAGNOSIS — I358 Other nonrheumatic aortic valve disorders: Secondary | ICD-10-CM | POA: Diagnosis present

## 2024-09-03 DIAGNOSIS — I959 Hypotension, unspecified: Secondary | ICD-10-CM | POA: Diagnosis not present

## 2024-09-03 DIAGNOSIS — I452 Bifascicular block: Secondary | ICD-10-CM | POA: Diagnosis not present

## 2024-09-03 DIAGNOSIS — S92511D Displaced fracture of proximal phalanx of right lesser toe(s), subsequent encounter for fracture with routine healing: Secondary | ICD-10-CM | POA: Diagnosis not present

## 2024-09-03 DIAGNOSIS — Y92410 Unspecified street and highway as the place of occurrence of the external cause: Secondary | ICD-10-CM | POA: Diagnosis not present

## 2024-09-03 DIAGNOSIS — I700 Atherosclerosis of aorta: Secondary | ICD-10-CM | POA: Diagnosis not present

## 2024-09-03 DIAGNOSIS — S0101XA Laceration without foreign body of scalp, initial encounter: Secondary | ICD-10-CM | POA: Diagnosis present

## 2024-09-03 DIAGNOSIS — S92511A Displaced fracture of proximal phalanx of right lesser toe(s), initial encounter for closed fracture: Secondary | ICD-10-CM | POA: Diagnosis not present

## 2024-09-03 DIAGNOSIS — Z853 Personal history of malignant neoplasm of breast: Secondary | ICD-10-CM

## 2024-09-03 DIAGNOSIS — M47812 Spondylosis without myelopathy or radiculopathy, cervical region: Secondary | ICD-10-CM | POA: Diagnosis not present

## 2024-09-03 DIAGNOSIS — K5901 Slow transit constipation: Secondary | ICD-10-CM | POA: Diagnosis not present

## 2024-09-03 DIAGNOSIS — I951 Orthostatic hypotension: Secondary | ICD-10-CM | POA: Diagnosis not present

## 2024-09-03 DIAGNOSIS — S60511A Abrasion of right hand, initial encounter: Secondary | ICD-10-CM | POA: Diagnosis present

## 2024-09-03 DIAGNOSIS — M5430 Sciatica, unspecified side: Secondary | ICD-10-CM | POA: Diagnosis present

## 2024-09-03 DIAGNOSIS — S92516A Nondisplaced fracture of proximal phalanx of unspecified lesser toe(s), initial encounter for closed fracture: Secondary | ICD-10-CM | POA: Diagnosis not present

## 2024-09-03 DIAGNOSIS — S066XAA Traumatic subarachnoid hemorrhage with loss of consciousness status unknown, initial encounter: Secondary | ICD-10-CM | POA: Diagnosis not present

## 2024-09-03 DIAGNOSIS — R40241 Glasgow coma scale score 13-15, unspecified time: Secondary | ICD-10-CM | POA: Diagnosis present

## 2024-09-03 DIAGNOSIS — Z87440 Personal history of urinary (tract) infections: Secondary | ICD-10-CM

## 2024-09-03 DIAGNOSIS — M1990 Unspecified osteoarthritis, unspecified site: Secondary | ICD-10-CM | POA: Diagnosis present

## 2024-09-03 DIAGNOSIS — Z9889 Other specified postprocedural states: Secondary | ICD-10-CM

## 2024-09-03 DIAGNOSIS — N3000 Acute cystitis without hematuria: Secondary | ICD-10-CM | POA: Diagnosis not present

## 2024-09-03 DIAGNOSIS — F411 Generalized anxiety disorder: Secondary | ICD-10-CM | POA: Diagnosis not present

## 2024-09-03 DIAGNOSIS — S8262XA Displaced fracture of lateral malleolus of left fibula, initial encounter for closed fracture: Secondary | ICD-10-CM | POA: Diagnosis present

## 2024-09-03 DIAGNOSIS — K5900 Constipation, unspecified: Secondary | ICD-10-CM | POA: Diagnosis not present

## 2024-09-03 DIAGNOSIS — S199XXA Unspecified injury of neck, initial encounter: Secondary | ICD-10-CM | POA: Diagnosis not present

## 2024-09-03 DIAGNOSIS — I479 Paroxysmal tachycardia, unspecified: Secondary | ICD-10-CM | POA: Diagnosis not present

## 2024-09-03 DIAGNOSIS — S4292XA Fracture of left shoulder girdle, part unspecified, initial encounter for closed fracture: Secondary | ICD-10-CM | POA: Diagnosis not present

## 2024-09-03 LAB — I-STAT CHEM 8, ED
BUN: 28 mg/dL — ABNORMAL HIGH (ref 8–23)
Calcium, Ion: 1.09 mmol/L — ABNORMAL LOW (ref 1.15–1.40)
Chloride: 106 mmol/L (ref 98–111)
Creatinine, Ser: 0.8 mg/dL (ref 0.44–1.00)
Glucose, Bld: 156 mg/dL — ABNORMAL HIGH (ref 70–99)
HCT: 44 % (ref 36.0–46.0)
Hemoglobin: 15 g/dL (ref 12.0–15.0)
Potassium: 4.2 mmol/L (ref 3.5–5.1)
Sodium: 136 mmol/L (ref 135–145)
TCO2: 22 mmol/L (ref 22–32)

## 2024-09-03 LAB — PROTIME-INR
INR: 0.9 (ref 0.8–1.2)
Prothrombin Time: 13.1 s (ref 11.4–15.2)

## 2024-09-03 LAB — CBC
HCT: 45.9 % (ref 36.0–46.0)
Hemoglobin: 15.1 g/dL — ABNORMAL HIGH (ref 12.0–15.0)
MCH: 30.8 pg (ref 26.0–34.0)
MCHC: 32.9 g/dL (ref 30.0–36.0)
MCV: 93.5 fL (ref 80.0–100.0)
Platelets: 219 K/uL (ref 150–400)
RBC: 4.91 MIL/uL (ref 3.87–5.11)
RDW: 12.8 % (ref 11.5–15.5)
WBC: 13.3 K/uL — ABNORMAL HIGH (ref 4.0–10.5)
nRBC: 0 % (ref 0.0–0.2)

## 2024-09-03 LAB — I-STAT CG4 LACTIC ACID, ED: Lactic Acid, Venous: 1.6 mmol/L (ref 0.5–1.9)

## 2024-09-03 LAB — COMPREHENSIVE METABOLIC PANEL WITH GFR
ALT: 83 U/L — ABNORMAL HIGH (ref 0–44)
AST: 106 U/L — ABNORMAL HIGH (ref 15–41)
Albumin: 3.8 g/dL (ref 3.5–5.0)
Alkaline Phosphatase: 70 U/L (ref 38–126)
Anion gap: 12 (ref 5–15)
BUN: 20 mg/dL (ref 8–23)
CO2: 21 mmol/L — ABNORMAL LOW (ref 22–32)
Calcium: 9.5 mg/dL (ref 8.9–10.3)
Chloride: 102 mmol/L (ref 98–111)
Creatinine, Ser: 0.79 mg/dL (ref 0.44–1.00)
GFR, Estimated: >60 mL/min (ref >60)
Glucose, Bld: 155 mg/dL — ABNORMAL HIGH (ref 70–99)
Potassium: 3.9 mmol/L (ref 3.5–5.1)
Sodium: 135 mmol/L (ref 135–145)
Total Bilirubin: 1 mg/dL (ref 0.0–1.2)
Total Protein: 6.8 g/dL (ref 6.5–8.1)

## 2024-09-03 LAB — ETHANOL: Alcohol, Ethyl (B): <15 mg/dL (ref <15)

## 2024-09-03 LAB — SAMPLE TO BLOOD BANK

## 2024-09-03 MED ORDER — IOHEXOL 350 MG/ML SOLN
75.0000 mL | Freq: Once | INTRAVENOUS | Status: AC | PRN
  Administered 2024-09-03: 75 mL via INTRAVENOUS

## 2024-09-03 MED ORDER — LIDOCAINE 5 % EX PTCH
2.0000 | MEDICATED_PATCH | CUTANEOUS | Status: DC
  Administered 2024-09-03: 1 via TRANSDERMAL
  Administered 2024-09-04 – 2024-09-11 (×8): 2 via TRANSDERMAL
  Filled 2024-09-03 (×9): qty 2

## 2024-09-03 MED ORDER — METOPROLOL TARTRATE 5 MG/5ML IV SOLN
5.0000 mg | Freq: Three times a day (TID) | INTRAVENOUS | Status: DC | PRN
  Administered 2024-09-03 – 2024-09-08 (×5): 5 mg via INTRAVENOUS
  Filled 2024-09-03 (×5): qty 5

## 2024-09-03 MED ORDER — LEVETIRACETAM (KEPPRA) 500 MG/5 ML ADULT IV PUSH
500.0000 mg | Freq: Two times a day (BID) | INTRAVENOUS | Status: AC
  Administered 2024-09-03 – 2024-09-10 (×14): 500 mg via INTRAVENOUS
  Filled 2024-09-03 (×14): qty 5

## 2024-09-03 MED ORDER — ACETAMINOPHEN 500 MG PO TABS
1000.0000 mg | ORAL_TABLET | Freq: Four times a day (QID) | ORAL | Status: DC
  Administered 2024-09-03 – 2024-09-07 (×10): 1000 mg via ORAL
  Filled 2024-09-03 (×12): qty 2

## 2024-09-03 MED ORDER — FENTANYL CITRATE PF 50 MCG/ML IJ SOSY
12.5000 ug | PREFILLED_SYRINGE | INTRAMUSCULAR | Status: DC | PRN
  Administered 2024-09-03 – 2024-09-04 (×2): 12.5 ug via INTRAVENOUS
  Filled 2024-09-03 (×2): qty 1

## 2024-09-03 MED ORDER — METOPROLOL SUCCINATE ER 25 MG PO TB24: 50.0000 mg | ORAL_TABLET | Freq: Two times a day (BID) | ORAL | Status: DC

## 2024-09-03 MED ORDER — SODIUM CHLORIDE 0.9 % IV SOLN: INTRAVENOUS | Status: AC

## 2024-09-03 MED ORDER — FENTANYL CITRATE PF 50 MCG/ML IJ SOSY
25.0000 ug | PREFILLED_SYRINGE | Freq: Once | INTRAMUSCULAR | Status: DC
  Filled 2024-09-03: qty 1

## 2024-09-03 MED ORDER — TRAMADOL HCL 50 MG PO TABS
25.0000 mg | ORAL_TABLET | Freq: Four times a day (QID) | ORAL | Status: DC | PRN
  Administered 2024-09-04: 25 mg via ORAL
  Filled 2024-09-03: qty 1

## 2024-09-03 MED ORDER — DOXAZOSIN MESYLATE 2 MG PO TABS
2.0000 mg | ORAL_TABLET | Freq: Every day | ORAL | Status: DC
Start: 2024-09-04 — End: 2024-09-05
  Administered 2024-09-04: 2 mg via ORAL
  Filled 2024-09-03 (×3): qty 1

## 2024-09-03 MED ORDER — ONDANSETRON HCL 4 MG/2ML IJ SOLN
4.0000 mg | Freq: Once | INTRAMUSCULAR | Status: AC
  Administered 2024-09-03: 4 mg via INTRAVENOUS
  Filled 2024-09-03: qty 2

## 2024-09-03 MED ORDER — NORTRIPTYLINE HCL 10 MG PO CAPS
30.0000 mg | ORAL_CAPSULE | Freq: Every day | ORAL | Status: DC
  Administered 2024-09-03 – 2024-09-10 (×8): 30 mg via ORAL
  Filled 2024-09-03 (×9): qty 3

## 2024-09-03 MED ORDER — FENTANYL CITRATE PF 50 MCG/ML IJ SOSY
25.0000 ug | PREFILLED_SYRINGE | Freq: Once | INTRAMUSCULAR | Status: AC
  Administered 2024-09-03: 25 ug via INTRAVENOUS
  Filled 2024-09-03: qty 1

## 2024-09-03 MED ORDER — METOPROLOL TARTRATE 50 MG PO TABS
100.0000 mg | ORAL_TABLET | Freq: Two times a day (BID) | ORAL | Status: DC
  Administered 2024-09-04: 100 mg via ORAL
  Filled 2024-09-03: qty 2
  Filled 2024-09-03: qty 4

## 2024-09-03 MED ORDER — POLYETHYLENE GLYCOL 3350 17 G PO PACK: 17.0000 g | PACK | Freq: Every day | ORAL | Status: DC | PRN

## 2024-09-03 MED ORDER — METHOCARBAMOL 500 MG PO TABS
500.0000 mg | ORAL_TABLET | Freq: Three times a day (TID) | ORAL | Status: AC | PRN
  Administered 2024-09-04: 500 mg via ORAL
  Filled 2024-09-03: qty 1

## 2024-09-03 MED ORDER — LIDOCAINE-EPINEPHRINE (PF) 2 %-1:200000 IJ SOLN
10.0000 mL | Freq: Once | INTRAMUSCULAR | Status: AC
  Administered 2024-09-03: 10 mL via INTRADERMAL
  Filled 2024-09-03: qty 20

## 2024-09-03 MED ORDER — ONDANSETRON 4 MG PO TBDP
4.0000 mg | ORAL_TABLET | Freq: Four times a day (QID) | ORAL | Status: DC | PRN
  Administered 2024-09-09 (×2): 4 mg via ORAL
  Filled 2024-09-03 (×2): qty 1

## 2024-09-03 MED ORDER — LACTATED RINGERS IV BOLUS
1000.0000 mL | Freq: Once | INTRAVENOUS | Status: AC
  Administered 2024-09-03: 1000 mL via INTRAVENOUS

## 2024-09-03 MED ORDER — ONDANSETRON HCL 4 MG/2ML IJ SOLN: 4.0000 mg | Freq: Four times a day (QID) | INTRAMUSCULAR | Status: DC | PRN

## 2024-09-03 MED ORDER — DOCUSATE SODIUM 100 MG PO CAPS
100.0000 mg | ORAL_CAPSULE | Freq: Two times a day (BID) | ORAL | Status: DC
  Administered 2024-09-03 – 2024-09-11 (×15): 100 mg via ORAL
  Filled 2024-09-03 (×17): qty 1

## 2024-09-03 MED ORDER — IRBESARTAN 150 MG PO TABS: 150.0000 mg | ORAL_TABLET | Freq: Every day | ORAL | Status: DC

## 2024-09-03 MED ORDER — PRAVASTATIN SODIUM 40 MG PO TABS
40.0000 mg | ORAL_TABLET | Freq: Every day | ORAL | Status: DC
Start: 2024-09-04 — End: 2024-09-11
  Administered 2024-09-04 – 2024-09-10 (×7): 40 mg via ORAL
  Filled 2024-09-03 (×7): qty 1

## 2024-09-03 MED ORDER — DOXAZOSIN MESYLATE 4 MG PO TABS
4.0000 mg | ORAL_TABLET | Freq: Every day | ORAL | Status: DC
Start: 2024-09-03 — End: 2024-09-03
  Filled 2024-09-03 (×2): qty 1

## 2024-09-03 MED ORDER — PANTOPRAZOLE SODIUM 40 MG PO TBEC: 40.0000 mg | DELAYED_RELEASE_TABLET | Freq: Every day | ORAL | Status: DC

## 2024-09-03 MED ORDER — PRAVASTATIN SODIUM 40 MG PO TABS: 40.0000 mg | ORAL_TABLET | Freq: Every day | ORAL | Status: DC

## 2024-09-03 MED ORDER — HYDRALAZINE HCL 20 MG/ML IJ SOLN: 10.0000 mg | Freq: Four times a day (QID) | INTRAMUSCULAR | Status: DC | PRN

## 2024-09-03 MED ORDER — PANTOPRAZOLE SODIUM 40 MG PO TBEC
40.0000 mg | DELAYED_RELEASE_TABLET | Freq: Every day | ORAL | Status: DC
  Administered 2024-09-04 – 2024-09-11 (×8): 40 mg via ORAL
  Filled 2024-09-03 (×8): qty 1

## 2024-09-03 MED ORDER — ONDANSETRON HCL 4 MG/2ML IJ SOLN: 4.0000 mg | Freq: Once | INTRAMUSCULAR | Status: DC

## 2024-09-03 MED ORDER — METHOCARBAMOL 1000 MG/10ML IJ SOLN
500.0000 mg | Freq: Three times a day (TID) | INTRAMUSCULAR | Status: AC | PRN
  Administered 2024-09-04: 500 mg via INTRAVENOUS
  Filled 2024-09-03: qty 10

## 2024-09-03 MED ORDER — LIDOCAINE HCL (PF) 1 % IJ SOLN
5.0000 mL | Freq: Once | INTRAMUSCULAR | Status: AC
  Administered 2024-09-03: 5 mL
  Filled 2024-09-03: qty 5

## 2024-09-03 MED ORDER — SODIUM CHLORIDE 0.9 % IV SOLN
12.5000 mg | Freq: Three times a day (TID) | INTRAVENOUS | Status: DC | PRN
  Filled 2024-09-03: qty 0.5
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
## 2024-09-03 NOTE — Progress Notes (Signed)
 Orthopedic Tech Progress Note Patient Details:  Marie Alvarez [birth year] [id number]  Ortho Devices Type of Ortho Device: CAM walker Ortho Device/Splint Location: RLE Ortho Device/Splint Interventions: Ordered, Application, Adjustment   Post Interventions Patient Tolerated: Fair, Well Instructions Provided: Care of device  Marie Alvarez Pac 09/03/2024, 4:25 PM
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
## 2024-09-03 NOTE — ED Notes (Signed)
 Provider ok to try PO meds as speech's note says can swallow as tolerated.

## 2024-09-03 NOTE — ED Notes (Addendum)
 2cm to left forehead, nasal tenderness, 1cm nasal evulsion, no loose teeth. Bilateral shoulder abrasions and bilateral lower extremities abrasions.
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
## 2024-09-03 NOTE — ED Provider Notes (Signed)
..  Lac repair Mckennah Kretchmer  Date/Time: 09/03/2024 2:59 PM  Performed by: Hildegard Loge, PA-C Authorized by: Hildegard Loge, PA-C   Consent:    Consent obtained:  Verbal   Consent given by:  Patient   Risks discussed:  Need for additional repair, infection, retained foreign body, poor cosmetic result and poor wound healing   Alternatives discussed:  No treatment Universal protocol:    Procedure explained and questions answered to patient or proxy's satisfaction: yes     Relevant documents present and verified: yes     Patient identity confirmed:  Verbally with patient and arm band Laceration details:    Location:  Face   Face location:  Nose   Length (cm):  3 Pre-procedure details:    Preparation:  Patient was prepped and draped in usual sterile fashion Treatment:    Area cleansed with:  Saline and povidone-iodine   Amount of cleaning:  Standard   Irrigation solution:  Sterile saline   Irrigation volume:  500   Irrigation method:  Tap   Debridement:  None   Undermining:  None Skin repair:    Repair method:  Sutures   Suture size:  4-0   Suture material:  Prolene   Suture technique:  Simple interrupted   Number of sutures:  2 Approximation:    Approximation:  Close Repair type:    Repair type:  Simple Post-procedure details:    Dressing:  Open (no dressing)   Procedure completion:  Tolerated well, no immediate complications     Hildegard Loge, PA-C 09/03/24 1500    Dreama Longs, MD 09/03/24 2205
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
## 2024-09-03 NOTE — ED Notes (Signed)
 CCMD Called

## 2024-09-03 NOTE — TOC CAGE-AID Note (Signed)
 Transition of Care [facility]) - CAGE-AID Screening  Patient Details  Name: Marie Alvarez MRN: [id number] Date of Birth: [birth year]  Clinical Narrative:  Patient denies any current alcohol or drug use, substance abuse resources not provided at this time.  CAGE-AID Screening:   Have You Ever Felt You Ought to Cut Down on Your Drinking or Drug Use?: No Have People Annoyed You By Critizing Your Drinking Or Drug Use?: No Have You Felt Bad Or Guilty About Your Drinking Or Drug Use?: No Have You Ever Had a Drink or Used Drugs First Thing In The Morning to Steady Your Nerves or to Get Rid of a Hangover?: No CAGE-AID Score: 0
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
## 2024-09-03 NOTE — ED Notes (Signed)
 Patient reports inability to urinate today. MD made aware via secure chat of bladder scan result.
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
## 2024-09-04 ENCOUNTER — Inpatient Hospital Stay (HOSPITAL_COMMUNITY)

## 2024-09-04 DIAGNOSIS — S82832A Other fracture of upper and lower end of left fibula, initial encounter for closed fracture: Secondary | ICD-10-CM | POA: Diagnosis not present

## 2024-09-04 DIAGNOSIS — S065XAA Traumatic subdural hemorrhage with loss of consciousness status unknown, initial encounter: Secondary | ICD-10-CM | POA: Diagnosis not present

## 2024-09-04 DIAGNOSIS — I10 Essential (primary) hypertension: Secondary | ICD-10-CM

## 2024-09-04 DIAGNOSIS — S0181XA Laceration without foreign body of other part of head, initial encounter: Secondary | ICD-10-CM | POA: Diagnosis not present

## 2024-09-04 DIAGNOSIS — R7401 Elevation of levels of liver transaminase levels: Secondary | ICD-10-CM

## 2024-09-04 LAB — CBC WITH DIFFERENTIAL/PLATELET
Abs Immature Granulocytes: 0.04 K/uL (ref 0.00–0.07)
Basophils Absolute: 0 K/uL (ref 0.0–0.1)
Basophils Relative: 0 %
Eosinophils Absolute: 0.1 K/uL (ref 0.0–0.5)
Eosinophils Relative: 1 %
HCT: 31 % — ABNORMAL LOW (ref 36.0–46.0)
Hemoglobin: 10.1 g/dL — ABNORMAL LOW (ref 12.0–15.0)
Immature Granulocytes: 0 %
Lymphocytes Relative: 13 %
Lymphs Abs: 1.2 K/uL (ref 0.7–4.0)
MCH: 31.5 pg (ref 26.0–34.0)
MCHC: 32.6 g/dL (ref 30.0–36.0)
MCV: 96.6 fL (ref 80.0–100.0)
Monocytes Absolute: 0.9 K/uL (ref 0.1–1.0)
Monocytes Relative: 10 %
Neutro Abs: 6.8 K/uL (ref 1.7–7.7)
Neutrophils Relative %: 76 %
Platelets: 127 K/uL — ABNORMAL LOW (ref 150–400)
RBC: 3.21 MIL/uL — ABNORMAL LOW (ref 3.87–5.11)
RDW: 12.8 % (ref 11.5–15.5)
WBC: 9 K/uL (ref 4.0–10.5)
nRBC: 0 % (ref 0.0–0.2)

## 2024-09-04 LAB — URINALYSIS, ROUTINE W REFLEX MICROSCOPIC
Bacteria, UA: NONE SEEN
Bilirubin Urine: NEGATIVE
Glucose, UA: NEGATIVE mg/dL
Hgb urine dipstick: NEGATIVE
Ketones, ur: 5 mg/dL — AB
Leukocytes,Ua: NEGATIVE
Nitrite: NEGATIVE
Protein, ur: NEGATIVE mg/dL
Specific Gravity, Urine: 1.038 — ABNORMAL HIGH (ref 1.005–1.030)
pH: 5 (ref 5.0–8.0)

## 2024-09-04 LAB — COMPREHENSIVE METABOLIC PANEL WITH GFR
ALT: 58 U/L — ABNORMAL HIGH (ref 0–44)
AST: 58 U/L — ABNORMAL HIGH (ref 15–41)
Albumin: 2.7 g/dL — ABNORMAL LOW (ref 3.5–5.0)
Alkaline Phosphatase: 39 U/L (ref 38–126)
Anion gap: 9 (ref 5–15)
BUN: 15 mg/dL (ref 8–23)
CO2: 19 mmol/L — ABNORMAL LOW (ref 22–32)
Calcium: 8.3 mg/dL — ABNORMAL LOW (ref 8.9–10.3)
Chloride: 107 mmol/L (ref 98–111)
Creatinine, Ser: 0.63 mg/dL (ref 0.44–1.00)
GFR, Estimated: >60 mL/min (ref >60)
Glucose, Bld: 109 mg/dL — ABNORMAL HIGH (ref 70–99)
Potassium: 3.5 mmol/L (ref 3.5–5.1)
Sodium: 135 mmol/L (ref 135–145)
Total Bilirubin: 1.1 mg/dL (ref 0.0–1.2)
Total Protein: 4.9 g/dL — ABNORMAL LOW (ref 6.5–8.1)

## 2024-09-04 LAB — PROTIME-INR
INR: 1 (ref 0.8–1.2)
Prothrombin Time: 13.8 s (ref 11.4–15.2)

## 2024-09-04 LAB — MAGNESIUM: Magnesium: 1.7 mg/dL (ref 1.7–2.4)

## 2024-09-04 LAB — PHOSPHORUS: Phosphorus: 2.9 mg/dL (ref 2.5–4.6)

## 2024-09-04 MED ORDER — HYDROMORPHONE HCL 1 MG/ML IJ SOLN
0.5000 mg | INTRAMUSCULAR | Status: DC | PRN
Start: 2024-09-04 — End: 2024-09-11

## 2024-09-04 MED ORDER — BETHANECHOL CHLORIDE 10 MG PO TABS
25.0000 mg | ORAL_TABLET | Freq: Three times a day (TID) | ORAL | Status: DC
  Administered 2024-09-04 – 2024-09-11 (×23): 25 mg via ORAL
  Filled 2024-09-04 (×7): qty 3
  Filled 2024-09-04: qty 1
  Filled 2024-09-04 (×5): qty 3
  Filled 2024-09-04: qty 1
  Filled 2024-09-04 (×5): qty 3
  Filled 2024-09-04: qty 1
  Filled 2024-09-04 (×5): qty 3

## 2024-09-04 MED ORDER — HYDRALAZINE HCL 20 MG/ML IJ SOLN
10.0000 mg | Freq: Four times a day (QID) | INTRAMUSCULAR | Status: DC | PRN
  Administered 2024-09-09: 10 mg via INTRAVENOUS
  Filled 2024-09-04 (×2): qty 1

## 2024-09-04 MED ORDER — FERROUS SULFATE 325 (65 FE) MG PO TABS
325.0000 mg | ORAL_TABLET | Freq: Every day | ORAL | Status: DC
  Administered 2024-09-05 – 2024-09-11 (×7): 325 mg via ORAL
  Filled 2024-09-04 (×7): qty 1

## 2024-09-04 MED ORDER — OXYCODONE HCL 5 MG PO TABS
2.5000 mg | ORAL_TABLET | ORAL | Status: DC | PRN
  Administered 2024-09-04 – 2024-09-06 (×4): 5 mg via ORAL
  Administered 2024-09-07: 2.5 mg via ORAL
  Filled 2024-09-04 (×5): qty 1

## 2024-09-04 MED ORDER — LORATADINE 10 MG PO TABS
10.0000 mg | ORAL_TABLET | Freq: Every day | ORAL | Status: DC
Start: 2024-09-04 — End: 2024-09-11
  Administered 2024-09-04 – 2024-09-11 (×8): 10 mg via ORAL
  Filled 2024-09-04 (×8): qty 1

## 2024-09-04 MED ORDER — VITAMIN C 500 MG PO TABS
500.0000 mg | ORAL_TABLET | Freq: Every day | ORAL | Status: DC
  Administered 2024-09-04 – 2024-09-11 (×8): 500 mg via ORAL
  Filled 2024-09-04 (×8): qty 1
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
## 2024-09-04 NOTE — ED Notes (Signed)
Blue top recollected and sent to lab 

## 2024-09-04 NOTE — Progress Notes (Signed)
   Inpatient Rehab Admissions Coordinator :  Per therapy recommendations patient was screened for CIR candidacy by Heron Leavell RN MSN. Bed level assessment yet. Patient is not yet at a level to tolerate the intensity required to pursue a CIR admit. The CIR admissions team will follow and monitor for progress and place a Rehab Consult order if felt to be appropriate. Please contact me with any questions.  Heron Leavell RN MSN Admissions Coordinator [phone]

## 2024-09-04 NOTE — Progress Notes (Signed)
 Patient ID: Marie Alvarez, female   DOB: 1931/12/08, 89 y.o.   MRN: [id number]  Reviewed CT left knee which shows posterolateral corner injury. Will get MRI to further elucidate. Hinged knee brace, WBAT for now.    Marie DOROTHA Ned, PA-C Orthopedic Surgery [phone]

## 2024-09-04 NOTE — Progress Notes (Signed)
 Pt admitted to rm 20 from ED. Initiated tele. Obtained vs. Oriented pt to the unit. Call bell within reach.   Amado GORMAN Arabia, RN
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
## 2024-09-04 NOTE — Progress Notes (Signed)
 PT Cancellation Note  Patient Details Name: NYIESHA BEEVER MRN: [id number] DOB: 06-27-1931   Cancelled Treatment:    Reason Eval/Treat Not Completed: Medical issues which prohibited therapy (Pt with left fibula fx per CT.  Asked PA regarding weight bearing and PA asked PT to HOLD for now until he gives more clarification. Will check back as able.)   Stephane JULIANNA Bevel 09/04/2024, 9:16 AM Francis Doenges M,PT Acute Rehab Services [phone]
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
## 2024-09-04 NOTE — ED Notes (Signed)
 Ortho at [facility] placing brace

## 2024-09-04 NOTE — Progress Notes (Signed)
 Orthopedic Tech Progress Note Patient Details:  Marie Alvarez 1931/02/19 [id number]  Calle din order to [facility] for a ROM KNEE BRACE LOCKED  Patient ID: Marie Alvarez, female   DOB: 12-25-1930, 89 y.o.   MRN: [id number]  [name] Pac 09/04/2024, 9:40 AM

## 2024-09-05 ENCOUNTER — Inpatient Hospital Stay (HOSPITAL_COMMUNITY)

## 2024-09-05 DIAGNOSIS — S065XAA Traumatic subdural hemorrhage with loss of consciousness status unknown, initial encounter: Secondary | ICD-10-CM | POA: Diagnosis not present

## 2024-09-05 DIAGNOSIS — S0181XA Laceration without foreign body of other part of head, initial encounter: Secondary | ICD-10-CM | POA: Diagnosis not present

## 2024-09-05 DIAGNOSIS — S82832A Other fracture of upper and lower end of left fibula, initial encounter for closed fracture: Secondary | ICD-10-CM | POA: Diagnosis not present

## 2024-09-05 LAB — COMPREHENSIVE METABOLIC PANEL WITH GFR
ALT: 49 U/L — ABNORMAL HIGH (ref 0–44)
AST: 37 U/L (ref 15–41)
Albumin: 2.7 g/dL — ABNORMAL LOW (ref 3.5–5.0)
Alkaline Phosphatase: 41 U/L (ref 38–126)
Anion gap: 6 (ref 5–15)
BUN: 19 mg/dL (ref 8–23)
CO2: 22 mmol/L (ref 22–32)
Calcium: 8.8 mg/dL — ABNORMAL LOW (ref 8.9–10.3)
Chloride: 103 mmol/L (ref 98–111)
Creatinine, Ser: 0.82 mg/dL (ref 0.44–1.00)
GFR, Estimated: >60 mL/min (ref >60)
Glucose, Bld: 128 mg/dL — ABNORMAL HIGH (ref 70–99)
Potassium: 4 mmol/L (ref 3.5–5.1)
Sodium: 131 mmol/L — ABNORMAL LOW (ref 135–145)
Total Bilirubin: 0.7 mg/dL (ref 0.0–1.2)
Total Protein: 5.2 g/dL — ABNORMAL LOW (ref 6.5–8.1)

## 2024-09-05 LAB — CBC
HCT: 27.9 % — ABNORMAL LOW (ref 36.0–46.0)
Hemoglobin: 9.4 g/dL — ABNORMAL LOW (ref 12.0–15.0)
MCH: 31.3 pg (ref 26.0–34.0)
MCHC: 33.7 g/dL (ref 30.0–36.0)
MCV: 93 fL (ref 80.0–100.0)
Platelets: 118 K/uL — ABNORMAL LOW (ref 150–400)
RBC: 3 MIL/uL — ABNORMAL LOW (ref 3.87–5.11)
RDW: 13 % (ref 11.5–15.5)
WBC: 7.8 K/uL (ref 4.0–10.5)
nRBC: 0 % (ref 0.0–0.2)

## 2024-09-05 MED ORDER — METOPROLOL TARTRATE 50 MG PO TABS
50.0000 mg | ORAL_TABLET | Freq: Two times a day (BID) | ORAL | Status: DC
  Administered 2024-09-05 – 2024-09-11 (×12): 50 mg via ORAL
  Filled 2024-09-05 (×13): qty 1

## 2024-09-05 MED ORDER — SODIUM CHLORIDE 0.9 % IV SOLN: INTRAVENOUS | Status: AC

## 2024-09-05 MED ORDER — CHLORHEXIDINE GLUCONATE CLOTH 2 % EX PADS
6.0000 | MEDICATED_PAD | Freq: Every day | CUTANEOUS | Status: DC
  Administered 2024-09-05 – 2024-09-11 (×7): 6 via TOPICAL

## 2024-09-05 NOTE — Progress Notes (Signed)
 Called regarding progression of hemorrhage on CT head. I agree that there is slight expansion compared to admission, but the expansion is marginal. This would not be the cause of a neuro change.   Plan: Repeat CT head tomorrow morning to document stabilization SBP<160 If CT tomorrow is stable, still ok for DVT chemoppx tomorrow Activity and diet as tolerated Rest of cares per primary
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
## 2024-09-05 NOTE — Progress Notes (Signed)
   Inpatient Rehabilitation Admissions Coordinator   I will place rehab consult for full assessment.  Heron Leavell, RN, MSN Rehab Admissions Coordinator [phone] 09/05/2024 5:49 PM
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
## 2024-09-05 NOTE — Progress Notes (Signed)
 CTH reviewed 1. Acute subdural hemorrhage along the right aspect of the posterior falx, now measuring up to 10 mm in thickness (previously 7 mm). New from the prior CT, the subdural hematoma extends along the right tentorium. 2. Also new from the prior CT, there is small-volume acute subarachnoid hemorrhage along the right parietal and occipital lobes. 3. No significant mass effect. No hydrocephalus.  I have reached out to Dr. Darnella of [facility] for recommendations  R wrist xray reviewed 1. Possible nondisplaced fracture through the distal ulna and ulna styloid. 2. Soft tissue edema.  I have reached out to Orthopedics for recommendations.   Marie Alvarez , [facility] Surgery 09/05/2024, 4:25 PM Please see Amion for pager number during day hours 7:00am-4:30pm
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
## 2024-09-05 NOTE — Progress Notes (Signed)
 Orthopedic Tech Progress Note Patient Details:  SARAI JANUARY 1931/04/18 [id number]  Ortho Devices Type of Ortho Device: Velcro wrist splint Ortho Device/Splint Location: for RUE, delivered to 3W Ortho Device/Splint Interventions: Ordered   Post Interventions Patient Tolerated: Fair, Well Instructions Provided: Care of device  Tajh Livsey Ronal Brasil 09/05/2024, 5:52 PM
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
## 2024-09-06 ENCOUNTER — Inpatient Hospital Stay (HOSPITAL_COMMUNITY)

## 2024-09-06 DIAGNOSIS — T07XXXA Unspecified multiple injuries, initial encounter: Secondary | ICD-10-CM

## 2024-09-06 DIAGNOSIS — S06310A Contusion and laceration of right cerebrum without loss of consciousness, initial encounter: Secondary | ICD-10-CM | POA: Diagnosis not present

## 2024-09-06 DIAGNOSIS — S065XAA Traumatic subdural hemorrhage with loss of consciousness status unknown, initial encounter: Secondary | ICD-10-CM | POA: Diagnosis not present

## 2024-09-06 DIAGNOSIS — R9082 White matter disease, unspecified: Secondary | ICD-10-CM | POA: Diagnosis not present

## 2024-09-06 DIAGNOSIS — S0181XA Laceration without foreign body of other part of head, initial encounter: Secondary | ICD-10-CM | POA: Diagnosis not present

## 2024-09-06 DIAGNOSIS — S82832A Other fracture of upper and lower end of left fibula, initial encounter for closed fracture: Secondary | ICD-10-CM | POA: Diagnosis not present

## 2024-09-06 LAB — BASIC METABOLIC PANEL WITH GFR
Anion gap: 7 (ref 5–15)
BUN: 19 mg/dL (ref 8–23)
CO2: 22 mmol/L (ref 22–32)
Calcium: 8.6 mg/dL — ABNORMAL LOW (ref 8.9–10.3)
Chloride: 105 mmol/L (ref 98–111)
Creatinine, Ser: 0.83 mg/dL (ref 0.44–1.00)
GFR, Estimated: >60 mL/min (ref >60)
Glucose, Bld: 117 mg/dL — ABNORMAL HIGH (ref 70–99)
Potassium: 4 mmol/L (ref 3.5–5.1)
Sodium: 134 mmol/L — ABNORMAL LOW (ref 135–145)

## 2024-09-06 LAB — CBC
HCT: 26.8 % — ABNORMAL LOW (ref 36.0–46.0)
Hemoglobin: 8.9 g/dL — ABNORMAL LOW (ref 12.0–15.0)
MCH: 31.3 pg (ref 26.0–34.0)
MCHC: 33.2 g/dL (ref 30.0–36.0)
MCV: 94.4 fL (ref 80.0–100.0)
Platelets: 126 K/uL — ABNORMAL LOW (ref 150–400)
RBC: 2.84 MIL/uL — ABNORMAL LOW (ref 3.87–5.11)
RDW: 13 % (ref 11.5–15.5)
WBC: 8.7 K/uL (ref 4.0–10.5)
nRBC: 0 % (ref 0.0–0.2)

## 2024-09-06 MED ORDER — ENOXAPARIN SODIUM 30 MG/0.3ML IJ SOSY
30.0000 mg | PREFILLED_SYRINGE | Freq: Two times a day (BID) | INTRAMUSCULAR | Status: DC
Start: 2024-09-06 — End: 2024-09-11
  Administered 2024-09-06 – 2024-09-11 (×10): 30 mg via SUBCUTANEOUS
  Filled 2024-09-06 (×10): qty 0.3

## 2024-09-06 MED ORDER — POLYETHYLENE GLYCOL 3350 17 G PO PACK
17.0000 g | PACK | Freq: Two times a day (BID) | ORAL | Status: DC
  Administered 2024-09-06 – 2024-09-11 (×9): 17 g via ORAL
  Filled 2024-09-06 (×11): qty 1
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
## 2024-09-06 NOTE — Plan of Care (Signed)
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
## 2024-09-07 ENCOUNTER — Inpatient Hospital Stay (HOSPITAL_COMMUNITY)

## 2024-09-07 DIAGNOSIS — S92516A Nondisplaced fracture of proximal phalanx of unspecified lesser toe(s), initial encounter for closed fracture: Secondary | ICD-10-CM

## 2024-09-07 DIAGNOSIS — I959 Hypotension, unspecified: Secondary | ICD-10-CM | POA: Diagnosis not present

## 2024-09-07 DIAGNOSIS — R000 Tachycardia, unspecified: Secondary | ICD-10-CM

## 2024-09-07 DIAGNOSIS — S065XAA Traumatic subdural hemorrhage with loss of consciousness status unknown, initial encounter: Secondary | ICD-10-CM | POA: Diagnosis not present

## 2024-09-07 DIAGNOSIS — I472 Ventricular tachycardia, unspecified: Secondary | ICD-10-CM | POA: Diagnosis not present

## 2024-09-07 DIAGNOSIS — S82832A Other fracture of upper and lower end of left fibula, initial encounter for closed fracture: Secondary | ICD-10-CM | POA: Diagnosis not present

## 2024-09-07 DIAGNOSIS — I10 Essential (primary) hypertension: Secondary | ICD-10-CM | POA: Diagnosis not present

## 2024-09-07 LAB — CBC
HCT: 27.6 % — ABNORMAL LOW (ref 36.0–46.0)
Hemoglobin: 9.3 g/dL — ABNORMAL LOW (ref 12.0–15.0)
MCH: 31.4 pg (ref 26.0–34.0)
MCHC: 33.7 g/dL (ref 30.0–36.0)
MCV: 93.2 fL (ref 80.0–100.0)
Platelets: 137 K/uL — ABNORMAL LOW (ref 150–400)
RBC: 2.96 MIL/uL — ABNORMAL LOW (ref 3.87–5.11)
RDW: 13.2 % (ref 11.5–15.5)
WBC: 8.8 K/uL (ref 4.0–10.5)
nRBC: 0 % (ref 0.0–0.2)

## 2024-09-07 LAB — MAGNESIUM: Magnesium: 1.9 mg/dL (ref 1.7–2.4)

## 2024-09-07 LAB — BASIC METABOLIC PANEL WITH GFR
Anion gap: 10 (ref 5–15)
BUN: 17 mg/dL (ref 8–23)
CO2: 21 mmol/L — ABNORMAL LOW (ref 22–32)
Calcium: 8.8 mg/dL — ABNORMAL LOW (ref 8.9–10.3)
Chloride: 101 mmol/L (ref 98–111)
Creatinine, Ser: 0.66 mg/dL (ref 0.44–1.00)
GFR, Estimated: >60 mL/min (ref >60)
Glucose, Bld: 152 mg/dL — ABNORMAL HIGH (ref 70–99)
Potassium: 4.3 mmol/L (ref 3.5–5.1)
Sodium: 132 mmol/L — ABNORMAL LOW (ref 135–145)

## 2024-09-07 MED ORDER — ACETAMINOPHEN 500 MG PO TABS
1000.0000 mg | ORAL_TABLET | Freq: Three times a day (TID) | ORAL | Status: DC
  Administered 2024-09-07 – 2024-09-11 (×14): 1000 mg via ORAL
  Filled 2024-09-07 (×14): qty 2
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
## 2024-09-07 NOTE — Progress Notes (Signed)
 Echocardiogram 2D Echocardiogram has been performed.  Abi Shoults N Terilynn Buresh,RDCS 09/07/2024, 3:38 PM
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
## 2024-09-07 NOTE — Progress Notes (Signed)
 Patient unable to void foley draining greater than placed foley 14 fr  10ml per MD order.
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
## 2024-09-07 NOTE — Plan of Care (Signed)

## 2024-09-08 DIAGNOSIS — R338 Other retention of urine: Secondary | ICD-10-CM | POA: Insufficient documentation

## 2024-09-08 DIAGNOSIS — S0181XA Laceration without foreign body of other part of head, initial encounter: Secondary | ICD-10-CM | POA: Diagnosis not present

## 2024-09-08 DIAGNOSIS — S065XAA Traumatic subdural hemorrhage with loss of consciousness status unknown, initial encounter: Secondary | ICD-10-CM | POA: Diagnosis not present

## 2024-09-08 DIAGNOSIS — I959 Hypotension, unspecified: Secondary | ICD-10-CM | POA: Diagnosis not present

## 2024-09-08 DIAGNOSIS — R000 Tachycardia, unspecified: Secondary | ICD-10-CM | POA: Diagnosis not present

## 2024-09-08 DIAGNOSIS — S82832A Other fracture of upper and lower end of left fibula, initial encounter for closed fracture: Secondary | ICD-10-CM | POA: Diagnosis not present

## 2024-09-08 LAB — ECHOCARDIOGRAM COMPLETE BUBBLE STUDY
AR max vel: 2.75 cm2
AV Peak grad: 4.2 mmHg
Ao pk vel: 1.03 m/s
Calc EF: 70.6 %
S' Lateral: 2.2 cm
Single Plane A2C EF: 66 %
Single Plane A4C EF: 73.7 %

## 2024-09-08 LAB — HEPATIC FUNCTION PANEL
ALT: 33 U/L (ref 0–44)
AST: 28 U/L (ref 15–41)
Albumin: 2.6 g/dL — ABNORMAL LOW (ref 3.5–5.0)
Alkaline Phosphatase: 47 U/L (ref 38–126)
Bilirubin, Direct: 0.2 mg/dL (ref 0.0–0.2)
Indirect Bilirubin: 0.8 mg/dL (ref 0.3–0.9)
Total Bilirubin: 1 mg/dL (ref 0.0–1.2)
Total Protein: 5.3 g/dL — ABNORMAL LOW (ref 6.5–8.1)

## 2024-09-08 LAB — LIPASE, BLOOD: Lipase: 18 U/L (ref 11–51)

## 2024-09-08 MED ORDER — METHOCARBAMOL 500 MG PO TABS
500.0000 mg | ORAL_TABLET | Freq: Three times a day (TID) | ORAL | Status: DC | PRN
  Administered 2024-09-09: 500 mg via ORAL
  Filled 2024-09-08: qty 1

## 2024-09-08 MED ORDER — BISACODYL 10 MG RE SUPP
10.0000 mg | Freq: Every day | RECTAL | Status: DC
  Administered 2024-09-08: 10 mg via RECTAL
  Filled 2024-09-08: qty 1

## 2024-09-08 MED ORDER — BACITRACIN-NEOMYCIN-POLYMYXIN OINTMENT TUBE
TOPICAL_OINTMENT | CUTANEOUS | Status: DC | PRN
  Filled 2024-09-08: qty 14

## 2024-09-08 MED ORDER — AMIODARONE HCL IN DEXTROSE 360-4.14 MG/200ML-% IV SOLN
60.0000 mg/h | INTRAVENOUS | Status: DC
  Administered 2024-09-08 – 2024-09-09 (×2): 60 mg/h via INTRAVENOUS
  Filled 2024-09-08 (×2): qty 200

## 2024-09-08 MED ORDER — AMIODARONE HCL IN DEXTROSE 360-4.14 MG/200ML-% IV SOLN
30.0000 mg/h | INTRAVENOUS | Status: DC
Start: 2024-09-09 — End: 2024-09-11
  Administered 2024-09-09 – 2024-09-11 (×6): 30 mg/h via INTRAVENOUS
  Filled 2024-09-08 (×5): qty 200

## 2024-09-08 MED ORDER — AMIODARONE LOAD VIA INFUSION
150.0000 mg | Freq: Once | INTRAVENOUS | Status: AC
  Administered 2024-09-08: 150 mg via INTRAVENOUS
  Filled 2024-09-08 (×2): qty 83.34

## 2024-09-08 MED ORDER — PROMETHAZINE (PHENERGAN) 6.25MG IN NS 50ML IVPB: 6.2500 mg | Freq: Three times a day (TID) | INTRAVENOUS | Status: DC | PRN
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
## 2024-09-08 NOTE — Progress Notes (Signed)
 Since beginning of shift, pt noted to be having frequent periods of what the cardiologist on day shift titled slow rate VTach. Cards on-call, Dr. Donnel, paged at 2103 and updated. Page promptly returned and order received for Amio gtt. Medication obtained and gtt started. At time of this note (2322) pt NSR @ 85. Will continue to monitor.

## 2024-09-08 NOTE — Plan of Care (Signed)

## 2024-09-09 DIAGNOSIS — S0181XA Laceration without foreign body of other part of head, initial encounter: Secondary | ICD-10-CM | POA: Diagnosis not present

## 2024-09-09 DIAGNOSIS — R000 Tachycardia, unspecified: Secondary | ICD-10-CM | POA: Diagnosis not present

## 2024-09-09 DIAGNOSIS — I10 Essential (primary) hypertension: Secondary | ICD-10-CM | POA: Diagnosis not present

## 2024-09-09 DIAGNOSIS — S82832A Other fracture of upper and lower end of left fibula, initial encounter for closed fracture: Secondary | ICD-10-CM | POA: Diagnosis not present

## 2024-09-09 DIAGNOSIS — S065XAA Traumatic subdural hemorrhage with loss of consciousness status unknown, initial encounter: Secondary | ICD-10-CM | POA: Diagnosis not present

## 2024-09-09 LAB — BASIC METABOLIC PANEL WITH GFR
Anion gap: 10 (ref 5–15)
BUN: 15 mg/dL (ref 8–23)
CO2: 21 mmol/L — ABNORMAL LOW (ref 22–32)
Calcium: 9 mg/dL (ref 8.9–10.3)
Chloride: 101 mmol/L (ref 98–111)
Creatinine, Ser: 0.62 mg/dL (ref 0.44–1.00)
GFR, Estimated: >60 mL/min (ref >60)
Glucose, Bld: 143 mg/dL — ABNORMAL HIGH (ref 70–99)
Potassium: 4 mmol/L (ref 3.5–5.1)
Sodium: 132 mmol/L — ABNORMAL LOW (ref 135–145)

## 2024-09-09 MED ORDER — HYDRALAZINE HCL 10 MG PO TABS
10.0000 mg | ORAL_TABLET | Freq: Once | ORAL | Status: AC
Start: 2024-09-09 — End: 2024-09-09
  Administered 2024-09-09: 10 mg via ORAL
  Filled 2024-09-09: qty 1

## 2024-09-09 MED ORDER — TAMSULOSIN HCL 0.4 MG PO CAPS
0.4000 mg | ORAL_CAPSULE | Freq: Every day | ORAL | Status: DC
  Administered 2024-09-09 – 2024-09-11 (×3): 0.4 mg via ORAL
  Filled 2024-09-09 (×3): qty 1

## 2024-09-09 MED ORDER — BISACODYL 10 MG RE SUPP
10.0000 mg | Freq: Once | RECTAL | Status: AC
  Administered 2024-09-09: 10 mg via RECTAL
  Filled 2024-09-09: qty 1
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
## 2024-09-09 NOTE — Plan of Care (Signed)
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
## 2024-09-09 NOTE — Progress Notes (Signed)
 IV access lost at 0610 and Amio gtt stopped. IV Team consulted for new IV. Day RN Lauraine updated.

## 2024-09-09 NOTE — Care Management Important Message (Signed)
 Important Message  Patient Details  Name: Marie Alvarez MRN: [id number] Date of Birth: [birth year]   Important Message Given:  Yes - Medicare IM     Claretta Deed 09/09/2024, 3:27 PM
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
## 2024-09-09 NOTE — Progress Notes (Signed)
 Inpatient Rehab Admissions Coordinator:    CIR following. Pt. On amio drip, not ready for CIR at this time but will continue to follow.   Leita Kleine, MS, CCC-SLP Rehab Admissions Coordinator  [phone] (celll) [phone] (office)

## 2024-09-09 NOTE — Progress Notes (Signed)
 Dr. Franky, on-call for [facility], paged regarding pt's SBP>160 unchanged after PRN IV Hydralazine . Page promptly returned and order received for 1 time dose of PO Hydralazine . Follow-up BP 157/53. Will continue to monitor.

## 2024-09-10 DIAGNOSIS — S82832A Other fracture of upper and lower end of left fibula, initial encounter for closed fracture: Secondary | ICD-10-CM | POA: Diagnosis not present

## 2024-09-10 DIAGNOSIS — S0181XA Laceration without foreign body of other part of head, initial encounter: Secondary | ICD-10-CM | POA: Diagnosis not present

## 2024-09-10 DIAGNOSIS — S065XAA Traumatic subdural hemorrhage with loss of consciousness status unknown, initial encounter: Secondary | ICD-10-CM | POA: Diagnosis not present

## 2024-09-10 LAB — CBC
HCT: 30.1 % — ABNORMAL LOW (ref 36.0–46.0)
Hemoglobin: 9.8 g/dL — ABNORMAL LOW (ref 12.0–15.0)
MCH: 30.8 pg (ref 26.0–34.0)
MCHC: 32.6 g/dL (ref 30.0–36.0)
MCV: 94.7 fL (ref 80.0–100.0)
Platelets: 209 K/uL (ref 150–400)
RBC: 3.18 MIL/uL — ABNORMAL LOW (ref 3.87–5.11)
RDW: 13.4 % (ref 11.5–15.5)
WBC: 9.6 K/uL (ref 4.0–10.5)
nRBC: 0 % (ref 0.0–0.2)

## 2024-09-10 LAB — BASIC METABOLIC PANEL WITH GFR
Anion gap: 11 (ref 5–15)
BUN: 17 mg/dL (ref 8–23)
CO2: 22 mmol/L (ref 22–32)
Calcium: 9.1 mg/dL (ref 8.9–10.3)
Chloride: 101 mmol/L (ref 98–111)
Creatinine, Ser: 0.62 mg/dL (ref 0.44–1.00)
GFR, Estimated: >60 mL/min (ref >60)
Glucose, Bld: 122 mg/dL — ABNORMAL HIGH (ref 70–99)
Potassium: 4.3 mmol/L (ref 3.5–5.1)
Sodium: 134 mmol/L — ABNORMAL LOW (ref 135–145)

## 2024-09-10 MED ORDER — BISACODYL 10 MG RE SUPP: 10.0000 mg | Freq: Every day | RECTAL | Status: DC | PRN

## 2024-09-10 MED ORDER — INFLUENZA VAC SPLIT HIGH-DOSE 0.5 ML IM SUSY
0.5000 mL | PREFILLED_SYRINGE | INTRAMUSCULAR | Status: DC
  Filled 2024-09-10: qty 0.5

## 2024-09-10 MED ORDER — MAGNESIUM CITRATE PO SOLN
0.5000 | Freq: Once | ORAL | Status: AC
  Administered 2024-09-10: 0.5 via ORAL
  Filled 2024-09-10: qty 296

## 2024-09-10 NOTE — Plan of Care (Signed)

## 2024-09-10 NOTE — Plan of Care (Signed)
  Problem: Health Behavior/Discharge Planning: Goal: Ability to manage health-related needs will improve Outcome: Progressing   Problem: Clinical Measurements: Goal: Will remain free from infection Outcome: Progressing   Problem: Activity: Goal: Risk for activity intolerance will decrease Outcome: Progressing   Problem: Coping: Goal: Level of anxiety will decrease Outcome: Progressing   Problem: Pain Managment: Goal: General experience of comfort will improve and/or be controlled Outcome: Progressing   Problem: Safety: Goal: Ability to remain free from injury will improve Outcome: Progressing   Problem: Skin Integrity: Goal: Risk for impaired skin integrity will decrease Outcome: Progressing

## 2024-09-10 NOTE — TOC Progression Note (Signed)
 Transition of Care [facility]) - Progression Note    Patient Details  Name: Marie Alvarez MRN: [id number] Date of Birth: 1931-07-30  [facility] of [facility]) CM/SW Contact  Shynice Sigel, Mliss HERO, RN Phone Number: 09/10/2024, 3:46 PM  Clinical Narrative:    Awaiting medical stability for inpatient rehab; patient currently on amiodarone drip.  Insurance shara has been received, per Admissions Coordinator.    Expected Discharge Plan: IP Rehab Facility                                                           Social Drivers of Health (SDOH) Interventions SDOH Screenings   Food Insecurity: No Food Insecurity (09/04/2024)  Housing: Low Risk  (09/07/2024)  Transportation Needs: No Transportation Needs (09/04/2024)  Utilities: Not At Risk (09/04/2024)  Social Connections: Patient Declined (09/07/2024)  Tobacco Use: Low Risk  (09/03/2024)    Readmission Risk Interventions     No data to display         Mliss MICAEL Fass, RN, BSN  Trauma/Neuro ICU Case Manager [phone]
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
## 2024-09-10 NOTE — Progress Notes (Signed)
 Inpatient Rehab Admissions Coordinator:    CIR following. ON amio drip and not ready. I do have auth currently.   Leita Kleine, MS, CCC-SLP Rehab Admissions Coordinator  [phone] (celll) [phone] (office)
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
## 2024-09-11 ENCOUNTER — Inpatient Hospital Stay (HOSPITAL_COMMUNITY)
Admission: AD | Admit: 2024-09-11 | Discharge: 2024-09-26 | DRG: 560 | Disposition: A | Discharge status: Home / Self Care | Source: Intra-hospital | Attending: Physical Medicine and Rehabilitation | Admitting: Physical Medicine and Rehabilitation

## 2024-09-11 DIAGNOSIS — E871 Hypo-osmolality and hyponatremia: Secondary | ICD-10-CM | POA: Diagnosis not present

## 2024-09-11 DIAGNOSIS — F411 Generalized anxiety disorder: Secondary | ICD-10-CM | POA: Diagnosis not present

## 2024-09-11 DIAGNOSIS — R197 Diarrhea, unspecified: Secondary | ICD-10-CM | POA: Diagnosis not present

## 2024-09-11 DIAGNOSIS — D62 Acute posthemorrhagic anemia: Secondary | ICD-10-CM | POA: Diagnosis present

## 2024-09-11 DIAGNOSIS — I10 Essential (primary) hypertension: Secondary | ICD-10-CM | POA: Diagnosis not present

## 2024-09-11 DIAGNOSIS — K5901 Slow transit constipation: Secondary | ICD-10-CM | POA: Diagnosis not present

## 2024-09-11 DIAGNOSIS — S92511D Displaced fracture of proximal phalanx of right lesser toe(s), subsequent encounter for fracture with routine healing: Secondary | ICD-10-CM

## 2024-09-11 DIAGNOSIS — K5900 Constipation, unspecified: Secondary | ICD-10-CM | POA: Diagnosis present

## 2024-09-11 DIAGNOSIS — M25561 Pain in right knee: Secondary | ICD-10-CM | POA: Diagnosis not present

## 2024-09-11 DIAGNOSIS — S022XXD Fracture of nasal bones, subsequent encounter for fracture with routine healing: Secondary | ICD-10-CM | POA: Diagnosis not present

## 2024-09-11 DIAGNOSIS — M1711 Unilateral primary osteoarthritis, right knee: Secondary | ICD-10-CM | POA: Diagnosis not present

## 2024-09-11 DIAGNOSIS — N2889 Other specified disorders of kidney and ureter: Secondary | ICD-10-CM | POA: Diagnosis not present

## 2024-09-11 DIAGNOSIS — M25462 Effusion, left knee: Secondary | ICD-10-CM | POA: Diagnosis not present

## 2024-09-11 DIAGNOSIS — S8262XD Displaced fracture of lateral malleolus of left fibula, subsequent encounter for closed fracture with routine healing: Principal | ICD-10-CM

## 2024-09-11 DIAGNOSIS — G4700 Insomnia, unspecified: Secondary | ICD-10-CM | POA: Diagnosis not present

## 2024-09-11 DIAGNOSIS — E785 Hyperlipidemia, unspecified: Secondary | ICD-10-CM | POA: Diagnosis present

## 2024-09-11 DIAGNOSIS — B964 Proteus (mirabilis) (morganii) as the cause of diseases classified elsewhere: Secondary | ICD-10-CM | POA: Diagnosis present

## 2024-09-11 DIAGNOSIS — S92501D Displaced unspecified fracture of right lesser toe(s), subsequent encounter for fracture with routine healing: Secondary | ICD-10-CM | POA: Diagnosis not present

## 2024-09-11 DIAGNOSIS — C50211 Malignant neoplasm of upper-inner quadrant of right female breast: Secondary | ICD-10-CM | POA: Diagnosis not present

## 2024-09-11 DIAGNOSIS — K219 Gastro-esophageal reflux disease without esophagitis: Secondary | ICD-10-CM | POA: Diagnosis present

## 2024-09-11 DIAGNOSIS — I479 Paroxysmal tachycardia, unspecified: Secondary | ICD-10-CM | POA: Diagnosis not present

## 2024-09-11 DIAGNOSIS — Z9221 Personal history of antineoplastic chemotherapy: Secondary | ICD-10-CM

## 2024-09-11 DIAGNOSIS — S42302D Unspecified fracture of shaft of humerus, left arm, subsequent encounter for fracture with routine healing: Secondary | ICD-10-CM | POA: Diagnosis not present

## 2024-09-11 DIAGNOSIS — S52601D Unspecified fracture of lower end of right ulna, subsequent encounter for closed fracture with routine healing: Secondary | ICD-10-CM

## 2024-09-11 DIAGNOSIS — I959 Hypotension, unspecified: Secondary | ICD-10-CM | POA: Diagnosis not present

## 2024-09-11 DIAGNOSIS — S82832D Other fracture of upper and lower end of left fibula, subsequent encounter for closed fracture with routine healing: Secondary | ICD-10-CM

## 2024-09-11 DIAGNOSIS — H9193 Unspecified hearing loss, bilateral: Secondary | ICD-10-CM | POA: Diagnosis present

## 2024-09-11 DIAGNOSIS — T1490XA Injury, unspecified, initial encounter: Secondary | ICD-10-CM | POA: Diagnosis not present

## 2024-09-11 DIAGNOSIS — Z79899 Other long term (current) drug therapy: Secondary | ICD-10-CM

## 2024-09-11 DIAGNOSIS — M85861 Other specified disorders of bone density and structure, right lower leg: Secondary | ICD-10-CM | POA: Diagnosis not present

## 2024-09-11 DIAGNOSIS — I4719 Other supraventricular tachycardia: Secondary | ICD-10-CM | POA: Diagnosis not present

## 2024-09-11 DIAGNOSIS — R339 Retention of urine, unspecified: Secondary | ICD-10-CM

## 2024-09-11 DIAGNOSIS — Z853 Personal history of malignant neoplasm of breast: Secondary | ICD-10-CM

## 2024-09-11 DIAGNOSIS — R110 Nausea: Secondary | ICD-10-CM | POA: Diagnosis not present

## 2024-09-11 DIAGNOSIS — N390 Urinary tract infection, site not specified: Secondary | ICD-10-CM | POA: Diagnosis not present

## 2024-09-11 DIAGNOSIS — Z888 Allergy status to other drugs, medicaments and biological substances status: Secondary | ICD-10-CM

## 2024-09-11 DIAGNOSIS — Z8616 Personal history of COVID-19: Secondary | ICD-10-CM | POA: Diagnosis not present

## 2024-09-11 DIAGNOSIS — Z79890 Hormone replacement therapy (postmenopausal): Secondary | ICD-10-CM

## 2024-09-11 DIAGNOSIS — N3000 Acute cystitis without hematuria: Secondary | ICD-10-CM | POA: Diagnosis not present

## 2024-09-11 DIAGNOSIS — R52 Pain, unspecified: Secondary | ICD-10-CM | POA: Diagnosis not present

## 2024-09-11 DIAGNOSIS — Z881 Allergy status to other antibiotic agents status: Secondary | ICD-10-CM

## 2024-09-11 DIAGNOSIS — N200 Calculus of kidney: Secondary | ICD-10-CM | POA: Diagnosis not present

## 2024-09-11 DIAGNOSIS — R000 Tachycardia, unspecified: Secondary | ICD-10-CM | POA: Diagnosis not present

## 2024-09-11 DIAGNOSIS — Z923 Personal history of irradiation: Secondary | ICD-10-CM

## 2024-09-11 DIAGNOSIS — S92511A Displaced fracture of proximal phalanx of right lesser toe(s), initial encounter for closed fracture: Secondary | ICD-10-CM | POA: Diagnosis not present

## 2024-09-11 DIAGNOSIS — D259 Leiomyoma of uterus, unspecified: Secondary | ICD-10-CM | POA: Diagnosis not present

## 2024-09-11 DIAGNOSIS — Z8249 Family history of ischemic heart disease and other diseases of the circulatory system: Secondary | ICD-10-CM

## 2024-09-11 DIAGNOSIS — S42292A Other displaced fracture of upper end of left humerus, initial encounter for closed fracture: Secondary | ICD-10-CM | POA: Diagnosis not present

## 2024-09-11 DIAGNOSIS — R338 Other retention of urine: Secondary | ICD-10-CM | POA: Diagnosis not present

## 2024-09-11 DIAGNOSIS — S065XAA Traumatic subdural hemorrhage with loss of consciousness status unknown, initial encounter: Secondary | ICD-10-CM | POA: Diagnosis not present

## 2024-09-11 DIAGNOSIS — T428X5A Adverse effect of antiparkinsonism drugs and other central muscle-tone depressants, initial encounter: Secondary | ICD-10-CM | POA: Diagnosis not present

## 2024-09-11 DIAGNOSIS — S93601A Unspecified sprain of right foot, initial encounter: Secondary | ICD-10-CM | POA: Diagnosis not present

## 2024-09-11 LAB — BASIC METABOLIC PANEL WITH GFR
Anion gap: 13 (ref 5–15)
BUN: 17 mg/dL (ref 8–23)
CO2: 22 mmol/L (ref 22–32)
Calcium: 9.4 mg/dL (ref 8.9–10.3)
Chloride: 100 mmol/L (ref 98–111)
Creatinine, Ser: 0.61 mg/dL (ref 0.44–1.00)
GFR, Estimated: >60 mL/min (ref >60)
Glucose, Bld: 108 mg/dL — ABNORMAL HIGH (ref 70–99)
Potassium: 4.6 mmol/L (ref 3.5–5.1)
Sodium: 135 mmol/L (ref 135–145)

## 2024-09-11 LAB — URINALYSIS, ROUTINE W REFLEX MICROSCOPIC
Bilirubin Urine: NEGATIVE
Glucose, UA: NEGATIVE mg/dL
Hgb urine dipstick: NEGATIVE
Ketones, ur: NEGATIVE mg/dL
Nitrite: POSITIVE — AB
Protein, ur: 30 mg/dL — AB
Specific Gravity, Urine: 1.012 (ref 1.005–1.030)
WBC, UA: >50 WBC/hpf (ref 0–5)
pH: 8 (ref 5.0–8.0)

## 2024-09-11 MED ORDER — TRAZODONE HCL 50 MG PO TABS
25.0000 mg | ORAL_TABLET | Freq: Every evening | ORAL | Status: DC | PRN
  Filled 2024-09-11 (×2): qty 1

## 2024-09-11 MED ORDER — FLEET ENEMA RE ENEM: 1.0000 | ENEMA | Freq: Once | RECTAL | Status: DC | PRN

## 2024-09-11 MED ORDER — POLYETHYLENE GLYCOL 3350 17 G PO PACK
17.0000 g | PACK | Freq: Two times a day (BID) | ORAL | Status: DC
  Administered 2024-09-12 – 2024-09-22 (×12): 17 g via ORAL
  Filled 2024-09-11 (×24): qty 1

## 2024-09-11 MED ORDER — ACETAMINOPHEN 325 MG PO TABS
325.0000 mg | ORAL_TABLET | ORAL | Status: DC | PRN
  Administered 2024-09-13: 650 mg via ORAL
  Filled 2024-09-11: qty 2

## 2024-09-11 MED ORDER — METHOCARBAMOL 500 MG PO TABS
500.0000 mg | ORAL_TABLET | Freq: Three times a day (TID) | ORAL | Status: DC | PRN
  Administered 2024-09-13 – 2024-09-17 (×2): 500 mg via ORAL
  Filled 2024-09-11 (×2): qty 1

## 2024-09-11 MED ORDER — VITAMIN B-12 1000 MCG PO TABS
1000.0000 ug | ORAL_TABLET | Freq: Every day | ORAL | Status: DC
  Administered 2024-09-11 – 2024-09-22 (×12): 1000 ug via ORAL
  Filled 2024-09-11 (×12): qty 1

## 2024-09-11 MED ORDER — LIDOCAINE HCL URETHRAL/MUCOSAL 2 % EX GEL
CUTANEOUS | Status: DC | PRN
Start: 2024-09-11 — End: 2024-09-26

## 2024-09-11 MED ORDER — CHLORHEXIDINE GLUCONATE CLOTH 2 % EX PADS
6.0000 | MEDICATED_PAD | Freq: Every day | CUTANEOUS | Status: DC
Start: 2024-09-12 — End: 2024-09-12

## 2024-09-11 MED ORDER — ALUM & MAG HYDROXIDE-SIMETH 200-200-20 MG/5ML PO SUSP: 30.0000 mL | ORAL | Status: DC | PRN

## 2024-09-11 MED ORDER — ACETAMINOPHEN 500 MG PO TABS
1000.0000 mg | ORAL_TABLET | Freq: Three times a day (TID) | ORAL | Status: DC
  Administered 2024-09-12 – 2024-09-26 (×41): 1000 mg via ORAL
  Filled 2024-09-11 (×41): qty 2

## 2024-09-11 MED ORDER — OXYCODONE HCL 5 MG PO TABS
2.5000 mg | ORAL_TABLET | ORAL | Status: DC | PRN
  Administered 2024-09-14 – 2024-09-16 (×2): 5 mg via ORAL
  Filled 2024-09-11 (×2): qty 1

## 2024-09-11 MED ORDER — PROCHLORPERAZINE MALEATE 5 MG PO TABS
5.0000 mg | ORAL_TABLET | Freq: Four times a day (QID) | ORAL | Status: DC | PRN
  Administered 2024-09-12: 5 mg via ORAL
  Administered 2024-09-13 – 2024-09-25 (×5): 10 mg via ORAL
  Filled 2024-09-11 (×5): qty 2
  Filled 2024-09-11: qty 1

## 2024-09-11 MED ORDER — DIPHENHYDRAMINE HCL 25 MG PO CAPS: 25.0000 mg | ORAL_CAPSULE | Freq: Four times a day (QID) | ORAL | Status: DC | PRN

## 2024-09-11 MED ORDER — BACITRACIN-NEOMYCIN-POLYMYXIN OINTMENT TUBE: TOPICAL_OINTMENT | CUTANEOUS | Status: DC | PRN

## 2024-09-11 MED ORDER — GUAIFENESIN-DM 100-10 MG/5ML PO SYRP: 5.0000 mL | ORAL_SOLUTION | Freq: Four times a day (QID) | ORAL | Status: DC | PRN

## 2024-09-11 MED ORDER — BETHANECHOL CHLORIDE 10 MG PO TABS
25.0000 mg | ORAL_TABLET | Freq: Three times a day (TID) | ORAL | Status: DC
  Administered 2024-09-11 – 2024-09-17 (×17): 25 mg via ORAL
  Filled 2024-09-11 (×17): qty 3

## 2024-09-11 MED ORDER — AMIODARONE HCL 200 MG PO TABS
200.0000 mg | ORAL_TABLET | Freq: Two times a day (BID) | ORAL | Status: DC
  Administered 2024-09-11: 200 mg via ORAL
  Filled 2024-09-11: qty 1

## 2024-09-11 MED ORDER — BISACODYL 10 MG RE SUPP: 10.0000 mg | Freq: Every day | RECTAL | Status: DC | PRN

## 2024-09-11 MED ORDER — ENOXAPARIN SODIUM 40 MG/0.4ML IJ SOSY
40.0000 mg | PREFILLED_SYRINGE | INTRAMUSCULAR | Status: DC
Start: 2024-09-12 — End: 2024-09-26
  Administered 2024-09-12 – 2024-09-26 (×15): 40 mg via SUBCUTANEOUS
  Filled 2024-09-11 (×15): qty 0.4

## 2024-09-11 MED ORDER — NORTRIPTYLINE HCL 10 MG PO CAPS
30.0000 mg | ORAL_CAPSULE | Freq: Every day | ORAL | Status: DC
  Administered 2024-09-11 – 2024-09-25 (×15): 30 mg via ORAL
  Filled 2024-09-11 (×16): qty 3

## 2024-09-11 MED ORDER — METOPROLOL TARTRATE 50 MG PO TABS
50.0000 mg | ORAL_TABLET | Freq: Two times a day (BID) | ORAL | Status: DC
  Administered 2024-09-11 – 2024-09-26 (×30): 50 mg via ORAL
  Filled 2024-09-11 (×30): qty 1

## 2024-09-11 MED ORDER — SMOG ENEMA
400.0000 mL | Freq: Once | RECTAL | Status: AC
  Administered 2024-09-11: 400 mL via RECTAL
  Filled 2024-09-11: qty 960

## 2024-09-11 MED ORDER — TAMSULOSIN HCL 0.4 MG PO CAPS
0.4000 mg | ORAL_CAPSULE | Freq: Every day | ORAL | Status: DC
  Administered 2024-09-12 – 2024-09-24 (×11): 0.4 mg via ORAL
  Filled 2024-09-11 (×12): qty 1

## 2024-09-11 MED ORDER — PROCHLORPERAZINE EDISYLATE 10 MG/2ML IJ SOLN: 5.0000 mg | Freq: Four times a day (QID) | INTRAMUSCULAR | Status: DC | PRN

## 2024-09-11 MED ORDER — LORATADINE 10 MG PO TABS
10.0000 mg | ORAL_TABLET | Freq: Every day | ORAL | Status: DC
  Administered 2024-09-12 – 2024-09-26 (×15): 10 mg via ORAL
  Filled 2024-09-11 (×15): qty 1

## 2024-09-11 MED ORDER — PANTOPRAZOLE SODIUM 40 MG PO TBEC
40.0000 mg | DELAYED_RELEASE_TABLET | Freq: Every day | ORAL | Status: DC
  Administered 2024-09-12 – 2024-09-26 (×13): 40 mg via ORAL
  Filled 2024-09-11 (×14): qty 1

## 2024-09-11 MED ORDER — VITAMIN C 500 MG PO TABS
500.0000 mg | ORAL_TABLET | Freq: Every day | ORAL | Status: DC
  Administered 2024-09-12 – 2024-09-24 (×13): 500 mg via ORAL
  Filled 2024-09-11 (×13): qty 1

## 2024-09-11 MED ORDER — LIDOCAINE 5 % EX PTCH
2.0000 | MEDICATED_PATCH | CUTANEOUS | Status: DC
  Administered 2024-09-12 – 2024-09-26 (×14): 2 via TRANSDERMAL
  Filled 2024-09-11 (×14): qty 2

## 2024-09-11 MED ORDER — FERROUS SULFATE 325 (65 FE) MG PO TABS
325.0000 mg | ORAL_TABLET | Freq: Every day | ORAL | Status: DC
Start: 2024-09-12 — End: 2024-09-26
  Administered 2024-09-12 – 2024-09-26 (×15): 325 mg via ORAL
  Filled 2024-09-11 (×15): qty 1

## 2024-09-11 MED ORDER — PROCHLORPERAZINE 25 MG RE SUPP: 12.5000 mg | Freq: Four times a day (QID) | RECTAL | Status: DC | PRN

## 2024-09-11 MED ORDER — AMIODARONE HCL 200 MG PO TABS: 200.0000 mg | ORAL_TABLET | Freq: Every day | ORAL | Status: DC

## 2024-09-11 MED ORDER — AMIODARONE HCL 200 MG PO TABS
200.0000 mg | ORAL_TABLET | Freq: Two times a day (BID) | ORAL | Status: DC
  Administered 2024-09-11 – 2024-09-16 (×10): 200 mg via ORAL
  Filled 2024-09-11 (×10): qty 1

## 2024-09-11 MED ORDER — BISACODYL 10 MG RE SUPP
10.0000 mg | Freq: Once | RECTAL | Status: AC
  Administered 2024-09-11: 10 mg via RECTAL
  Filled 2024-09-11: qty 1

## 2024-09-11 MED ORDER — PRAVASTATIN SODIUM 40 MG PO TABS
40.0000 mg | ORAL_TABLET | Freq: Every day | ORAL | Status: DC
  Administered 2024-09-11 – 2024-09-25 (×15): 40 mg via ORAL
  Filled 2024-09-11 (×15): qty 1

## 2024-09-11 NOTE — Progress Notes (Incomplete)
 Review of Systems  Constitutional:  Positive for malaise/fatigue. Negative for chills and fever.  HENT:  Negative for hearing loss.   Eyes:  Negative for blurred vision and double vision.  Respiratory:  Negative for cough and shortness of breath.   Cardiovascular:  Negative for chest pain.  Gastrointestinal:  Positive for constipation and nausea. Negative for abdominal pain, diarrhea and vomiting.  Genitourinary:  Positive for dysuria.  Musculoskeletal:  Positive for back pain, joint pain and neck pain.       Chronic sciatic N pain  Skin:  Positive for rash.  Neurological:  Positive for tingling, sensory change (hands sometimes), speech change, weakness and headaches.  Psychiatric/Behavioral:  The patient does not have insomnia.        General: No apparent distress HEENT: Head is normocephalic, swelling/bruising on her upper face, abrasions on face Heart: Reg rate and rhythm. No murmurs rubs or gallops Chest: CTA bilaterally without wheezes, rales, or rhonchi; no distress Abdomen: Soft, non-tender, non-distended, bowel sounds positive. Extremities: Wrist brace on RUE, LUE in sling, L leg in brace, Boot R foot Psych: Pt's affect is appropriate. Pt is cooperative Skin: multiple bruises on legs and arms Neuro:    Mental Status: AAOx4, memory intact [id number] Speech/Languate: Naming and repetition intact, fluent, follows simple commands CRANIAL NERVES: II: PERRL. Visual fields full III, IV, VI: EOM intact, no gaze preference or deviation V: normal sensation bilaterally VII: no asymmetry VIII: normal hearing to speech IX, X: normal palatal elevation XI: 5/5 head turn and 5/5 shoulder shrug bilaterally XII: Tongue midline   MOTOR: RUE: 5/5 Deltoid, 5/5 Biceps, 5/5 Triceps,5/5 Grip LUE:L arm in sling RLE: HF 4/5, KE 4/5, Boot in place LLE: HF 4-/5, KE brace, ADF 4+/5, APF 4+/5   REFLEXES: 2/4 throughout, bilateral flexor plantar response, no Hoffman's, no  clonus  SENSORY: Normal to touch all 4 extremities

## 2024-09-11 NOTE — TOC Transition Note (Signed)
 Transition of Care [facility]) - Discharge Note   Patient Details  Name: Marie Alvarez MRN: [id number] Date of Birth: [birth year]  Transition of Care [facility]) CM/SW Contact:  Ilda Laskin M, RN Phone Number: 09/11/2024, 3:13 PM   Clinical Narrative:    Patient medically stable for discharge today and bed available at [facility] today.  Plan dc to CIR once bed is ready.     Final next level of care: IP Rehab Facility Barriers to Discharge: Barriers Resolved   Patient Goals and CMS Choice   CMS Medicare.gov Compare Post Acute Care list provided to:: Patient                               Discharge Plan and Services Additional resources added to the After Visit Summary for     Discharge Planning Services: CM Consult Post Acute Care Choice: IP Rehab                               Social Drivers of Health (SDOH) Interventions SDOH Screenings   Food Insecurity: No Food Insecurity (09/04/2024)  Housing: Low Risk  (09/07/2024)  Transportation Needs: No Transportation Needs (09/04/2024)  Utilities: Not At Risk (09/04/2024)  Social Connections: Patient Declined (09/07/2024)  Tobacco Use: Low Risk  (09/03/2024)     Readmission Risk Interventions     No data to display         Mliss MICAEL Fass, RN, BSN  Trauma/Neuro ICU Case Manager [phone]
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
## 2024-09-11 NOTE — Plan of Care (Signed)
  Problem: Clinical Measurements: Goal: Will remain free from infection Outcome: Progressing   Problem: Activity: Goal: Risk for activity intolerance will decrease Outcome: Progressing   Problem: Nutrition: Goal: Adequate nutrition will be maintained Outcome: Progressing   Problem: Coping: Goal: Level of anxiety will decrease Outcome: Progressing   Problem: Elimination: Goal: Will not experience complications related to bowel motility Outcome: Progressing   Problem: Pain Managment: Goal: General experience of comfort will improve and/or be controlled Outcome: Progressing   Problem: Safety: Goal: Ability to remain free from injury will improve Outcome: Progressing   Problem: Skin Integrity: Goal: Risk for impaired skin integrity will decrease Outcome: Progressing
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
## 2024-09-11 NOTE — Progress Notes (Signed)
 Orthopedic Tech Progress Note Patient Details:  Marie Alvarez Feb 15, 1931 [id number]  Patient has BLEDSOE, CAM WALKER BOOT, WRIST SPLINT, SHOULDER SLING  Patient ID: Marie Alvarez, female   DOB: [birth year], 89 y.o.   MRN: [id number]  [name] Pac 09/11/2024, 4:53 PM

## 2024-09-11 NOTE — Progress Notes (Signed)
 Inpatient Rehab Admissions Coordinator:    I have a CIR bed for this Pt. Today. RN may call report to [phone].  PT to admit to CIR for estimated 16-18 days with the goal of dc home with her family.   Leita Kleine, MS, CCC-SLP Rehab Admissions Coordinator  [phone] (celll) [phone] (office)
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
## 2024-09-12 ENCOUNTER — Inpatient Hospital Stay (HOSPITAL_COMMUNITY)

## 2024-09-12 ENCOUNTER — Other Ambulatory Visit: Payer: Self-pay

## 2024-09-12 DIAGNOSIS — R000 Tachycardia, unspecified: Secondary | ICD-10-CM | POA: Diagnosis not present

## 2024-09-12 DIAGNOSIS — T1490XA Injury, unspecified, initial encounter: Secondary | ICD-10-CM | POA: Diagnosis not present

## 2024-09-12 DIAGNOSIS — N3000 Acute cystitis without hematuria: Secondary | ICD-10-CM | POA: Diagnosis not present

## 2024-09-12 DIAGNOSIS — I10 Essential (primary) hypertension: Secondary | ICD-10-CM | POA: Diagnosis not present

## 2024-09-12 LAB — COMPREHENSIVE METABOLIC PANEL WITH GFR
ALT: 36 U/L (ref 0–44)
AST: 32 U/L (ref 15–41)
Albumin: 2.7 g/dL — ABNORMAL LOW (ref 3.5–5.0)
Alkaline Phosphatase: 58 U/L (ref 38–126)
Anion gap: 8 (ref 5–15)
BUN: 20 mg/dL (ref 8–23)
CO2: 22 mmol/L (ref 22–32)
Calcium: 9 mg/dL (ref 8.9–10.3)
Chloride: 102 mmol/L (ref 98–111)
Creatinine, Ser: 0.72 mg/dL (ref 0.44–1.00)
GFR, Estimated: >60 mL/min (ref >60)
Glucose, Bld: 101 mg/dL — ABNORMAL HIGH (ref 70–99)
Potassium: 4.5 mmol/L (ref 3.5–5.1)
Sodium: 132 mmol/L — ABNORMAL LOW (ref 135–145)
Total Bilirubin: 1.2 mg/dL (ref 0.0–1.2)
Total Protein: 5.8 g/dL — ABNORMAL LOW (ref 6.5–8.1)

## 2024-09-12 LAB — CBC WITH DIFFERENTIAL/PLATELET
Abs Immature Granulocytes: 0.07 K/uL (ref 0.00–0.07)
Basophils Absolute: 0 K/uL (ref 0.0–0.1)
Basophils Relative: 1 %
Eosinophils Absolute: 0.4 K/uL (ref 0.0–0.5)
Eosinophils Relative: 4 %
HCT: 32.9 % — ABNORMAL LOW (ref 36.0–46.0)
Hemoglobin: 10.6 g/dL — ABNORMAL LOW (ref 12.0–15.0)
Immature Granulocytes: 1 %
Lymphocytes Relative: 17 %
Lymphs Abs: 1.4 K/uL (ref 0.7–4.0)
MCH: 30.8 pg (ref 26.0–34.0)
MCHC: 32.2 g/dL (ref 30.0–36.0)
MCV: 95.6 fL (ref 80.0–100.0)
Monocytes Absolute: 0.8 K/uL (ref 0.1–1.0)
Monocytes Relative: 9 %
Neutro Abs: 5.8 K/uL (ref 1.7–7.7)
Neutrophils Relative %: 68 %
Platelets: 275 K/uL (ref 150–400)
RBC: 3.44 MIL/uL — ABNORMAL LOW (ref 3.87–5.11)
RDW: 13.8 % (ref 11.5–15.5)
WBC: 8.5 K/uL (ref 4.0–10.5)
nRBC: 0 % (ref 0.0–0.2)

## 2024-09-12 MED ORDER — CEPHALEXIN 250 MG PO CAPS: 250.0000 mg | ORAL_CAPSULE | Freq: Four times a day (QID) | ORAL | Status: DC

## 2024-09-12 MED ORDER — SENNOSIDES-DOCUSATE SODIUM 8.6-50 MG PO TABS
1.0000 | ORAL_TABLET | Freq: Every day | ORAL | Status: DC
  Administered 2024-09-12: 1 via ORAL
  Filled 2024-09-12: qty 1

## 2024-09-12 MED ORDER — CEPHALEXIN 250 MG PO CAPS
250.0000 mg | ORAL_CAPSULE | Freq: Four times a day (QID) | ORAL | Status: DC
  Administered 2024-09-12 – 2024-09-13 (×4): 250 mg via ORAL
  Filled 2024-09-12 (×4): qty 1

## 2024-09-12 NOTE — Progress Notes (Signed)
 Occupational Therapy Session Note  Patient Details  Name: LATREECE MOCHIZUKI MRN: [id number] Date of Birth: 01-Dec-1931  Today's Date: 09/12/2024 OT Individual Time: 1415-1530 OT Individual Time Calculation (min): 75 min    Short Term Goals: Week 1:  OT Short Term Goal 1 (Week 1): Pt will complete 3/3 toileting steps with Mod A OT Short Term Goal 2 (Week 1): Pt will complete UB dressing with Max A OT Short Term Goal 3 (Week 1): Pt will complete UB bathing with Max A OT Short Term Goal 4 (Week 1): Pt will LB dressing with Mod A  Skilled Therapeutic Interventions/Progress Updates:  Skilled OT session completed to address ADL retraining. Pt received seated in WC, agreeable to participate in therapy.    Pt reports increased fatigue and requests to wash hair and return to bed. Grooming completed seated in WC with Max A, pt attempts to wash R side of head, limited d/t pain. Pt requests to void, WC>BSC stand pivot Max A, pt completes toileting hygiene with Max A to manage clothing, pt performs peri care seated with washcloth. Seated at [facility], pt reports increased fatigue and nausea, emesis noted, RN notified. Pt attempts to pull pants up in standing with OT providing Max A to remain standing. BSC>WC stand pivot Max A, VC to adhere to WB precautions of BUE when completing STS. WC>EOB stand pivot Mod A, EOB>Supine Max A to place BLE in bed. Pt requests ginger ale to settle stomach, Mod A to hold cup. Pt supine with bed alarms on, family in room, and all needs met.    Therapy Documentation Precautions:  Precautions Precautions: Fall Required Braces or Orthoses: Sling, Splint/Cast Other Brace: L bledsoe brace, RLE CAM boot, R wrist splint, LUE sling Restrictions Weight Bearing Restrictions Per Provider Order: Yes RUE Weight Bearing Per Provider Order: Weight bear through elbow only LUE Weight Bearing Per Provider Order: Non weight bearing RLE Weight Bearing Per Provider Order: Weight bearing as tolerated  (CAM boot) LLE Weight Bearing Per Provider Order: Weight bearing as tolerated (Bledsoe brace)  Therapy/Group: Individual Therapy  Lareta Bruneau Woods-Chance, MS, OTR/L 09/12/2024, 7:54 AM

## 2024-09-12 NOTE — Progress Notes (Signed)
 Inpatient Rehabilitation  Patient information reviewed and entered into eRehab system by [facility]. Karen Kays., CCC/SLP, PPS Coordinator.  Information including medical coding, functional ability and quality indicators will be reviewed and updated through discharge.

## 2024-09-12 NOTE — Plan of Care (Signed)
  Problem: RH Balance Goal: LTG: Patient will maintain dynamic sitting balance (OT) Description: LTG:  Patient will maintain dynamic sitting balance with assistance during activities of daily living (OT) Flowsheets (Taken 09/12/2024 1716) LTG: Pt will maintain dynamic sitting balance during ADLs with: Supervision/Verbal cueing   Problem: Sit to Stand Goal: LTG:  Patient will perform sit to stand in prep for activites of daily living with assistance level (OT) Description: LTG:  Patient will perform sit to stand in prep for activites of daily living with assistance level (OT) Flowsheets (Taken 09/12/2024 1716) LTG: PT will perform sit to stand in prep for activites of daily living with assistance level: Supervision/Verbal cueing   Problem: RH Eating Goal: LTG Patient will perform eating w/assist, cues/equip (OT) Description: LTG: Patient will perform eating with assist, with/without cues using equipment (OT) Flowsheets (Taken 09/12/2024 1716) LTG: Pt will perform eating with assistance level of: Contact Guard/Touching assist Note: A to open containers    Problem: RH Bathing Goal: LTG Patient will bathe all body parts with assist levels (OT) Description: LTG: Patient will bathe all body parts with assist levels (OT) Flowsheets (Taken 09/12/2024 1716) LTG: Pt will perform bathing with assistance level/cueing: Minimal Assistance - Patient > 75%   Problem: RH Dressing Goal: LTG Patient will perform upper body dressing (OT) Description: LTG Patient will perform upper body dressing with assist, with/without cues (OT). Flowsheets (Taken 09/12/2024 1716) LTG: Pt will perform upper body dressing with assistance level of: Minimal Assistance - Patient > 75% Goal: LTG Patient will perform lower body dressing w/assist (OT) Description: LTG: Patient will perform lower body dressing with assist, with/without cues in positioning using equipment (OT) Flowsheets (Taken 09/12/2024 1716) LTG: Pt will perform  lower body dressing with assistance level of: Minimal Assistance - Patient > 75%   Problem: RH Toileting Goal: LTG Patient will perform toileting task (3/3 steps) with assistance level (OT) Description: LTG: Patient will perform toileting task (3/3 steps) with assistance level (OT)  Flowsheets (Taken 09/12/2024 1716) LTG: Pt will perform toileting task (3/3 steps) with assistance level: Minimal Assistance - Patient > 75%   Problem: RH Functional Use of Upper Extremity Goal: LTG Patient will use RT/LT upper extremity as a (OT) Description: LTG: Patient will use right/left upper extremity as a stabilizer/gross assist/diminished/nondominant/dominant level with assist, with/without cues during functional activity (OT) Flowsheets (Taken 09/12/2024 1716) LTG: Use of upper extremity in functional activities: RUE as gross assist level LTG: Pt will use upper extremity in functional activity with assistance level of: Supervision/Verbal cueing   Problem: RH Toilet Transfers Goal: LTG Patient will perform toilet transfers w/assist (OT) Description: LTG: Patient will perform toilet transfers with assist, with/without cues using equipment (OT) Flowsheets (Taken 09/12/2024 1716) LTG: Pt will perform toilet transfers with assistance level of: Contact Guard/Touching assist   Problem: RH Awareness Goal: LTG: Patient will demonstrate awareness during functional activites type of (OT) Description: LTG: Patient will demonstrate awareness during functional activites type of (OT) Flowsheets (Taken 09/12/2024 1716) LTG: Patient will demonstrate awareness during functional activites type of (OT): Supervision Note: Adhere to NWB precautions on BUE

## 2024-09-12 NOTE — Plan of Care (Signed)
 Patient A&O X4, family at bedside. Small bowel movements this shift. Patient repositioned to maintain turn tolerance, and given assistance with eating. Patient left with call bell in reach bed in lowest position and side rails up.   Problem: Consults Goal: RH GENERAL PATIENT EDUCATION Description: See Patient Education module for education specifics. Outcome: Progressing   Problem: RH BOWEL ELIMINATION Goal: RH STG MANAGE BOWEL WITH ASSISTANCE Description: STG Manage Bowel with supervision Assistance. Outcome: Progressing   Problem: RH BLADDER ELIMINATION Goal: RH STG MANAGE BLADDER WITH ASSISTANCE Description: STG Manage Bladder With supervision Assistance Outcome: Progressing   Problem: RH SKIN INTEGRITY Goal: RH STG SKIN FREE OF INFECTION/BREAKDOWN Description: Manage skin free of infection/breakdown with supervision assistance Outcome: Progressing   Problem: RH PAIN MANAGEMENT Goal: RH STG PAIN MANAGED AT OR BELOW PT'S PAIN GOAL Description: <4 w/ prns Outcome: Progressing   Problem: RH KNOWLEDGE DEFICIT GENERAL Goal: RH STG INCREASE KNOWLEDGE OF SELF CARE AFTER HOSPITALIZATION Description: Manage increase knowledge of swelf care after hospitalization with supervision assistance from son using educational materials provided Outcome: Progressing

## 2024-09-12 NOTE — Progress Notes (Addendum)
 Inpatient Rehabilitation Admission Medication Review by a Pharmacist  A complete drug regimen review was completed for this patient to identify any potential clinically significant medication issues.  High Risk Drug Classes Is patient taking? Indication by Medication  Antipsychotic Yes, as an intravenous medication PRN Prochlorperazine (PO, PR or IV) - nausea  Anticoagulant Yes Enoxaparin  - VTE prophylaxis  Antibiotic Yes Cephalexin  x 5 days - UTI PRN Neomycin ointment - to facial abrasions  Opioid Yes   Antiplatelet No   Hypoglycemics/insulin No   Vasoactive Medication Yes Amiodarone -  SVT/ wide-complex tachycardia Metoprolol  - hx atrial tachycardia, hypertension Tamsulosin - urinary retention  Chemotherapy No   Other No Acetaminophen  scheduled - pain Bethanechol  - urinary retention Vitamin C , Vitamin B-12, ferrous sulfate   - supplements Lidocaine  patches - topical pain relief Loratadine  - allergies Nortriptyline  - insomnia Pantoprazole  - GERD Pravastatin  - hyperlipidemia Miralax , senna-docusate - laxatives  PRNs: Acetaminophen  - mild pain (not to exceed total 4 gm/day) Maalox - indigestion Guaifenesin-dextromethorphan - cough Lidocaine  jelly - for in and out caths Trazodone - sleep Bisacodyl PR, Fleets enema - constipation     Type of Medication Issue Identified Description of Issue Recommendation(s)  Drug Interaction(s) (clinically significant)     Duplicate Therapy     Allergy     No Medication Administration End Date     Incorrect Dose     Additional Drug Therapy Needed     Significant med changes from prior encounter (inform family/care partners about these prior to discharge). New: Amiodarone, Bethanechol , tamsulosin, Lidocaine  patches, Ferrous sulfate , Vitamin C , Miralax , senna-docusate, scheduled Acetaminophen .  Metoprolol  XL (Toprol ) changed to Metoprolol  tartrate (Lopressor ). Communicate changes with patient/family prior to discharge.  Other   Loratadine  substituted for Allegra.  Off Amlodipine , Irbesartan , Vitamin D , Estrace PV cream, PreserVision AREDS2.     Doxazosin  noted on hold due to hypotension. Received 1 dose of 2 mg on 9/24. No fill history found and not on PTA med list. Removed 9/23 when reported not taking when med history taken.  Initially ordered before med history was updated. Back to Allegra and discharge.  Monitor blood pressure, but anticipate staying off Amlodipine  and Irbesartan .  Resume supplements and Estrace PV cream at discharge, or during CIR admit if clinically warranted.   Stay off Doxazosin .    Clinically significant medication issues were identified that warrant physician communication and completion of prescribed/recommended actions by midnight of the next day:  No  Pharmacist comments:  - Cleared for Enoxaparin  for VTE prophylaxis on 9/26. - IV Aminodarone change to 200 mg PO BID on 10/1 > to decrease to daily after 2 weeks. Transition is in place.  Time spent performing this drug regimen review (minutes):  [address]   [name], [place] 09/12/2024 12:26 PM
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
## 2024-09-12 NOTE — Progress Notes (Signed)
 Patient ID: Marie Alvarez, female   DOB: Apr 21, 1931, 89 y.o.   MRN: [id number]  1445: Santina to visit patient to complete initial assessment. She asked if it could be completed tomorrow morning when her son was there. SW will plan to complete assessment tomorrow morning.
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
## 2024-09-12 NOTE — Progress Notes (Signed)
 Patient ID: Marie Alvarez, female   DOB: 1931/01/09, 89 y.o.   MRN: [id number] Met with the patient to review current medical situation, rehab process, team conference and plan of care. Discussed history of UTI; awaiting treatment for new UTI with Constipation addressed.  Patient with right CAM boot and immobilizer left LE with right wrist splint and left shoulder sling. Multi bruises/abrasions. Reports feeling exhausted after bathing.  Lidocaine  at HS; requested k-pad for pain during the day.  Reports nausea for several days; with poor appetite. Encouraged nutritional supplements tween meals; drinks Boost at home.  Reported grand son will assist at discharge.She was independent PTA; getting ready to go out with friends/driving, etc. Continue to follow along to address educational needs to facilitate preparation for discharge. Marie Alvarez

## 2024-09-13 DIAGNOSIS — N3000 Acute cystitis without hematuria: Secondary | ICD-10-CM | POA: Diagnosis not present

## 2024-09-13 DIAGNOSIS — I10 Essential (primary) hypertension: Secondary | ICD-10-CM | POA: Diagnosis not present

## 2024-09-13 DIAGNOSIS — R000 Tachycardia, unspecified: Secondary | ICD-10-CM | POA: Diagnosis not present

## 2024-09-13 DIAGNOSIS — T1490XA Injury, unspecified, initial encounter: Secondary | ICD-10-CM | POA: Diagnosis not present

## 2024-09-13 LAB — URINE CULTURE: Culture: >=100000 — AB

## 2024-09-13 MED ORDER — CEPHALEXIN 250 MG PO CAPS
500.0000 mg | ORAL_CAPSULE | Freq: Two times a day (BID) | ORAL | Status: AC
  Administered 2024-09-13 – 2024-09-17 (×8): 500 mg via ORAL
  Filled 2024-09-13 (×8): qty 2

## 2024-09-13 MED ORDER — SENNOSIDES-DOCUSATE SODIUM 8.6-50 MG PO TABS
2.0000 | ORAL_TABLET | Freq: Every day | ORAL | Status: DC
  Administered 2024-09-13 – 2024-09-15 (×3): 2 via ORAL
  Filled 2024-09-13 (×3): qty 2
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
## 2024-09-13 NOTE — Progress Notes (Signed)
 [facility]patient Rehabilitation Center Individual Statement of Services  Patient Name:  Marie Alvarez  Date:  09/13/2024  Welcome to the Inpatient Rehabilitation Center.  Our goal is to provide you with an individualized program based on your diagnosis and situation, designed to meet your specific needs.  With this comprehensive rehabilitation program, you will be expected to participate in at least 3 hours of rehabilitation therapies Monday-Friday, with modified therapy programming on the weekends.  Your rehabilitation program will include the following services:  Physical Therapy (PT), Occupational Therapy (OT), Speech Therapy (ST), 24 hour per day rehabilitation nursing, Therapeutic Recreaction (TR), Neuropsychology, Care Coordinator, Rehabilitation Medicine, Nutrition Services, and Pharmacy Services  Weekly team conferences will be held on Wednesday to discuss your progress.  Your Inpatient Rehabilitation Care Coordinator will talk with you frequently to get your input and to update you on team discussions.  Team conferences with you and your family in attendance may also be held.  Expected length of stay: 12-14 days  Overall anticipated outcome: Supervision/Verbal cueing    Depending on your progress and recovery, your program may change. Your Inpatient Rehabilitation Care Coordinator will coordinate services and will keep you informed of any changes. Your Inpatient Rehabilitation Care Coordinator's name and contact numbers are listed  below.  The following services may also be recommended but are not provided by the Inpatient Rehabilitation Center:  Driving Evaluations Home Health Rehabiltiation Services Outpatient Rehabilitation Services Vocational Rehabilitation   Arrangements will be made to provide these services after discharge if needed.  Arrangements include referral to agencies that provide these services.  Your insurance has been verified to be: [employer] MEDICARE / BCBS  MEDICARE  Your primary doctor is: Shayne Anes, MD  Pertinent information will be shared with your doctor and your insurance company.  Inpatient Rehabilitation Care Coordinator:  Di'Asia Loreli SIERRAS [phone] or ELIGAH BRINKS  Information discussed with and copy given to patient by: Waverly Loreli, 09/13/2024, 10:37 AM
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
## 2024-09-13 NOTE — Progress Notes (Signed)
 Physical Therapy Note  Patient Details  Name: Marie Alvarez MRN: [id number] Date of Birth: January 24, 1931 Today's Date: 09/13/2024   Pt's plan of care adjusted to 15/7 after speaking with care team and discussed with MD as pt currently unable to tolerate current therapy schedule with OT and PT.    Marie Alvarez Marie Alvarez PT, DPT 09/13/2024, 7:15 AM

## 2024-09-13 NOTE — Plan of Care (Signed)
 Patient calm and cooperative A&O X4, family at bedside. Patient repositioned and female urinal used for bladder continence. Patient given medications crushed in chocolate pudding. Left arm sling and leg braces maintained. Patient left with call bell in reach and bed in lowest position.  Problem: Consults Goal: RH GENERAL PATIENT EDUCATION Description: See Patient Education module for education specifics. Outcome: Progressing   Problem: RH BOWEL ELIMINATION Goal: RH STG MANAGE BOWEL WITH ASSISTANCE Description: STG Manage Bowel with supervision Assistance. Outcome: Progressing   Problem: RH BLADDER ELIMINATION Goal: RH STG MANAGE BLADDER WITH ASSISTANCE Description: STG Manage Bladder With supervision Assistance Outcome: Progressing   Problem: RH SAFETY Goal: RH STG ADHERE TO SAFETY PRECAUTIONS W/ASSISTANCE/DEVICE Description: STG Adhere to Safety Precautions With supervision Assistance/Device. Outcome: Progressing   Problem: RH PAIN MANAGEMENT Goal: RH STG PAIN MANAGED AT OR BELOW PT'S PAIN GOAL Description: <4 w/ prns Outcome: Progressing   Problem: RH KNOWLEDGE DEFICIT GENERAL Goal: RH STG INCREASE KNOWLEDGE OF SELF CARE AFTER HOSPITALIZATION Description: Manage increase knowledge of swelf care after hospitalization with supervision assistance from son using educational materials provided Outcome: Progressing

## 2024-09-13 NOTE — Progress Notes (Signed)
 Patient ID: Marie Alvarez, female   DOB: 03/14/1931, 89 y.o.   MRN: [id number]  Met with patient to deliver SOS.

## 2024-09-13 NOTE — Progress Notes (Signed)
 Occupational Therapy Session Note  Patient Details  Name: Marie Alvarez MRN: [id number] Date of Birth: 01-30-1931  Session 1 Today's Date: 09/13/2024 OT Individual Time: 8953-8799 OT Individual Time Calculation (min): 74 min  Session 2 Today's Date: 09/13/2024 OT Individual Time: 0215-0328 OT Individual Time Calculation (min): 73 min    Short Term Goals: Week 1:  OT Short Term Goal 1 (Week 1): Pt will complete 3/3 toileting steps with Mod A OT Short Term Goal 2 (Week 1): Pt will complete UB dressing with Max A OT Short Term Goal 3 (Week 1): Pt will complete UB bathing with Max A OT Short Term Goal 4 (Week 1): Pt will LB dressing with Mod A  Skilled Therapeutic Interventions/Progress Updates:  Session 1: Skilled OT session completed to address functional transfers. Pt received supine, agreeable to participate in therapy. Pt reports consistent pain in LUE, OT provided pain intervention with K-pad and rest breaks.   OT provided PFRW for RUE to increase standing tolerance and functional mobility. Education provided on WB precautions of RUE and ensuring when reaching standing to use elbow to bear weight. Supine>EOB Min A for extra time and trunk support when seated EOB d/t R lateral lean. 3x STS completed with Mod A to reach standing. Pt requested to void, ambulated to [facility] with PFRW Min A for RW mgmt, continent of BM. Toileting hygiene Max A, pt attempts to manage clothing d/t LOB task d/c, Pt able to manage hygiene for urine, A for BM. Ambulates back to EOB, Min A for PFRW mgmt. Pt returns to bed with bed alarm on and all needs within reach.   Session 2:  Skilled OT session completed to address functional mobility and toileting hygiene. Pt received supine in bed with family present, agreeable to participate in therapy. Pt reports pain with activity in LUE, OT provided pain intervention by including rest breaks and placing K-pad on L shoulder.   Pt requested to void, supine>EOB Supervision for  extra time and adherence to WB precations in RUE. Pt required A to scoot hips to EOB to prep for STS. STS at PFRW with Mod A to reach standing, CGA ambulating to bathroom, VC to decrease trunk flexion while ambulating to bathroom. Pt seated at 3n1 over toilet, toileting hygiene completed with Mod A, pt manages clothing on R side and completes peri care in standing. Pt required seated rest break at task completion, reporting increased fatigue. Mod A to reach standing at PFRW, ambulates CGA to EOB. Pt displaying increased fear when encouraged to ambulate to door and begins sweating, OT provided rest break and encouragement to complete task. STS at PFRW with bed elevated Min A to reach standing. Pt ambulates in hallway outside of room, when instructed to return to EOB, pt requested to sit in WC d/t fatigue. Dependently propelled back to room, stand pivot Mod A. In standing, pt removes LB clothing with Mod A, at knee level pt required A to remove seated EOB. EOB>supine Min A to place BLE in bed. Pt in supine, ed alarm on, with all needs in reach and LPN present. Therapy Documentation Precautions:  Precautions Precautions: Fall Recall of Precautions/Restrictions: Impaired Required Braces or Orthoses: Sling, Splint/Cast Other Brace: L bledsoe brace, RLE CAM boot, R wrist splint, LUE sling Restrictions Weight Bearing Restrictions Per Provider Order: Yes RUE Weight Bearing Per Provider Order: Weight bear through elbow only LUE Weight Bearing Per Provider Order: Non weight bearing RLE Weight Bearing Per Provider Order: Weight bearing as tolerated  LLE Weight Bearing Per Provider Order: Weight bearing as tolerated    Therapy/Group: Individual Therapy  Jathniel Smeltzer Woods-Chance, MS, OTR/L 09/13/2024, 12:10 PM
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
## 2024-09-14 DIAGNOSIS — I10 Essential (primary) hypertension: Secondary | ICD-10-CM | POA: Diagnosis not present

## 2024-09-14 DIAGNOSIS — R339 Retention of urine, unspecified: Secondary | ICD-10-CM | POA: Diagnosis not present

## 2024-09-14 DIAGNOSIS — K5901 Slow transit constipation: Secondary | ICD-10-CM

## 2024-09-14 DIAGNOSIS — T1490XA Injury, unspecified, initial encounter: Secondary | ICD-10-CM | POA: Diagnosis not present

## 2024-09-14 NOTE — Progress Notes (Signed)
 Occupational Therapy Session Note  Patient Details  Name: Marie Alvarez MRN: [id number] Date of Birth: 1931-10-08  Today's Date: 09/14/2024 OT Individual Time: 9083-8995 OT Individual Time Calculation (min): 48 min    Short Term Goals: Week 1:  OT Short Term Goal 1 (Week 1): Pt will complete 3/3 toileting steps with Mod A OT Short Term Goal 2 (Week 1): Pt will complete UB dressing with Max A OT Short Term Goal 3 (Week 1): Pt will complete UB bathing with Max A OT Short Term Goal 4 (Week 1): Pt will LB dressing with Mod A  Skilled Therapeutic Interventions/Progress Updates:  Skilled OT session completed to address ADL retraining and PROM in LUE. Pt received supine in bed, agreeable to participate in therapy. Pt continues to experience nausea throughout session with position changes. Pt reports no pain.  In supine, OT provided PROM on LUE at elbow with shoulder stabilized. Pt reporting increased fear of pain during task;however, no pain noted. OT educates on positioning of LUE in bed to extend elbow and keep elbow and wrist, but limit shoulder movement d/t fx. Pt verbalized understanding.   Supine>EOB Min A to scoot hip EOB with HOB elevated. Pt removes shirt with Mod A to don/doff LUE d/t pain and to limit shoulder movement out of sling. Pt able to pull shirt over head and don/doff RUE with increased time. Pt completes UB bathing with Min A to lift LUE. Pt declines LB bathing/dressing d/t nausea. Pt returns supine with Min A to lift RLE, son present in room with all needs in reach.  Therapy Documentation Precautions:  Precautions Precautions: Fall Recall of Precautions/Restrictions: Impaired Required Braces or Orthoses: Sling, Splint/Cast Other Brace: L bledsoe brace, RLE CAM boot, R wrist splint, LUE sling Restrictions Weight Bearing Restrictions Per Provider Order: Yes RUE Weight Bearing Per Provider Order: Weight bear through elbow only LUE Weight Bearing Per Provider Order: Non weight  bearing RLE Weight Bearing Per Provider Order: Weight bearing as tolerated LLE Weight Bearing Per Provider Order: Weight bearing as tolerated    Therapy/Group: Individual Therapy  Mang [place]ance, MS, OTR/L 09/14/2024, 7:50 AM

## 2024-09-14 NOTE — Progress Notes (Signed)
 Physical Therapy Session Note  Patient Details  Name: Marie Alvarez MRN: [id number] Date of Birth: 1931-10-02  Today's Date: 09/14/2024 PT Individual Time: 0715-0825 and 8980-8940 PT Individual Time Calculation (min): 70 min and 40 min  Short Term Goals: Week 1:  PT Short Term Goal 1 (Week 1): pt will perform bed mobility with min A consistantly PT Short Term Goal 2 (Week 1): pt will transfer sit<>stand with LRAD and mod A consistantly PT Short Term Goal 3 (Week 1): pt will ambulate 69ft with LRAD and min A of 1  Skilled Therapeutic Interventions/Progress Updates:   Treatment Session 1 Received pt semi-reclined in bed asleep with breakfast tray in front of her. Upon wakening, pt agreeable to PT treatment despite fatigue (reported urge to have BM) and reported pain in LUE (RN notified). Session with emphasis on functional mobility/transfers, toileting, dressing, generalized strengthening and endurance, and dynamic standing balance/coordination. Pt transferred semi-reclined<>sitting R EOB with HOB elevated and CGA with ++ time (!) but with difficulty scooting hips to EOB due to bulkiness of bledsoe brace and inability to push with RUE - required mod A to scoot to EOB and get feet on floor.   Pt stood with R PFRW and mod A from EOB and performed stand<>pivot to bedside commode with R PFRW and min A (assist to steer RW). Pt dependent for clothing management and with large BM (required ++ time; see flowsheets). Donned pants dependently and stood from bedside commode with R PFRW and mod A and dependent for hygiene management and to pull brief/pants over hips. Performed stand<>pivot into WC with R PFRW and min A and sat in WC at sink and brushed teeth, washed face, and combed hair with setup assist and ++ time. Pt asking multiple times throughout session can I just lay down? - provided maximal encouragement and redirection to continue session.  Pt transported to/from room in [facility] dependently for time  management purposes. Stood from [facility] with R PFRW and max A in preparation to ambulate - however pt reported feeling dizzy and nauseous: BP 130/67, HR 105bpm, and SPO2 100%. Pt concerns about HR being >100 and requesting HR medication prior to ambulating - RN notified. Pt then reported urge to have another BM. Returned to room and transferred onto bedside commode with R PFRW and max A to stand, min A to pivot. Pt dependent for clothing management and reported feeling dizzy, hot, and sick to her stomach upon sitting. Educated pt on importance of avoiding Valsalva when standing and provided pt with ice water and cool washcloth for relief. Encouraged pt to remain up in [facility] after toileting for upcoming OT session. Pt left seated on commode in care of NT to continue toileting.   Treatment Session 2 Received pt semi-reclined in bed with son at bedside. Pt agreeable to PT treatment and reported pain in L shoulder (premedicated). Session with emphasis on functional mobility/transfers, generalized strengthening and endurance, dynamic standing balance/coordination, and ambulation. Pt transferred semi-reclined<>sitting R EOB with HOB elevated and CGA with ++ time. Pt required min A to scoot L hip to EOB due to bledsoe brace getting stuck. Stood from EOB with R PFRW and mod A and performed stand<>pivot with R PFRW and min A into WC. Discussed getting hospital bed for home use - pt/son requested to wait until closer to D/C to determine need. Also discussed options of getting wedge to elevate HOB and bed assist rail.   Pt transported to/from room in [facility] dependently for time management  purposes. Stood with R PFRW and max A and ambulated 2ft with R PFRW and min A with son providing WC follow. Pt limited by fatigue, lightheadedness, and slight nausea. Returned to room and encouraged pt to remain sitting up for next PT session. Concluded session with pt sitting in [facility] with all needs within reach and son at bedside.   Therapy  Documentation Precautions:  Precautions Precautions: Fall Recall of Precautions/Restrictions: Impaired Required Braces or Orthoses: Sling, Splint/Cast Other Brace: L bledsoe brace, RLE CAM boot, R wrist splint, LUE sling Restrictions Weight Bearing Restrictions Per Provider Order: Yes RUE Weight Bearing Per Provider Order: Weight bear through elbow only LUE Weight Bearing Per Provider Order: Non weight bearing RLE Weight Bearing Per Provider Order: Weight bearing as tolerated LLE Weight Bearing Per Provider Order: Weight bearing as tolerated  Therapy/Group: Individual Therapy Therisa HERO Zaunegger Therisa Stains PT, DPT 09/14/2024, 6:41 AM
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
## 2024-09-14 NOTE — Progress Notes (Signed)
 Physical Therapy Session Note  Patient Details  Name: SECRET KRISTENSEN MRN: [id number] Date of Birth: 29-Oct-1931  Today's Date: 09/14/2024 PT Individual Time: 1120-1155 PT Individual Time Calculation (min): 35 min   Short Term Goals: Week 1:  PT Short Term Goal 1 (Week 1): pt will perform bed mobility with min A consistantly PT Short Term Goal 2 (Week 1): pt will transfer sit<>stand with LRAD and mod A consistantly PT Short Term Goal 3 (Week 1): pt will ambulate 72ft with LRAD and min A of 1  Skilled Therapeutic Interventions/Progress Updates: Patient sitting in [facility] with son present on entrance to room. Patient alert and agreeable to PT session.   Patient reported unrated pain in L UE (7/10 Wong-Baker face scale) and some nausea. Pt premedicated after discussing with nsg. Pt and pt son educated on proper positioning of pillowing being between L UE and lateral torso (up to armpit) vs how it was when son adjusted it (folded and under pt's elbow) to avoid increasing unnecessary scapular elevation. Pt further educated on pain management and to breathe through and that fixating on pain will further increase pts fear of wanting to do anything to progress physically. Pt transported to main gym in [facility]. Pt modA to stand with VC to scoot anteriorly and foot placement. Pt in R PFRW with WC follow for safety and attempted few steps. Pt with reports of dizziness and cued to look at static object at eye level vs down at ground. Pt took few more shuffled steps and required seated rest and began having emetic event in emesis bag. Pt transported back to room (after vomiting ceased) and required modA to stand and minA to pivot to EOB without PFRW. Pt modA to perform sit from EOB<supine, and totalA to scoot to [facility] + 2 via chuck. Nsg notified to provide nausea medication per pt request.   Patient supine in bed at end of session with brakes locked, bed alarm set, and all needs within reach.      Therapy  Documentation Precautions:  Precautions Precautions: Fall Recall of Precautions/Restrictions: Impaired Required Braces or Orthoses: Sling, Splint/Cast Other Brace: L bledsoe brace, RLE CAM boot, R wrist splint, LUE sling Restrictions Weight Bearing Restrictions Per Provider Order: Yes RUE Weight Bearing Per Provider Order: Weight bear through elbow only LUE Weight Bearing Per Provider Order: Non weight bearing RLE Weight Bearing Per Provider Order: Weight bearing as tolerated LLE Weight Bearing Per Provider Order: Weight bearing as tolerated  Therapy/Group: Individual Therapy  Akito Boomhower PTA 09/14/2024, 12:17 PM

## 2024-09-15 DIAGNOSIS — I10 Essential (primary) hypertension: Secondary | ICD-10-CM | POA: Diagnosis not present

## 2024-09-15 DIAGNOSIS — R339 Retention of urine, unspecified: Secondary | ICD-10-CM | POA: Diagnosis not present

## 2024-09-15 DIAGNOSIS — T1490XA Injury, unspecified, initial encounter: Secondary | ICD-10-CM | POA: Diagnosis not present

## 2024-09-15 DIAGNOSIS — K5901 Slow transit constipation: Secondary | ICD-10-CM | POA: Diagnosis not present
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
## 2024-09-16 ENCOUNTER — Inpatient Hospital Stay (HOSPITAL_COMMUNITY)

## 2024-09-16 DIAGNOSIS — F411 Generalized anxiety disorder: Secondary | ICD-10-CM | POA: Diagnosis not present

## 2024-09-16 DIAGNOSIS — T1490XA Injury, unspecified, initial encounter: Secondary | ICD-10-CM | POA: Diagnosis not present

## 2024-09-16 LAB — BASIC METABOLIC PANEL WITH GFR
Anion gap: 9 (ref 5–15)
BUN: 19 mg/dL (ref 8–23)
CO2: 22 mmol/L (ref 22–32)
Calcium: 9.3 mg/dL (ref 8.9–10.3)
Chloride: 103 mmol/L (ref 98–111)
Creatinine, Ser: 0.79 mg/dL (ref 0.44–1.00)
GFR, Estimated: >60 mL/min (ref >60)
Glucose, Bld: 99 mg/dL (ref 70–99)
Potassium: 4.5 mmol/L (ref 3.5–5.1)
Sodium: 134 mmol/L — ABNORMAL LOW (ref 135–145)

## 2024-09-16 LAB — CBC
HCT: 36.1 % (ref 36.0–46.0)
Hemoglobin: 11.6 g/dL — ABNORMAL LOW (ref 12.0–15.0)
MCH: 31.1 pg (ref 26.0–34.0)
MCHC: 32.1 g/dL (ref 30.0–36.0)
MCV: 96.8 fL (ref 80.0–100.0)
Platelets: 346 K/uL (ref 150–400)
RBC: 3.73 MIL/uL — ABNORMAL LOW (ref 3.87–5.11)
RDW: 14.6 % (ref 11.5–15.5)
WBC: 9.6 K/uL (ref 4.0–10.5)
nRBC: 0 % (ref 0.0–0.2)

## 2024-09-16 MED ORDER — AMIODARONE HCL 200 MG PO TABS
200.0000 mg | ORAL_TABLET | Freq: Every day | ORAL | Status: DC
  Administered 2024-09-25 – 2024-09-26 (×2): 200 mg via ORAL
  Filled 2024-09-16 (×2): qty 1

## 2024-09-16 MED ORDER — AMIODARONE HCL 200 MG PO TABS
200.0000 mg | ORAL_TABLET | Freq: Two times a day (BID) | ORAL | Status: AC
  Administered 2024-09-16 – 2024-09-24 (×17): 200 mg via ORAL
  Filled 2024-09-16 (×17): qty 1

## 2024-09-16 MED ORDER — SENNOSIDES-DOCUSATE SODIUM 8.6-50 MG PO TABS
1.0000 | ORAL_TABLET | Freq: Every day | ORAL | Status: DC
  Administered 2024-09-16 – 2024-09-19 (×4): 1 via ORAL
  Filled 2024-09-16 (×8): qty 1

## 2024-09-16 NOTE — Progress Notes (Signed)
 Occupational Therapy Session Note  Patient Details  Name: Marie Alvarez MRN: [id number] Date of Birth: [birth year]  Today's Date: 09/16/2024 OT Individual Time: 8948-8794 OT Individual Time Calculation (min): 74 min    Short Term Goals: Week 1:  OT Short Term Goal 1 (Week 1): Pt will complete 3/3 toileting steps with Mod A OT Short Term Goal 2 (Week 1): Pt will complete UB dressing with Max A OT Short Term Goal 3 (Week 1): Pt will complete UB bathing with Max A OT Short Term Goal 4 (Week 1): Pt will LB dressing with Mod A  Skilled Therapeutic Interventions/Progress Updates:  Pt greeted supine in bed, pt agreeable to OT intervention.      Transfers/bed mobility/functional mobility:  Pt completed supine>long sitting with supervision and then able to transition to EOB by scooting forward and weightbearing in R elbow. Pt did needed light assist to use pad pt scoot L hip forward.  Pt completed sit>stands from w/c with MINA. Practiced standing from couch in apt with MODA, pt with difficulty scooting against leather on couch d/t brace.  Pt completed stand pivot transfers with PFRW with MINA, assist needed to manage RW on L side as pt wears sling on LUE.    ADLs:  Transfers: pt completed stand pivot transfer to [facility] over toilet with PFRW with MINA. Assist needed to manage RW on L side.  Toileting: pt needed MAX A to manage pants during toileting tasks.    Education:  Educated pt on all fxts and showed pt xrays to reinforce carryover of weightbearing restrictions.  Attempted to discuss shower DME however pt unsure of shower set up I.e grab bars, seat options, height of walkin shower threshold. Would recommend completing this education with her son present. Did discuss the fact that pt will likely need more assist at home for IADLs, pt agreeable and receptive.    Exercises:  Provided gentle PROM to L elbow as pt presents with ~ 140* of elbow extension.                Ended session with pt on  toilet, NT aware and coming to assist pt off toilet.                  Therapy Documentation Precautions:  Precautions Precautions: Fall Recall of Precautions/Restrictions: Impaired Required Braces or Orthoses: Sling, Splint/Cast Other Brace: L bledsoe brace, RLE CAM boot, R wrist splint, LUE sling Restrictions Weight Bearing Restrictions Per Provider Order: Yes RUE Weight Bearing Per Provider Order: Weight bear through elbow only LUE Weight Bearing Per Provider Order: Non weight bearing RLE Weight Bearing Per Provider Order: Weight bearing as tolerated LLE Weight Bearing Per Provider Order: Weight bearing as tolerated  Pain: Unrated pain reported in LUE during ROM, rest breaks provided as needed.    Therapy/Group: Individual Therapy  Ronal Mallie Needy 09/16/2024, 12:23 PM
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
## 2024-09-16 NOTE — Progress Notes (Signed)
 Physical Therapy Session Note  Patient Details  Name: ELEXA KIVI MRN: [id number] Date of Birth: 12-Apr-1931  Today's Date: 09/16/2024 PT Individual Time: 0715-0825 PT Individual Time Calculation (min): 70 min   Short Term Goals: Week 1:  PT Short Term Goal 1 (Week 1): pt will perform bed mobility with min A consistantly PT Short Term Goal 2 (Week 1): pt will transfer sit<>stand with LRAD and mod A consistantly PT Short Term Goal 3 (Week 1): pt will ambulate 66ft with LRAD and min A of 1  Skilled Therapeutic Interventions/Progress Updates:   Received pt sitting upright in bed eating breakfast. Pt agreeable to PT treatment and reported continued pain in LUE - RN notified of request for pain medication and Lidocaine  patch. Session with emphasis on functional mobility/transfers, dressing, generalized strengthening and endurance, dynamic standing balance/coordination, and ambulation. Provided pt with washcloth and pt washed face with setup assist. Pt transferred semi-reclined<>sitting R EOB with HOB elevated and close supervision with significantly ++ time - pt with good recall of WB precautions, only using R elbow to push up. Of note, pt able to scoot L hip to EOB without assist this morning! Adjusted bledsoe brace and donned shorts with max A.   Discussed DME for D/C - pt will need 16x16 manual WC and thinks she can get a RW (will ask son). Secure chatted CSW to determine price for platform attachment. Pt stood frm EOB with R PFRW and mod A and required total A to pull shorts over hips. Pt then ambulated 64ft with R PFRW and min A to WC (assist to steer RW). Pt sat in WC at sink and brushed teeth/combed hair with setup assist. Pt transported to/from room in [facility] dependently for time management purposes. Stood with R PFRW and max A (cues to scoot to edge of chair and for anterior weight shifting) and ambulated additional 17ft with R PFRW and CGA - limited by fatigue and discomfort of height difference from  R CAM boot (pt plans on having grandson bring in L tennis shoe). Pt then performed seated knee extension 2x15 bilaterally and hip flexion 2x15 bilaterally with emphasis on LE strength/ROM. Returned to room and encouraged pt to remain sitting up for Neuropsych. Concluded session with pt sitting in [facility] with all needs within reach. Provided pt with protein drink per request.   Therapy Documentation Precautions:  Precautions Precautions: Fall Recall of Precautions/Restrictions: Impaired Required Braces or Orthoses: Sling, Splint/Cast Other Brace: L bledsoe brace, RLE CAM boot, R wrist splint, LUE sling Restrictions Weight Bearing Restrictions Per Provider Order: Yes RUE Weight Bearing Per Provider Order: Weight bear through elbow only LUE Weight Bearing Per Provider Order: Non weight bearing RLE Weight Bearing Per Provider Order: Weight bearing as tolerated LLE Weight Bearing Per Provider Order: Weight bearing as tolerated  Therapy/Group: Individual Therapy Therisa HERO Zaunegger Therisa Stains PT, DPT 09/16/2024, 6:51 AM

## 2024-09-17 DIAGNOSIS — T1490XA Injury, unspecified, initial encounter: Secondary | ICD-10-CM | POA: Diagnosis not present

## 2024-09-17 MED ORDER — BOOST / RESOURCE BREEZE PO LIQD CUSTOM
1.0000 | ORAL | Status: DC
  Administered 2024-09-17 – 2024-09-19 (×2): 1 via ORAL

## 2024-09-17 MED ORDER — BETHANECHOL CHLORIDE 10 MG PO TABS
10.0000 mg | ORAL_TABLET | Freq: Three times a day (TID) | ORAL | Status: DC
  Administered 2024-09-17 – 2024-09-18 (×2): 10 mg via ORAL
  Filled 2024-09-17 (×2): qty 1

## 2024-09-17 MED ORDER — MAGNESIUM GLUCONATE 500 (27 MG) MG PO TABS
250.0000 mg | ORAL_TABLET | Freq: Every day | ORAL | Status: DC
  Administered 2024-09-17 – 2024-09-18 (×2): 250 mg via ORAL
  Filled 2024-09-17 (×2): qty 1

## 2024-09-17 MED ORDER — CIPROFLOXACIN HCL 250 MG PO TABS
250.0000 mg | ORAL_TABLET | Freq: Two times a day (BID) | ORAL | Status: AC
Start: 2024-09-17 — End: 2024-09-20
  Administered 2024-09-17 – 2024-09-20 (×6): 250 mg via ORAL
  Filled 2024-09-17 (×6): qty 1

## 2024-09-17 MED ORDER — NAPROXEN 250 MG PO TABS
250.0000 mg | ORAL_TABLET | Freq: Two times a day (BID) | ORAL | Status: DC
Start: 2024-09-17 — End: 2024-09-25
  Administered 2024-09-17 – 2024-09-25 (×17): 250 mg via ORAL
  Filled 2024-09-17 (×17): qty 1

## 2024-09-17 MED ORDER — DICLOFENAC SODIUM 1 % EX GEL
2.0000 g | Freq: Four times a day (QID) | CUTANEOUS | Status: DC
Start: 2024-09-17 — End: 2024-09-25
  Administered 2024-09-17 – 2024-09-23 (×20): 2 g via TOPICAL
  Filled 2024-09-17: qty 100

## 2024-09-17 NOTE — Progress Notes (Signed)
 Physical Therapy Session Note  Patient Details  Name: Marie Alvarez MRN: [id number] Date of Birth: 1931-09-12  Today's Date: 09/17/2024 PT Individual Time: 1415-1455 PT Individual Time Calculation (min): 40 min   Short Term Goals: Week 1:  PT Short Term Goal 1 (Week 1): pt will perform bed mobility with min A consistantly PT Short Term Goal 2 (Week 1): pt will transfer sit<>stand with LRAD and mod A consistantly PT Short Term Goal 3 (Week 1): pt will ambulate 37ft with LRAD and min A of 1  Skilled Therapeutic Interventions/Progress Updates:   Pt seen to make up missed minutes from AM session. Received pt semi-reclined in bed asleep. Upon wakening, pt agreeable to PT treatment and reported pain 6/10 in LUE (premedicated). Session with emphasis on functional mobility/transfers, toileting, generalized strengthening and endurance, dynamic standing balance/coordination, and ambulation. Pt transferred semi-reclined<>sitting R EOB with HOB elevated and close supervision with ++ time to scoot hips to EOB but demo good adherence to precautions. Donned LUE sling with total A and pt reported urge to toilet. Stood from elevated EOB with R PFRW and min A and performed stand<>pivot onto bedside commode with CGA. Pt required total A for clothing management and with ++ time, continent of bowel and bladder. Stood from bedside commode with R PFRW and heavy min A and dependent for hygiene management. Took seated rest break EOB, then stood with R PFRW and mod A and ambulated 55ft with R PFRW and CGA (occasional min A to steer RW to avoid running into items on L side). Returned to bed and transferred into supine with mod A for BLE management. Removed sling, extended LUE, and elevated on pillows for support. Concluded session with pt semi-reclined in bed, needs within reach, and bed alarm on.   Therapy Documentation Precautions:  Precautions Precautions: Fall Recall of Precautions/Restrictions: Impaired Required Braces  or Orthoses: Sling, Splint/Cast Other Brace: L bledsoe brace, RLE CAM boot, R wrist splint, LUE sling Restrictions Weight Bearing Restrictions Per Provider Order: Yes RUE Weight Bearing Per Provider Order: Non weight bearing LUE Weight Bearing Per Provider Order: Non weight bearing RLE Weight Bearing Per Provider Order: Weight bearing as tolerated LLE Weight Bearing Per Provider Order: Weight bearing as tolerated  Therapy/Group: Individual Therapy Therisa HERO Zaunegger Therisa Stains PT, DPT 09/17/2024, 2:18 PM

## 2024-09-17 NOTE — Progress Notes (Signed)
 Physical Therapy Session Note  Patient Details  Name: Marie Alvarez MRN: [id number] Date of Birth: [birth year]  Today's Date: 09/17/2024 PT Individual Time: 0930-1024 PT Individual Time Calculation (min): 54 min  Today's Date: 09/17/2024 PT Missed Time: 21 Minutes Missed Time Reason: Patient fatigue  Short Term Goals: Week 1:  PT Short Term Goal 1 (Week 1): pt will perform bed mobility with min A consistantly PT Short Term Goal 2 (Week 1): pt will transfer sit<>stand with LRAD and mod A consistantly PT Short Term Goal 3 (Week 1): pt will ambulate 18ft with LRAD and min A of 1  Skilled Therapeutic Interventions/Progress Updates:   Received pt sitting in WC with head on tray table. Pt reluctantly agreed to PT treatment and reported pain 6/10 in L shoulder (premedicated). Session with emphasis on functional mobility/transfers, generalized strengthening and endurance, and dynamic standing balance/coordination. Pt sat in WC at sink and brushed teeth with setup assist. Pt then instructed in WC mobility 156ft using BLE and supervision with 2 rest breaks - emphasis on hamstring strength.  Transported remainder of way to dayroom and encouraged working on Nustep. Pt required maximal encouragement to attempt, repeating I just have no energy over and over again. Pt reports she only took one bite of eggs this morning (doesn't like the food) and is trying to drink ensure. Encouraged protein intake and encouraged pt to eat. Stood from [facility] with PFRW and max A and performed stand<>pivot onto Nustep with R PFRW and min A. Required assist to get BLE on/off footplate and performed seated BLE strengthening on Nustep at level 1 for 10 minutes with emphasis on cardiovascular endurance and knee flexion ROM. Pt concerned about HR - assessed and 79bpm with activity. Total of 361 steps, 0.2 miles, 36 SPM, and 1.4 METs  Pt unable to tolerate any further activity. Stood from [employer] with R PFRW and min A and transferred back  into WC. Returned to room and pt requested to return to bed - stood from [facility] with R PFRW and max A and performed stand<>pivot WC<>bed with R PFRW and min A. Required mod A for BLE management to transfer into supine and scooted to [facility] pushing with BLEs. Pt then reported feeling nauseous - provided pt with emesis bag, ginger ale, crackers, and notified RN. Concluded session with pt semi-reclined in bed, needs within reach, and bed alarm on with visitors at bedside. 21 minutes missed of skilled physical therapy due to fatigue.   Therapy Documentation Precautions:  Precautions Precautions: Fall Recall of Precautions/Restrictions: Impaired Required Braces or Orthoses: Sling, Splint/Cast Other Brace: L bledsoe brace, RLE CAM boot, R wrist splint, LUE sling Restrictions Weight Bearing Restrictions Per Provider Order: Yes RUE Weight Bearing Per Provider Order: Weight bear through elbow only LUE Weight Bearing Per Provider Order: Non weight bearing RLE Weight Bearing Per Provider Order: Weight bearing as tolerated LLE Weight Bearing Per Provider Order: Weight bearing as tolerated  Therapy/Group: Individual Therapy Therisa HERO Zaunegger Therisa Stains PT, DPT 09/17/2024, 6:49 AM

## 2024-09-17 NOTE — Plan of Care (Signed)
  Problem: Consults Goal: RH GENERAL PATIENT EDUCATION Description: See Patient Education module for education specifics. Outcome: Progressing   Problem: RH BOWEL ELIMINATION Goal: RH STG MANAGE BOWEL WITH ASSISTANCE Description: STG Manage Bowel with supervision Assistance. Outcome: Progressing   Problem: RH BLADDER ELIMINATION Goal: RH STG MANAGE BLADDER WITH ASSISTANCE Description: STG Manage Bladder With supervision Assistance Outcome: Progressing   Problem: RH SKIN INTEGRITY Goal: RH STG SKIN FREE OF INFECTION/BREAKDOWN Description: Manage skin free of infection/breakdown with supervision assistance Outcome: Progressing   Problem: RH SAFETY Goal: RH STG ADHERE TO SAFETY PRECAUTIONS W/ASSISTANCE/DEVICE Description: STG Adhere to Safety Precautions With supervision Assistance/Device. Outcome: Progressing   Problem: RH PAIN MANAGEMENT Goal: RH STG PAIN MANAGED AT OR BELOW PT'S PAIN GOAL Description: <4 w/ prns Outcome: Progressing   Problem: RH KNOWLEDGE DEFICIT GENERAL Goal: RH STG INCREASE KNOWLEDGE OF SELF CARE AFTER HOSPITALIZATION Description: Manage increase knowledge of swelf care after hospitalization with supervision assistance from son using educational materials provided Outcome: Progressing

## 2024-09-17 NOTE — Progress Notes (Signed)
 Occupational Therapy Session Note  Patient Details  Name: Marie Alvarez MRN: [id number] Date of Birth: July 22, 1931  Today's Date: 09/17/2024 OT Individual Time: 0800-0902 OT Individual Time Calculation (min): 62 min    Short Term Goals: Week 1:  OT Short Term Goal 1 (Week 1): Pt will complete 3/3 toileting steps with Mod A OT Short Term Goal 2 (Week 1): Pt will complete UB dressing with Max A OT Short Term Goal 3 (Week 1): Pt will complete UB bathing with Max A OT Short Term Goal 4 (Week 1): Pt will LB dressing with Mod A  Skilled Therapeutic Interventions/Progress Updates:  Skilled OT session completed to address ADL retraining and LUE ROM. Pt received supine in bed receiving medications c/o increased nausea, agreeable to participate in therapy. Pt reports 7/10 pain in L shoulder, OT provided pain intervention by repositioning, received pain meds on arrival.   Supine to EOB with supervision, VC for sequencing, pt required increased time to reach EOB. Displaying good adherence to WB precautions in RUE. Once seated EOB, pt continues to experience nausea and dizziness, OT and S-RN encourage fluids, symptoms subside. Bed/LB clothing soiled, pt instructed on LB dressing with Mod A using RUE to remove clothing in standing to knees, OT instructed use of reacher seated to continue removing pants. Touch A to remove from bledsoe brace. Dons with reacher with extra time to get over CAM boot. Pt requested to void, bed>BSC CGA with PFRW. Toileting hygiene with Total A d/t increased nausea during transfer. BSC>WC CGA with PFRW. Nausea subsides. Pt reports not eating prior to medications, education provided on increasing intake prior to requesting meds to reduce nausea and increase energy for activity, pt verbalized understanding and manages tray with set-up A to open containers. OT provided PROM on L elbow and wrist in all planes of motion, ortho PA consulted sling only on OOB with shoulder immobilized. Gentle  Pendulum exs of LUE begins 09/20/2024. Pt reports decrease in pain with sling adjustments.   Pt seated in WC with all needs in reach.   Therapy Documentation Precautions:  Precautions Precautions: Fall Recall of Precautions/Restrictions: Impaired Required Braces or Orthoses: Sling, Splint/Cast Other Brace: L bledsoe brace, RLE CAM boot, R wrist splint, LUE sling Restrictions Weight Bearing Restrictions Per Provider Order: Yes RUE Weight Bearing Per Provider Order: Weight bear through elbow only LUE Weight Bearing Per Provider Order: Non weight bearing RLE Weight Bearing Per Provider Order: Weight bearing as tolerated LLE Weight Bearing Per Provider Order: Weight bearing as tolerated    Therapy/Group: Individual Therapy  Delvon Chipps [place], MS, OTR/L 09/17/2024, 7:55 AM
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
## 2024-09-17 NOTE — Progress Notes (Signed)
 Occupational Therapy Weekly Progress Note  Patient Details  Name: Marie Alvarez MRN: [id number] Date of Birth: 1931/10/17  Beginning of progress report period: September 12, 2024 End of progress report period: September 18, 2024  Patient has met 4 of 4 short term goals. Pt demonstrates progress towards long terms goals. Pt is currently performing UB dressing with Max A d/t BUE WB precautions, LB dressing with Mod A, and toileting with Mod A. Toilet transfers with R PFRW CGA, pt completes task EOB with BSC or ambulating to bathroom with close supervision. Pt is limited by increased fatigue/nausea during therapy sessions, but willing to participate during tasks. Per ortho PA, pt LUE ROM will be upgraded to gentle pendulum swings 09/20/2024 allowing increased opportunity for functional movement of LUE. Pt currently limited by pain, nausea, and overall fatigue, despite limitations, pt responds well to therapy services and will benefit from family training with son and grandson.   Patient continues to demonstrate the following deficits: muscle weakness, decreased cardiorespiratoy endurance, decreased coordination, and decreased standing balance, decreased postural control, and difficulty maintaining precautions and therefore will continue to benefit from skilled OT intervention to enhance overall performance with BADL.  Patient progressing toward long term goals..  Continue plan of care.  OT Short Term Goals Week 1:  OT Short Term Goal 1 (Week 1): Pt will complete 3/3 toileting steps with Mod A OT Short Term Goal 1 - Progress (Week 1): Met OT Short Term Goal 2 (Week 1): Pt will complete UB dressing with Max A OT Short Term Goal 2 - Progress (Week 1): Met OT Short Term Goal 3 (Week 1): Pt will complete UB bathing with Max A OT Short Term Goal 3 - Progress (Week 1): Met OT Short Term Goal 4 (Week 1): Pt will LB dressing with Mod A OT Short Term Goal 4 - Progress (Week 1): Met Week 2:  OT Short Term Goal 1  (Week 2): Pt will complete 3/3 pendulum exs with Min A OT Short Term Goal 2 (Week 2): Pt will complete LB dressing Min A OT Short Term Goal 3 (Week 2): Pt will complete 3/3 steps for toileting Min A OT Short Term Goal 4 (Week 2): Pt will complete UB dressing Mod A OT Short Term Goal 5 (Week 2): Pt will complete LB dressing Min A   Therapy Documentation Precautions:  Precautions Precautions: Fall Recall of Precautions/Restrictions: Impaired Required Braces or Orthoses: Sling, Splint/Cast Other Brace: L bledsoe brace, RLE CAM boot, R wrist splint, LUE sling Restrictions Weight Bearing Restrictions Per Provider Order: Yes RUE Weight Bearing Per Provider Order: Weight bear through elbow only LUE Weight Bearing Per Provider Order: Non weight bearing RLE Weight Bearing Per Provider Order: Weight bearing as tolerated LLE Weight Bearing Per Provider Order: Weight bearing as tolerated  Jayden Rudge Woods-Chance, MS, OTR/L 09/17/2024, 12:14 PM

## 2024-09-18 ENCOUNTER — Other Ambulatory Visit (HOSPITAL_COMMUNITY): Payer: Self-pay

## 2024-09-18 DIAGNOSIS — T1490XA Injury, unspecified, initial encounter: Secondary | ICD-10-CM | POA: Diagnosis not present

## 2024-09-18 MED ORDER — BETHANECHOL CHLORIDE 10 MG PO TABS
5.0000 mg | ORAL_TABLET | Freq: Three times a day (TID) | ORAL | Status: DC
  Administered 2024-09-18 – 2024-09-19 (×3): 5 mg via ORAL
  Filled 2024-09-18 (×3): qty 1

## 2024-09-18 MED ORDER — MAGNESIUM GLUCONATE 500 (27 MG) MG PO TABS
500.0000 mg | ORAL_TABLET | Freq: Every day | ORAL | Status: DC
  Administered 2024-09-19 – 2024-09-22 (×4): 500 mg via ORAL
  Filled 2024-09-18 (×4): qty 1

## 2024-09-18 MED ORDER — SUZETRIGINE 50 MG PO TABS
50.0000 mg | ORAL_TABLET | Freq: Two times a day (BID) | ORAL | Status: DC
  Administered 2024-09-18 – 2024-09-26 (×17): 50 mg via ORAL
  Filled 2024-09-18 (×21): qty 100

## 2024-09-18 MED ORDER — ESTRADIOL 0.1 MG/GM VA CREA
1.0000 | TOPICAL_CREAM | VAGINAL | Status: DC
  Administered 2024-09-18 – 2024-09-20 (×2): 1 via VAGINAL
  Filled 2024-09-18 (×2): qty 42.5

## 2024-09-18 MED ORDER — ACETAMINOPHEN ER 650 MG PO TBCR: 650.0000 mg | EXTENDED_RELEASE_TABLET | Freq: Three times a day (TID) | ORAL | Status: DC

## 2024-09-18 MED ORDER — NON FORMULARY: 50.0000 mg | Freq: Two times a day (BID) | Status: DC

## 2024-09-18 MED ORDER — JOURNAVX 50 MG PO TABS
50.0000 mg | ORAL_TABLET | Freq: Two times a day (BID) | ORAL | 0 refills | Status: DC
  Filled 2024-09-18: qty 28, 14d supply, fill #0
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
## 2024-09-18 NOTE — Plan of Care (Signed)
  Problem: Consults Goal: RH GENERAL PATIENT EDUCATION Description: See Patient Education module for education specifics. Outcome: Progressing   Problem: RH BOWEL ELIMINATION Goal: RH STG MANAGE BOWEL WITH ASSISTANCE Description: STG Manage Bowel with supervision Assistance. Outcome: Progressing   Problem: RH BLADDER ELIMINATION Goal: RH STG MANAGE BLADDER WITH ASSISTANCE Description: STG Manage Bladder With supervision Assistance Outcome: Progressing   Problem: RH SKIN INTEGRITY Goal: RH STG SKIN FREE OF INFECTION/BREAKDOWN Description: Manage skin free of infection/breakdown with supervision assistance Outcome: Progressing   Problem: RH SAFETY Goal: RH STG ADHERE TO SAFETY PRECAUTIONS W/ASSISTANCE/DEVICE Description: STG Adhere to Safety Precautions With supervision Assistance/Device. Outcome: Progressing   Problem: RH PAIN MANAGEMENT Goal: RH STG PAIN MANAGED AT OR BELOW PT'S PAIN GOAL Description: <4 w/ prns Outcome: Progressing   Problem: RH KNOWLEDGE DEFICIT GENERAL Goal: RH STG INCREASE KNOWLEDGE OF SELF CARE AFTER HOSPITALIZATION Description: Manage increase knowledge of swelf care after hospitalization with supervision assistance from son using educational materials provided Outcome: Progressing

## 2024-09-18 NOTE — Progress Notes (Signed)
 Physical Therapy Weekly Progress Note  Patient Details  Name: RAQUELLE PIETRO MRN: [id number] Date of Birth: [birth year]  Beginning of progress report period: September 12, 2024 End of progress report period: September 18, 2024  Patient has met 1 of 3 short term goals. Pt demonstrates slow but steady progress towards long term goals. Pt is currently able to roll R and transfer supine<>sitting EOB with supervision, ++ time, and with heavy reliance on hospital bed features but requires mod A to transition from sit<>supine for BLE management. Pt is able to perform sit<>stands transfers with R PFRW ranging from min-max A depending on height of surface and pain/fatigue level. Pt is ambulating up to 82ft with R PFRW and CGA and is able to propel WC 158ft using BLEs and supervision. Pt remains limited by pain, fatigue, and weakness and will require hands on family education training with son prior to discharge.   Patient continues to demonstrate the following deficits muscle weakness and muscle joint tightness, decreased cardiorespiratoy endurance, and decreased standing balance, decreased postural control, decreased balance strategies, and difficulty maintaining precautions and therefore will continue to benefit from skilled PT intervention to increase functional independence with mobility.  Patient progressing toward long term goals..  Continue plan of care.  PT Short Term Goals Week 1:  PT Short Term Goal 1 (Week 1): pt will perform bed mobility with min A consistantly PT Short Term Goal 1 - Progress (Week 1): Progressing toward goal PT Short Term Goal 2 (Week 1): pt will transfer sit<>stand with LRAD and mod A consistantly PT Short Term Goal 2 - Progress (Week 1): Progressing toward goal PT Short Term Goal 3 (Week 1): pt will ambulate 57ft with LRAD and min A of 1 PT Short Term Goal 3 - Progress (Week 1): Met Week 2:  PT Short Term Goal 1 (Week 2): pt will perform bed mobility with min A consistantly PT  Short Term Goal 2 (Week 2): pt will transfer sit<>stand with LRAD and mod A consistantly PT Short Term Goal 3 (Week 2): pt will ambulate 160ft with LRAD and CGA  Skilled Therapeutic Interventions/Progress Updates:  Ambulation/gait training;Discharge planning;Functional mobility training;Psychosocial support;Therapeutic Activities;Visual/perceptual remediation/compensation;Balance/vestibular training;Disease management/prevention;Neuromuscular re-education;Skin care/wound management;Therapeutic Exercise;Wheelchair propulsion/positioning;Cognitive remediation/compensation;DME/adaptive equipment instruction;Pain management;Splinting/orthotics;UE/LE Strength taining/ROM;Community reintegration;Functional electrical stimulation;Patient/family education;Stair training;UE/LE Coordination activities   Therapy Documentation Precautions:  Precautions Precautions: Fall Recall of Precautions/Restrictions: Impaired Required Braces or Orthoses: Sling, Splint/Cast Other Brace: L bledsoe brace, RLE CAM boot, R wrist splint, LUE sling Restrictions Weight Bearing Restrictions Per Provider Order: Yes RUE Weight Bearing Per Provider Order: Non weight bearing LUE Weight Bearing Per Provider Order: Non weight bearing RLE Weight Bearing Per Provider Order: Weight bearing as tolerated LLE Weight Bearing Per Provider Order: Weight bearing as tolerated  Therapy/Group: Individual Therapy Therisa HERO Zaunegger Therisa Stains PT, DPT 09/18/2024, 7:19 AM

## 2024-09-18 NOTE — Progress Notes (Signed)
 Physical Therapy Session Note  Patient Details  Name: Marie Alvarez MRN: [id number] Date of Birth: 1931/02/06  Today's Date: 09/18/2024 PT Individual Time: 1400-1455 PT Individual Time Calculation (min): 55 min   Short Term Goals: Week 1:  PT Short Term Goal 1 (Week 1): pt will perform bed mobility with min A consistantly PT Short Term Goal 2 (Week 1): pt will transfer sit<>stand with LRAD and mod A consistantly PT Short Term Goal 3 (Week 1): pt will ambulate 46ft with LRAD and min A of 1  Skilled Therapeutic Interventions/Progress Updates:   Received pt sitting in recliner with son and visitors present. Pt agreeable to PT treatment and reported pain 5/10 in LUE (premedicated). Session with emphasis on functional mobility/transfers, generalized strengthening and endurance, dynamic standing balance/coordination, and ambulation. Adjusted bledsoe brace and secure chatted MD for smaller size. Donned L shoe with max A and stood from recliner with R PRRW and min A. Pt ambulated 169ft with R PFRW and CGA to main therapy gym. Stood from [facility] with R PFRW and heavy mod A and performed stand<>pivot onto mat with R PFRW and CGA. Stood from [facility] with R PFRW and min A x 6 trials and performed alternating toe taps to 6in step 2x10 bilaterally with CGA for balance. Pt then performed the following standing exercises with emphasis on LE strength/ROM: -alternating marches 2x10 bilaterally -hip abduction 2x10 bilaterally -mini squats 2x20 -hip extension x10 bilaterally  Returned to room, stood with R PFRW and mod A, and performed stand<>pivot into recliner with R PFRW and CGA. Concluded session with pt sitting in recliner with all needs within reach and sister at bedside.   Therapy Documentation Precautions:  Precautions Precautions: Fall Recall of Precautions/Restrictions: Impaired Required Braces or Orthoses: Sling, Splint/Cast Other Brace: L bledsoe brace, RLE CAM boot, R wrist splint, LUE  sling Restrictions Weight Bearing Restrictions Per Provider Order: Yes RUE Weight Bearing Per Provider Order: Non weight bearing LUE Weight Bearing Per Provider Order: Non weight bearing RLE Weight Bearing Per Provider Order: Weight bearing as tolerated LLE Weight Bearing Per Provider Order: Weight bearing as tolerated  Therapy/Group: Individual Therapy Therisa HERO Zaunegger Therisa Stains PT, DPT 09/18/2024, 6:54 AM

## 2024-09-18 NOTE — Discharge Instructions (Addendum)
 Inpatient Rehab Discharge Instructions  Marie Alvarez Discharge date and time:    Activities/Precautions/ Functional Status: Activity: no lifting, driving, or strenuous exercise till cleared by MD  Diet: regular diet Wound Care: keep wound clean and dry   Functional status:  ___ No restrictions     ___ Walk up steps independently _X__ 24/7 supervision/assistance   ___ Walk up steps with assistance ___ Intermittent supervision/assistance  ___ Bathe/dress independently ___ Walk with walker     ___ Bathe/dress with assistance ___ Walk Independently    ___ Shower independently ___ Walk with assistance    ___ Shower with assistance _X__ No alcohol     ___ Return to work/school ________  Special Instructions: Wear brace on left knee, left ankle, right wrist and sling Left arm. No weight on Left arm. Can bear weight on right elbow--none on right wrist.    My questions have been answered and I understand these instructions. I will adhere to these goals and the provided educational materials after my discharge from the hospital.  Patient/Caregiver Signature _______________________________ Date __________  Clinician Signature _______________________________________ Date __________  Please bring this form and your medication list with you to all your follow-up doctor's appointments.

## 2024-09-18 NOTE — Progress Notes (Signed)
 Occupational Therapy Session Note  Patient Details  Name: Marie Alvarez MRN: [id number] Date of Birth: 1931/11/24  Today's Date: 09/18/2024 OT Individual Time: 0920-1018 OT Individual Time Calculation (min): 58 min    Short Term Goals: Week 1:  OT Short Term Goal 1 (Week 1): Pt will complete 3/3 toileting steps with Mod A OT Short Term Goal 2 (Week 1): Pt will complete UB dressing with Max A OT Short Term Goal 3 (Week 1): Pt will complete UB bathing with Max A OT Short Term Goal 4 (Week 1): Pt will LB dressing with Mod A  Skilled Therapeutic Interventions/Progress Updates:  Skilled OT session completed to address ADL retraining. Pt received seated in recliner with nursing present, agreeable to participate in therapy. Pt reports unrated pain in LUE, OT provided pain intervention by donning sling.   OT reinforced need to wear sling OOB on LUE, pt verbalized understanding, reports difficulty with donning at 6am. OT dependently donned sling to LUE. LB dressing completed with Mod A to weave in BLE, t able to pull up pants seated to knee level, standing at PFRW pt completes task with increased time. UB dressing completed with Max A to don/doff shirt, d/t pain LUE pt has decreased participation with task, OT educates on dressing techniques to limit shoulder movement at LUE, pt displays understanding completing task with extra time. Pt dependently propelled to gym to complete toilet hygiene simulation activity in standing. Mod A to reach standing at PFRW, pt attempts to stand by WB in RUE, VC for adherence to WB precautions. Pt able to remove 8/8 clothes pins in standing from posterior region with CGA for balance during activity. Pt returned to room requesting to void, OT transfers care to NT d/t time. Pt seated in WC with all needs in reach.   Therapy Documentation Precautions:  Precautions Precautions: Fall Recall of Precautions/Restrictions: Impaired Required Braces or Orthoses: Sling,  Splint/Cast Other Brace: L bledsoe brace, RLE CAM boot, R wrist splint, LUE sling Restrictions Weight Bearing Restrictions Per Provider Order: Yes RUE Weight Bearing Per Provider Order: Non weight bearing LUE Weight Bearing Per Provider Order: Non weight bearing RLE Weight Bearing Per Provider Order: Weight bearing as tolerated LLE Weight Bearing Per Provider Order: Weight bearing as tolerated   Therapy/Group: Individual Therapy  Sontee Desena Woods-Chance, MS, OTR/L 09/18/2024, 7:54 AM
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
## 2024-09-18 NOTE — Progress Notes (Signed)
 Orthopedic Tech Progress Note Patient Details:  Marie Alvarez August 23, 1931 [id number]  Spoke with [name] earlier regarding KNEE IMMOBILIZER for patient. Patient has a BLEDSOE BRACE that goes to the left knee that goes on when patient is up, but MD also requested a KNEE IMMOBILIZER for the same leg. RN stated she would reach out to MD to get clarification on knee immobilizer    Patient ID: Marie Alvarez, female   DOB: 1931/11/13, 89 y.o.   MRN: [id number]  Marie Alvarez Pac 09/18/2024, 5:54 PM

## 2024-09-19 DIAGNOSIS — T1490XA Injury, unspecified, initial encounter: Secondary | ICD-10-CM | POA: Diagnosis not present

## 2024-09-19 LAB — CBC
HCT: 37.8 % (ref 36.0–46.0)
Hemoglobin: 12.1 g/dL (ref 12.0–15.0)
MCH: 31.8 pg (ref 26.0–34.0)
MCHC: 32 g/dL (ref 30.0–36.0)
MCV: 99.2 fL (ref 80.0–100.0)
Platelets: 151 K/uL (ref 150–400)
RBC: 3.81 MIL/uL — ABNORMAL LOW (ref 3.87–5.11)
RDW: 15 % (ref 11.5–15.5)
WBC: 6.3 K/uL (ref 4.0–10.5)
nRBC: 0 % (ref 0.0–0.2)

## 2024-09-19 LAB — BASIC METABOLIC PANEL WITH GFR
Anion gap: 12 (ref 5–15)
BUN: 23 mg/dL (ref 8–23)
CO2: 20 mmol/L — ABNORMAL LOW (ref 22–32)
Calcium: 9.3 mg/dL (ref 8.9–10.3)
Chloride: 102 mmol/L (ref 98–111)
Creatinine, Ser: 0.83 mg/dL (ref 0.44–1.00)
GFR, Estimated: >60 mL/min (ref >60)
Glucose, Bld: 102 mg/dL — ABNORMAL HIGH (ref 70–99)
Potassium: 4.8 mmol/L (ref 3.5–5.1)
Sodium: 134 mmol/L — ABNORMAL LOW (ref 135–145)

## 2024-09-19 MED ORDER — BETHANECHOL CHLORIDE 10 MG PO TABS
5.0000 mg | ORAL_TABLET | Freq: Two times a day (BID) | ORAL | Status: DC
  Administered 2024-09-19 – 2024-09-23 (×8): 5 mg via ORAL
  Filled 2024-09-19 (×8): qty 1

## 2024-09-19 MED ORDER — ENSURE PLUS HIGH PROTEIN PO LIQD
237.0000 mL | Freq: Two times a day (BID) | ORAL | Status: DC
  Administered 2024-09-19 – 2024-09-23 (×7): 237 mL via ORAL

## 2024-09-19 NOTE — Plan of Care (Signed)
  Problem: Consults Goal: RH GENERAL PATIENT EDUCATION Description: See Patient Education module for education specifics. Outcome: Progressing   Problem: RH BOWEL ELIMINATION Goal: RH STG MANAGE BOWEL WITH ASSISTANCE Description: STG Manage Bowel with supervision Assistance. Outcome: Progressing   Problem: RH BLADDER ELIMINATION Goal: RH STG MANAGE BLADDER WITH ASSISTANCE Description: STG Manage Bladder With supervision Assistance Outcome: Progressing   Problem: RH SKIN INTEGRITY Goal: RH STG SKIN FREE OF INFECTION/BREAKDOWN Description: Manage skin free of infection/breakdown with supervision assistance Outcome: Progressing   Problem: RH SAFETY Goal: RH STG ADHERE TO SAFETY PRECAUTIONS W/ASSISTANCE/DEVICE Description: STG Adhere to Safety Precautions With supervision Assistance/Device. Outcome: Progressing   Problem: RH PAIN MANAGEMENT Goal: RH STG PAIN MANAGED AT OR BELOW PT'S PAIN GOAL Description: <4 w/ prns Outcome: Progressing   Problem: RH KNOWLEDGE DEFICIT GENERAL Goal: RH STG INCREASE KNOWLEDGE OF SELF CARE AFTER HOSPITALIZATION Description: Manage increase knowledge of swelf care after hospitalization with supervision assistance from son using educational materials provided Outcome: Progressing

## 2024-09-19 NOTE — Progress Notes (Signed)
 Occupational Therapy Session Note  Patient Details  Name: Marie Alvarez MRN: [id number] Date of Birth: 1931/02/09  Today's Date: 09/19/2024 OT Individual Time: 9196-9096 OT Individual Time Calculation (min): 60 min    Short Term Goals: Week 1:  OT Short Term Goal 1 (Week 1): Pt will complete 3/3 toileting steps with Mod A OT Short Term Goal 2 (Week 1): Pt will complete UB dressing with Max A OT Short Term Goal 3 (Week 1): Pt will complete UB bathing with Max A OT Short Term Goal 4 (Week 1): Pt will LB dressing with Mod A  Skilled Therapeutic Interventions/Progress Updates:  Skilled OT session completed to address ADL retraining and brace management. Pt received supine in bed, agreeable to participate in therapy. Pt reports no pain.  LB bathing completed dependently to wash feet and legs with bledsoe brace and CAM boot removal with RN assist to keep LLE in extension during bathing, OT provided education on importance of skin checks to maintain skin integrity at brace sights. RN reports RUE brace removal over night  and bandage care, pt politely declines remove of RUE wrist splint. Pt requested to void, Supine>EOB Min A with HOB elevated, pt displaying good adherence to WB precautions througout session during transfers EOB>BSC supervision with PFRW d/t LOB in LUE. Toileting hygiene completed with Mod A for clothing mgmt on L side. Pt completes peri care in standing with PFRW. Pt completes LB dressing with Mod A, pt able to weave BLE in clothing, A for technique to don clothing and pull up on L side in standing. UB dressing completed with Mod A for extra time and to don/doff LUE sling. Pt ambulates with PFRW CGA to recliner, set-up A provided to eat breakfast. Pt seated in recliner, direct hand off to nursing staff and all needs met.  Therapy Documentation Precautions:  Precautions Precautions: Fall Recall of Precautions/Restrictions: Impaired Required Braces or Orthoses: Sling, Splint/Cast Other  Brace: L bledsoe brace, RLE CAM boot, R wrist splint, LUE sling Restrictions Weight Bearing Restrictions Per Provider Order: Yes RUE Weight Bearing Per Provider Order: Non weight bearing LUE Weight Bearing Per Provider Order: Non weight bearing RLE Weight Bearing Per Provider Order: Weight bearing as tolerated LLE Weight Bearing Per Provider Order: Weight bearing as tolerated    Therapy/Group: Individual Therapy  Hikaru Delorenzo Woods-Chance, MS, OTR/L 09/19/2024, 7:49 AM

## 2024-09-19 NOTE — Progress Notes (Signed)
 Physical Therapy Session Note  Patient Details  Name: MAYOLA MCBAIN MRN: [id number] Date of Birth: 07/19/1931  Today's Date: 09/19/2024 PT Individual Time: 1000-1110 PT Individual Time Calculation (min): 70 min   Short Term Goals: Week 1:  PT Short Term Goal 1 (Week 1): pt will perform bed mobility with min A consistantly PT Short Term Goal 1 - Progress (Week 1): Progressing toward goal PT Short Term Goal 2 (Week 1): pt will transfer sit<>stand with LRAD and mod A consistantly PT Short Term Goal 2 - Progress (Week 1): Progressing toward goal PT Short Term Goal 3 (Week 1): pt will ambulate 56ft with LRAD and min A of 1 PT Short Term Goal 3 - Progress (Week 1): Met Week 2:  PT Short Term Goal 1 (Week 2): pt will perform bed mobility with min A consistantly PT Short Term Goal 2 (Week 2): pt will transfer sit<>stand with LRAD and mod A consistantly PT Short Term Goal 3 (Week 2): pt will ambulate 164ft with LRAD and CGA  Skilled Therapeutic Interventions/Progress Updates:  Received pt sitting in recliner with son at bedside. Pt agreeable to PT treatment, and reported pain 5/10 in LUE (premedicated). Session with emphasis on functional mobility/transfers, generalized strengthening and endurance, dynamic standing balance/coordination, and ambulation. Discussed D/C date of next Thursday 10/16 and son requesting to [place] with CSW - CSW notified. Donned L shoe with max A and pt performed all sit<>stands with R PFRW and CGA/min A from elevated surfaces but required up to mod A to stand from lower surfaces. Transferred into WC and sat in WC at sink and brushed teeth with setup assist. Stood from [facility] and ambulated 145ft with R PFRW and CGA fading to supervision - limited by fatigue. Pt then performed WC mobility 154ft using BLE and supervision to dayroom with emphasis on LE strength.   Stood from [facility] with R PFRW and mod fading to min A x 3 trials and worked on dynamic standing balance without UE support tossing  horseshoes using RLE with CGA for balance x 3 trials with seated rest breaks in between trials. Pt tearful and emotional during session regarding current status and briefly discussed participation in stress management group tomorrow. Returned to room and transferred into recliner via stand<>pivot with CGA (mod A to stand). Concluded session with pt sitting in recliner with all needs within reach.   Therapy Documentation Precautions:  Precautions Precautions: Fall Recall of Precautions/Restrictions: Impaired Required Braces or Orthoses: Sling, Splint/Cast Other Brace: L bledsoe brace, RLE CAM boot, R wrist splint, LUE sling Restrictions Weight Bearing Restrictions Per Provider Order: Yes RUE Weight Bearing Per Provider Order: Non weight bearing LUE Weight Bearing Per Provider Order: Non weight bearing RLE Weight Bearing Per Provider Order: Weight bearing as tolerated LLE Weight Bearing Per Provider Order: Weight bearing as tolerated  Therapy/Group: Individual Therapy Therisa HERO Zaunegger Therisa Stains PT, DPT 09/19/2024, 6:49 AM

## 2024-09-19 NOTE — Progress Notes (Signed)
 Patient ID: Marie Alvarez, female   DOB: 1931/03/09, 89 y.o.   MRN: [id number]  Have reviewed team conference with pt and family. Both aware and agreeable with targeted d/c date of 10/16 and goals of Supervision/Verbal cueing.   Will set up for OP PT/OT.
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
## 2024-09-20 DIAGNOSIS — T1490XA Injury, unspecified, initial encounter: Secondary | ICD-10-CM | POA: Diagnosis not present

## 2024-09-20 DIAGNOSIS — R52 Pain, unspecified: Secondary | ICD-10-CM | POA: Diagnosis not present

## 2024-09-20 NOTE — Progress Notes (Signed)
 Physical Therapy Session Note  Patient Details  Name: Marie Alvarez MRN: [id number] Date of Birth: [birth year]  Today's Date: 09/20/2024 PT Individual Time: 1300-1413 PT Individual Time Calculation (min): 73 min   Short Term Goals: Week 2:  PT Short Term Goal 1 (Week 2): pt will perform bed mobility with min A consistantly PT Short Term Goal 2 (Week 2): pt will transfer sit<>stand with LRAD and mod A consistantly PT Short Term Goal 3 (Week 2): pt will ambulate 151ft with LRAD and CGA  Skilled Therapeutic Interventions/Progress Updates: Pt presents sitting in w/c finishing lunch and agreeable to therapy.  Pt transfers sit to stand and amb w/ platform walker into BR w/ CGA, slight assist for taking walker u ramped entrance to BR.  Pt required min A for managing clothing.  Pt continent of bladder in toilet, charted in Flowsheets.  Pt transfers on/off toilet w/ min A.  Pt amb to room and returned to w/c.  Scrub pants retrieved for comfort.  Pt stood at sink to brush hair.  Pt wheeled to small gym for energy conservation 2/2 c/o fatigue.  Pt performed multiple sit to stand transfers w/o AD and mod A, cues for scooting forward and increased forward lean.  Pt reaching to [place] on mirror to challenge balance.  Pt reaching for 1 colr sequence to 5 color sequence reaching outside of BOS.  Pt requires extended seated rest breaks 2/2 fatigue - more than normal.  Pt performed LE there ex of hip flexion and LAQ 2 x 10.  Pt amb x 80' w/ R platform walker and CGA, cues for increased foot clearance.  Pt amb from hallway into room and turned to approach bed w/ CGA.  Pt required min/CGA for LLE sit t supine transfer but pt initiates and performs almost completely.  Pt able to scoot to center of bed.  Bed alarm on and all needs in reach, bed rails up per pt request.     Therapy Documentation Precautions:  Precautions Precautions: Fall Recall of Precautions/Restrictions: Impaired Required Braces or Orthoses: Sling,  Splint/Cast Other Brace: L bledsoe brace, RLE CAM boot, R wrist splint, LUE sling Restrictions Weight Bearing Restrictions Per Provider Order: Yes RUE Weight Bearing Per Provider Order: Non weight bearing LUE Weight Bearing Per Provider Order: Non weight bearing RLE Weight Bearing Per Provider Order: Weight bearing as tolerated LLE Weight Bearing Per Provider Order: Weight bearing as tolerated General:   Vital Signs:   Pain:3/10 L shoulder     Therapy/Group: Individual Therapy  Rafe Mackowski P Salem Mastrogiovanni 09/20/2024, 2:22 PM

## 2024-09-20 NOTE — Progress Notes (Signed)
 Occupational Therapy Session Note  Patient Details  Name: ADRIELLE POLAKOWSKI MRN: [id number] Date of Birth: 06-20-1931  Today's Date: 09/20/2024 OT Individual Time: 8997-8884 OT Individual Time Calculation (min): 73 min    Short Term Goals: Week 1:  OT Short Term Goal 1 (Week 1): Pt will complete 3/3 toileting steps with Mod A OT Short Term Goal 2 (Week 1): Pt will complete UB dressing with Max A OT Short Term Goal 3 (Week 1): Pt will complete UB bathing with Max A OT Short Term Goal 4 (Week 1): Pt will LB dressing with Mod A  Skilled Therapeutic Interventions/Progress Updates:  Skilled OT session completed to address LUE ROM and toileting. Pt received seated in WC, agreeable to participate in therapy. Pt reports no pain, on arrival. During activity, pt reports pain in LUE, OT provided pain intervention with rest breaks and repositioning.   OT removed LUE sling. Per PA, pt can begin gentle Pendulum exs this date. OT provided demo of pendulum exs seated, pt experiencing increased difficulty and pain with this method as she is performing AROM during task, VC provided to discontinue d/t safety. Pt begins to experience nausea, OT provides fluids and rest breaks, pt reports nausea subsides. OT demos pendulum exs in standing, pt return demos at PFRW with VC throughout for proper technique, seated rest breaks required d/t fatigue. OT provided pendulum exs HEP in pt binder for pt to continue to review. Pt can perform flexion/extension, and horizontal abduction swings, continue f/u with circular swings.   Pt ambulates to toilet CGA with PFRW, toileting hygiene completed with Mod A to manage clothing on L. Pt completes peri care in standing. Ambulates back to bed CGA. EOB>Supine Mod A to place BLE in bed d/t fatigue. Pt supine in bed with son present, bed alarm on and all needs within reach.  Therapy Documentation Precautions:  Precautions Precautions: Fall Recall of Precautions/Restrictions:  Impaired Required Braces or Orthoses: Sling, Splint/Cast Other Brace: L bledsoe brace, RLE CAM boot, R wrist splint, LUE sling Restrictions Weight Bearing Restrictions Per Provider Order: Yes RUE Weight Bearing Per Provider Order: Non weight bearing LUE Weight Bearing Per Provider Order: Non weight bearing RLE Weight Bearing Per Provider Order: Weight bearing as tolerated LLE Weight Bearing Per Provider Order: Weight bearing as tolerated  Therapy/Group: Individual Therapy  Kriston Mckinnie Woods-Chance, MS, OTR/L 09/20/2024, 7:59 AM
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
## 2024-09-21 ENCOUNTER — Encounter (HOSPITAL_COMMUNITY): Payer: Self-pay | Admitting: Physical Medicine and Rehabilitation

## 2024-09-21 DIAGNOSIS — K5901 Slow transit constipation: Secondary | ICD-10-CM | POA: Diagnosis not present

## 2024-09-21 DIAGNOSIS — I10 Essential (primary) hypertension: Secondary | ICD-10-CM | POA: Diagnosis not present

## 2024-09-21 DIAGNOSIS — T1490XA Injury, unspecified, initial encounter: Secondary | ICD-10-CM | POA: Diagnosis not present

## 2024-09-21 NOTE — Progress Notes (Signed)
 Occupational Therapy Session Note  Patient Details  Name: TIMARIE LABELL MRN: [id number] Date of Birth: October 12, 1931  Today's Date: 09/21/2024 OT Individual Time: 1002-1050 OT Individual Time Calculation (min): 48 min    Short Term Goals: Week 2:  OT Short Term Goal 1 (Week 2): Pt will complete 3/3 pendulum exs with Min A OT Short Term Goal 2 (Week 2): Pt will complete LB dressing Min A OT Short Term Goal 3 (Week 2): Pt will complete 3/3 steps for toileting Min A OT Short Term Goal 4 (Week 2): Pt will complete UB dressing Mod A OT Short Term Goal 5 (Week 2): Pt will complete LB dressing Min A  Skilled Therapeutic Interventions/Progress Updates:  Skilled OT session completed to address LUE ROM and toileting. Pt received seated in WC, agreeable to participate in therapy. Pt reports no pain.  Pt dependently transferred in [facility] to main gym. Standing at PFRW, pt completes pendulum exs with OT provided guidances at hips to perform task. Pt requiring several seated rest breaks during activity, reporting increased fatigue this date. Pt displaying improvement with circular pendulum exs. Pt requests to void, task discontinued. Pt dependently propelled back to room, Min A STS at PFRW, CGA ambulating to bathroom. Pt completes toileting with supervision, increased time and encouragement to manage clothing during task; however, no physical A provided. Pt ambulates EOB CGA, EOB>Supine Min A to lift BLE. OT removed LUE sling, shoulder and elbow in neutral position, no pain reported. Session completed, with bed alarm on and all needs met.  Therapy Documentation Precautions:  Precautions Precautions: Fall Recall of Precautions/Restrictions: Impaired Required Braces or Orthoses: Sling, Splint/Cast Other Brace: L bledsoe brace, RLE CAM boot, R wrist splint, LUE sling Restrictions Weight Bearing Restrictions Per Provider Order: Yes RUE Weight Bearing Per Provider Order: Non weight bearing LUE Weight Bearing Per  Provider Order: Non weight bearing RLE Weight Bearing Per Provider Order: Weight bearing as tolerated LLE Weight Bearing Per Provider Order: Weight bearing as tolerated  Anyeli Hockenbury Woods-Chance, MS, OTR/L 09/21/2024, 5:08 PM

## 2024-09-21 NOTE — Progress Notes (Signed)
 Occupational Therapy Session Note  Patient Details  Name: Marie Alvarez MRN: [id number] Date of Birth: 1931-08-25  Today's Date: 09/21/2024 OT Individual Time: 0215-0340 OT Individual Time Calculation (min): 85 min    Short Term Goals: Week 1:  OT Short Term Goal 1 (Week 1): Pt will complete 3/3 toileting steps with Mod A OT Short Term Goal 1 - Progress (Week 1): Met OT Short Term Goal 2 (Week 1): Pt will complete UB dressing with Max A OT Short Term Goal 2 - Progress (Week 1): Met OT Short Term Goal 3 (Week 1): Pt will complete UB bathing with Max A OT Short Term Goal 3 - Progress (Week 1): Met OT Short Term Goal 4 (Week 1): Pt will LB dressing with Mod A OT Short Term Goal 4 - Progress (Week 1): Met  Skilled Therapeutic Interventions/Progress Updates:    Patient seated in the w/c with family present at the time of treatment. The pt reports a pain response of 5 on 0-10 scale for L shld pain. The pt  indicated that slept fairly well on during night. The pt was able to come from sit to stand with ModA to [facility] incorporating the platform walker for the execution of pendulum exercises 3x.  The pt went on to thread BLE through theraband 3x at  Mod to MaxA for  bring the theraband onto her hips and bottom.  The pt was ModA for donning her LUE sling with assistance for managing the straps. The pt went on to complete clothes pin exercise by removing and placing clothes pin onto a rod 4x.   At the end of the session, the pt remained at w/c LOF with her sling in place and all additional needs addressed.  The pt was able also to verbalize all precautionary measures Therapy Documentation Precautions:  Precautions Precautions: Fall Recall of Precautions/Restrictions: Impaired Required Braces or Orthoses: Sling, Splint/Cast Other Brace: L bledsoe brace, RLE CAM boot, R wrist splint, LUE sling Restrictions Weight Bearing Restrictions Per Provider Order: Yes RUE Weight Bearing Per Provider Order: Non  weight bearing LUE Weight Bearing Per Provider Order: Non weight bearing RLE Weight Bearing Per Provider Order: Weight bearing as tolerated LLE Weight Bearing Per Provider Order: Weight bearing as tolerated Therapy/Group: Individual Therapy  [name] 09/21/2024, 5:11 PM

## 2024-09-21 NOTE — Plan of Care (Signed)
  Problem: Consults Goal: RH GENERAL PATIENT EDUCATION Description: See Patient Education module for education specifics. Outcome: Progressing   Problem: RH BOWEL ELIMINATION Goal: RH STG MANAGE BOWEL WITH ASSISTANCE Description: STG Manage Bowel with supervision Assistance. Outcome: Progressing   Problem: RH BLADDER ELIMINATION Goal: RH STG MANAGE BLADDER WITH ASSISTANCE Description: STG Manage Bladder With supervision Assistance Outcome: Progressing   Problem: RH SKIN INTEGRITY Goal: RH STG SKIN FREE OF INFECTION/BREAKDOWN Description: Manage skin free of infection/breakdown with supervision assistance Outcome: Progressing   Problem: RH SAFETY Goal: RH STG ADHERE TO SAFETY PRECAUTIONS W/ASSISTANCE/DEVICE Description: STG Adhere to Safety Precautions With supervision Assistance/Device. Outcome: Progressing   Problem: RH PAIN MANAGEMENT Goal: RH STG PAIN MANAGED AT OR BELOW PT'S PAIN GOAL Description: <4 w/ prns Outcome: Progressing   Problem: RH KNOWLEDGE DEFICIT GENERAL Goal: RH STG INCREASE KNOWLEDGE OF SELF CARE AFTER HOSPITALIZATION Description: Manage increase knowledge of swelf care after hospitalization with supervision assistance from son using educational materials provided Outcome: Progressing
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
## 2024-09-22 DIAGNOSIS — I10 Essential (primary) hypertension: Secondary | ICD-10-CM | POA: Diagnosis not present

## 2024-09-22 DIAGNOSIS — K5901 Slow transit constipation: Secondary | ICD-10-CM | POA: Diagnosis not present

## 2024-09-22 DIAGNOSIS — T1490XA Injury, unspecified, initial encounter: Secondary | ICD-10-CM | POA: Diagnosis not present

## 2024-09-22 NOTE — Plan of Care (Signed)
  Problem: Consults Goal: RH GENERAL PATIENT EDUCATION Description: See Patient Education module for education specifics. Outcome: Progressing   Problem: RH BOWEL ELIMINATION Goal: RH STG MANAGE BOWEL WITH ASSISTANCE Description: STG Manage Bowel with supervision Assistance. Outcome: Progressing   Problem: RH BLADDER ELIMINATION Goal: RH STG MANAGE BLADDER WITH ASSISTANCE Description: STG Manage Bladder With supervision Assistance Outcome: Progressing   Problem: RH SKIN INTEGRITY Goal: RH STG SKIN FREE OF INFECTION/BREAKDOWN Description: Manage skin free of infection/breakdown with supervision assistance Outcome: Progressing   Problem: RH SAFETY Goal: RH STG ADHERE TO SAFETY PRECAUTIONS W/ASSISTANCE/DEVICE Description: STG Adhere to Safety Precautions With supervision Assistance/Device. Outcome: Progressing   Problem: RH PAIN MANAGEMENT Goal: RH STG PAIN MANAGED AT OR BELOW PT'S PAIN GOAL Description: <4 w/ prns Outcome: Progressing   Problem: RH KNOWLEDGE DEFICIT GENERAL Goal: RH STG INCREASE KNOWLEDGE OF SELF CARE AFTER HOSPITALIZATION Description: Manage increase knowledge of swelf care after hospitalization with supervision assistance from son using educational materials provided Outcome: Progressing

## 2024-09-22 NOTE — Progress Notes (Signed)
 Occupational Therapy Session Note  Patient Details  Name: TAMIKA SHROPSHIRE MRN: [id number] Date of Birth: 01-Feb-1931  Today's Date: 09/22/2024 OT Individual Time: 9084-8984 OT Individual Time Calculation (min): 60 min    Short Term Goals: Week 1:  OT Short Term Goal 1 (Week 1): Pt will complete 3/3 toileting steps with Mod A OT Short Term Goal 1 - Progress (Week 1): Met OT Short Term Goal 2 (Week 1): Pt will complete UB dressing with Max A OT Short Term Goal 2 - Progress (Week 1): Met OT Short Term Goal 3 (Week 1): Pt will complete UB bathing with Max A OT Short Term Goal 3 - Progress (Week 1): Met OT Short Term Goal 4 (Week 1): Pt will LB dressing with Mod A OT Short Term Goal 4 - Progress (Week 1): Met  Skilled Therapeutic Interventions/Progress Updates:    Initiation Of OT Services:  The pt was seated up in bed eating breakfast at the time of arrival.  The pt indicated that she rested well during the night with a pain response of 4-5 for the L arm.  The pt went on to say that nursing provided a pain patch and medication to address her pain. The pt in agreement with completing BADL related task a sink LOF.  Transfers:  The pt was able to come from supine in bed to EOB with close S with the head of the bed elevated.  The pt LUE sling was donned at [facility]. The pt was able to come from sit to stand with MinA using the platform walker for placement at w/c LOF.   Dressing Abner Exercise: The pt was transported to the sink and was able to doff her over head shirt with ModA. The pt was able to bathe her UB with s/uA, she was s/uA  for applying deo and Dep for removing the cap. The pt was able to donn her over head shirt with ModA, she was able to bathe her LB with MinA.  The pt was Dep for donn her brief and  she was  MinA for threading her legs into her pant. The pt was able to come from sit to stand with MinA and she required MinA for donning the pants over her hips and bottom. The pt returned to  sit and was transported to the sink for brushing her teeth and her hair at s/uA. At the end off the session the pt was turned over to nursing care.   Therapy Documentation Precautions:  Precautions Precautions: Fall Recall of Precautions/Restrictions: Impaired Required Braces or Orthoses: Sling, Splint/Cast Other Brace: L bledsoe brace, RLE CAM boot, R wrist splint, LUE sling Restrictions Weight Bearing Restrictions Per Provider Order: No RUE Weight Bearing Per Provider Order: Non weight bearing LUE Weight Bearing Per Provider Order: Non weight bearing RLE Weight Bearing Per Provider Order: Weight bearing as tolerated LLE Weight Bearing Per Provider Order: Weight bearing as tolerated  Therapy/Group: Individual Therapy  [name] 09/22/2024, 10:17 AM
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
## 2024-09-22 NOTE — Progress Notes (Signed)
 Physical Therapy Session Note  Patient Details  Name: Marie Alvarez MRN: [id number] Date of Birth: [birth year]  Today's Date: 09/22/2024 PT Individual Time: 1115-1155 + 1345-1440 PT Individual Time Calculation (min): 40 min + 55 min  Short Term Goals: Week 2:  PT Short Term Goal 1 (Week 2): pt will perform bed mobility with min A consistantly PT Short Term Goal 2 (Week 2): pt will transfer sit<>stand with LRAD and mod A consistantly PT Short Term Goal 3 (Week 2): pt will ambulate 132ft with LRAD and CGA  Skilled Therapeutic Interventions/Progress Updates:  Session 1: Chart reviewed and pt agreeable to therapy. Pt received seated in WC with 4/10 c/o pain in L UE. Also of note, pt c/o high nausea, so therapy session stayed close to pt room. Session focused on functional transfers, balance, and ambulation to promote safe home mobility and access. Pt initiated session with 3x sit to stand from [facility] using modA fading to minA + PFRW + VC for sequencing. Pt then taken to hallway. Pt completed 3 rounds of amb of 60-62ft using CGA + PFRW + support to push L side of PFRW in setting of WB restrictions. Pt noted to require light VC for anterior scooting and sequencing for sit to stand. Pt then c/o continued fatigue and nausea. Pt returned to bed using minA for LE management. At end of session, pt was left semi-reclined in bed with alarm engaged, nurse call bell and all needs in reach.  Session 2: Chart reviewed and pt agreeable to therapy. Pt received semi-reclined in bed with 4/10 c/o pain in L arm. Session focused on bed mobility, functional transfers, and ambulation to promote safe home mobility and access. Pt initiated session with transfer to EOB using CGA. Pt then completed sit to stand using minA and amb to/from toilet using CGA + PFRW. Pt noted to be able to manage PFRW without hands on assistance of PT and to be compliant with NWB restriction in LUE. Pt required maxA for pericare. Pt then completed block  practice of amb for 100-232ft using CGA + PFRW. Pt noted to required modA for sit to stand that faded to minA with further VC for sequencing. Pt required minA for BLE management to return to bed and modA for bed mobility for positioning. Session education emphasized safe standing technique increase independence with sit to stand transfer. At end of session, pt was left semi-reclined in bed with alarm engaged, nurse call bell and all needs in reach.       Therapy Documentation Precautions:  Precautions Precautions: Fall Recall of Precautions/Restrictions: Impaired Required Braces or Orthoses: Sling, Splint/Cast Other Brace: L bledsoe brace, RLE CAM boot, R wrist splint, LUE sling Restrictions Weight Bearing Restrictions Per Provider Order: No RUE Weight Bearing Per Provider Order: Non weight bearing LUE Weight Bearing Per Provider Order: Non weight bearing RLE Weight Bearing Per Provider Order: Weight bearing as tolerated LLE Weight Bearing Per Provider Order: Weight bearing as tolerated General:      Therapy/Group: Individual Therapy  Warrick KANDICE Raspberry 09/22/2024, 11:59 AM

## 2024-09-23 DIAGNOSIS — T1490XA Injury, unspecified, initial encounter: Secondary | ICD-10-CM | POA: Diagnosis not present

## 2024-09-23 LAB — BASIC METABOLIC PANEL WITH GFR
Anion gap: 9 (ref 5–15)
BUN: 30 mg/dL — ABNORMAL HIGH (ref 8–23)
CO2: 23 mmol/L (ref 22–32)
Calcium: 9.3 mg/dL (ref 8.9–10.3)
Chloride: 103 mmol/L (ref 98–111)
Creatinine, Ser: 0.93 mg/dL (ref 0.44–1.00)
GFR, Estimated: 58 mL/min — ABNORMAL LOW (ref >60)
Glucose, Bld: 104 mg/dL — ABNORMAL HIGH (ref 70–99)
Potassium: 4.8 mmol/L (ref 3.5–5.1)
Sodium: 135 mmol/L (ref 135–145)

## 2024-09-23 LAB — CBC
HCT: 35.6 % — ABNORMAL LOW (ref 36.0–46.0)
Hemoglobin: 11.9 g/dL — ABNORMAL LOW (ref 12.0–15.0)
MCH: 32.4 pg (ref 26.0–34.0)
MCHC: 33.4 g/dL (ref 30.0–36.0)
MCV: 97 fL (ref 80.0–100.0)
Platelets: 257 K/uL (ref 150–400)
RBC: 3.67 MIL/uL — ABNORMAL LOW (ref 3.87–5.11)
RDW: 14.7 % (ref 11.5–15.5)
WBC: 6.9 K/uL (ref 4.0–10.5)
nRBC: 0 % (ref 0.0–0.2)

## 2024-09-23 MED ORDER — BETHANECHOL CHLORIDE 10 MG PO TABS
5.0000 mg | ORAL_TABLET | Freq: Every day | ORAL | Status: DC
  Administered 2024-09-24: 5 mg via ORAL
  Filled 2024-09-23: qty 1

## 2024-09-23 MED ORDER — POLYETHYLENE GLYCOL 3350 17 G PO PACK
17.0000 g | PACK | Freq: Every day | ORAL | Status: DC
  Filled 2024-09-23: qty 1

## 2024-09-23 MED ORDER — L-METHYLFOLATE-B6-B12 3-35-2 MG PO TABS
1.0000 | ORAL_TABLET | Freq: Every day | ORAL | Status: DC
  Administered 2024-09-23 – 2024-09-26 (×4): 1 via ORAL
  Filled 2024-09-23 (×4): qty 1

## 2024-09-23 NOTE — Progress Notes (Signed)
 Physical Therapy Session Note  Patient Details  Name: Marie Alvarez MRN: [id number] Date of Birth: 30-Oct-1931  Today's Date: 09/23/2024 PT Individual Time: 1002-1114 PT Individual Time Calculation (min): 72 min   Short Term Goals: Week 2:  PT Short Term Goal 1 (Week 2): pt will perform bed mobility with min A consistantly PT Short Term Goal 2 (Week 2): pt will transfer sit<>stand with LRAD and mod A consistantly PT Short Term Goal 3 (Week 2): pt will ambulate 168ft with LRAD and CGA  Skilled Therapeutic Interventions/Progress Updates:     Pt supine in bed upon arrival. Pt agreeable to therapy. Pt denies any pain.   Pt endorses nausea, fatigue and diarrhea this AM. Nurse aware, nurse present at start of session to administer medication.   Pt wearing R UE hand splint. PT donned L UE sling, L LE bledsoe and shoe, and R LE cam boot with total A. Pt performed sit to stand with RPFRW and min A from elevated bed. Pt performed short distance ambulatory transfer to toilet with R PFRW and CGA/min A, verbal cues provided for maintenance of R UE Wbing precuaitons with stand to sit. Pt continent of bladder and bowel. Pt performed pericare with total A. Pt donned/doffed pants with mod A for mangement of L side.   Pt endorses lightheadedness upon return ambulation to bed-reduced with seated rest break.   Pt ambulated 1x80, 1x35 feet with R PFRW and CGA/light min A for assistance with RW on L side 2/2 precautions.   Pt performed 1x5 sit to stand from elevated mat table with CGA/min A with  No AD.   Pt performed ambulatory transfer to car simualtor at height of [address] and CGA/min A, pt required mod A progressing to heavy min A  for management of B LE with seat recliner, verbal cues provided for sequencing/technique.   Pt supine in bed at end of session wth all needs within reach and bed alarm on.    Therapy Documentation Precautions:  Precautions Precautions: Fall Recall of  Precautions/Restrictions: Impaired Required Braces or Orthoses: Sling, Splint/Cast Other Brace: L bledsoe brace, RLE CAM boot, R wrist splint, LUE sling Restrictions Weight Bearing Restrictions Per Provider Order: No RUE Weight Bearing Per Provider Order: Non weight bearing LUE Weight Bearing Per Provider Order: Non weight bearing RLE Weight Bearing Per Provider Order: Weight bearing as tolerated LLE Weight Bearing Per Provider Order: Weight bearing as tolerated   Therapy/Group: Individual Therapy  [facility] [place], [place], DPT\ 09/23/2024, 10:04 AM

## 2024-09-23 NOTE — Progress Notes (Addendum)
 Occupational Therapy Session Note  Patient Details  Name: Marie Alvarez MRN: [id number] Date of Birth: 09-22-1931  Today's Date: 09/23/2024 OT Individual Time: 8696-8640 OT Individual Time Calculation (min): 56 min    Short Term Goals: Week 2:  OT Short Term Goal 1 (Week 2): Pt will complete 3/3 pendulum exs with Min A OT Short Term Goal 2 (Week 2): Pt will complete LB dressing Min A OT Short Term Goal 3 (Week 2): Pt will complete 3/3 steps for toileting Min A OT Short Term Goal 4 (Week 2): Pt will complete UB dressing Mod A OT Short Term Goal 5 (Week 2): Pt will complete LB dressing Min A  Skilled Therapeutic Interventions/Progress Updates:    Pt received resting in bed with LUE sling, Rue wrist splint, LLE knee immobilizer, and RLE CAM boot donned. Pt presenting to be in good spirits receptive to skilled OT session reporting 0/10 pain- OT offering intermittent rest breaks, repositioning, and therapeutic support to optimize participation in therapy session. Upon OT arrival, Pt reported nausea and informed nursing. Pt requested to go to the bathroom. Ambulated to bathroom utilizing PFRW with CGA for safety. Urine and BM void and informed nursing. Completed peri care with MAX A while standing utilizing RW d/t precautions with CGA provided for standing balance. Pt completed functional mobility back to room utilizing PFRW with CGA for safety. Pt completed oral care and hair brushing while seated in WC at the sink with SUP for safety. Transported Pt outside via WC d/t time management for increase mood. Engaged Pt in gentle pendulum exercise while standing utilizing PFRW for balance and CGA for safety when building momentum for PROM in forward/backward, side to side, clockwise, and counterclockwise motions- verbal cues and demonstrations provide for technique. Engaged Pt in PROM facilitated by OTS for digit flexion/extension, wrist flexion/extension, elbow flexion/extension, and slight shoulder flexion.  Pt transported back to room via WC d/t time management. Pt was left resting in WC with call bell in reach, seatbelt alarm on, and all needs met.    Therapy Documentation Precautions:  Precautions Precautions: Fall Recall of Precautions/Restrictions: Impaired Required Braces or Orthoses: Sling, Splint/Cast Other Brace: L bledsoe brace, RLE CAM boot, R wrist splint, LUE sling Restrictions Weight Bearing Restrictions Per Provider Order: No RUE Weight Bearing Per Provider Order: Non weight bearing LUE Weight Bearing Per Provider Order: Non weight bearing RLE Weight Bearing Per Provider Order: Weight bearing as tolerated LLE Weight Bearing Per Provider Order: Weight bearing as tolerated   Therapy/Group: Individual Therapy  Paulina Fleeta Dixie 09/23/2024, 2:13 PM

## 2024-09-23 NOTE — Plan of Care (Signed)
  Problem: RH SAFETY Goal: RH STG ADHERE TO SAFETY PRECAUTIONS W/ASSISTANCE/DEVICE Description: STG Adhere to Safety Precautions With supervision Assistance/Device. Outcome: Progressing   Problem: RH PAIN MANAGEMENT Goal: RH STG PAIN MANAGED AT OR BELOW PT'S PAIN GOAL Description: <4 w/ prns Outcome: Progressing

## 2024-09-23 NOTE — Plan of Care (Signed)
  Problem: Consults Goal: RH GENERAL PATIENT EDUCATION Description: See Patient Education module for education specifics. Outcome: Progressing   Problem: RH BOWEL ELIMINATION Goal: RH STG MANAGE BOWEL WITH ASSISTANCE Description: STG Manage Bowel with supervision Assistance. Outcome: Progressing   Problem: RH BLADDER ELIMINATION Goal: RH STG MANAGE BLADDER WITH ASSISTANCE Description: STG Manage Bladder With supervision Assistance Outcome: Progressing   Problem: RH SKIN INTEGRITY Goal: RH STG SKIN FREE OF INFECTION/BREAKDOWN Description: Manage skin free of infection/breakdown with supervision assistance Outcome: Progressing   Problem: RH SAFETY Goal: RH STG ADHERE TO SAFETY PRECAUTIONS W/ASSISTANCE/DEVICE Description: STG Adhere to Safety Precautions With supervision Assistance/Device. Outcome: Progressing   Problem: RH PAIN MANAGEMENT Goal: RH STG PAIN MANAGED AT OR BELOW PT'S PAIN GOAL Description: <4 w/ prns Outcome: Progressing   Problem: RH KNOWLEDGE DEFICIT GENERAL Goal: RH STG INCREASE KNOWLEDGE OF SELF CARE AFTER HOSPITALIZATION Description: Manage increase knowledge of swelf care after hospitalization with supervision assistance from son using educational materials provided Outcome: Progressing
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
## 2024-09-24 DIAGNOSIS — T1490XA Injury, unspecified, initial encounter: Secondary | ICD-10-CM | POA: Diagnosis not present

## 2024-09-24 MED ORDER — MAGNESIUM GLUCONATE 500 (27 MG) MG PO TABS
250.0000 mg | ORAL_TABLET | Freq: Every day | ORAL | Status: DC
  Administered 2024-09-24: 250 mg via ORAL
  Filled 2024-09-24: qty 1

## 2024-09-24 MED ORDER — ENSURE PLUS HIGH PROTEIN PO LIQD
237.0000 mL | ORAL | Status: DC
  Administered 2024-09-24 – 2024-09-25 (×2): 237 mL via ORAL
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
## 2024-09-24 NOTE — Progress Notes (Addendum)
 Patient ID: Marie Alvarez, female   DOB: 09-16-1931, 89 y.o.   MRN: [id number]  Placed order for 16x16 wheelchair, [facility], hospital bed and RW R platform. Awaiting approval for delivery.   Cancelled hospital bed order. Unable to receive platform for the right arm through [employer]. Family will purchase out of pocket.  OP PT/OT referral sent to [facility].

## 2024-09-24 NOTE — Progress Notes (Signed)
 Physical Therapy Session Note  Patient Details  Name: Marie Alvarez MRN: [id number] Date of Birth: 17-Oct-1931  Today's Date: 09/24/2024 PT Individual Time: 1100-1157 and 1430-1457 PT Individual Time Calculation (min): 57 min and 27 min  Short Term Goals: Week 1:  PT Short Term Goal 1 (Week 1): pt will perform bed mobility with min A consistantly PT Short Term Goal 1 - Progress (Week 1): Progressing toward goal PT Short Term Goal 2 (Week 1): pt will transfer sit<>stand with LRAD and mod A consistantly PT Short Term Goal 2 - Progress (Week 1): Progressing toward goal PT Short Term Goal 3 (Week 1): pt will ambulate 1ft with LRAD and min A of 1 PT Short Term Goal 3 - Progress (Week 1): Met Week 2:  PT Short Term Goal 1 (Week 2): pt will perform bed mobility with min A consistantly PT Short Term Goal 2 (Week 2): pt will transfer sit<>stand with LRAD and mod A consistantly PT Short Term Goal 3 (Week 2): pt will ambulate 167ft with LRAD and CGA  Skilled Therapeutic Interventions/Progress Updates:   Treatment Session 1 Received pt sitting in [facility] with son present for family education training. Pt agreeable to PT treatment and reported pain in LUE with mobility (unrated). Session with emphasis on discharge planning, functional mobility/transfers, generalized strengthening and endurance, dynamic standing balance/coordination, and ambulation.   Pt transported to/from room in [facility] dependently. In rehab apartment, discussed pt's bed at home. Pt's son assisted with sit<>stand transfer with R PFRW and min A (educated son on hand placement, management of equipment, and how to stabilize RW). Pt ambulated to bed and transferred into supine with increased time and min A for BLE management - discussed need to sit as high up on bed as possible prior to lying down. Pt able to transfer supine<>sitting L EOB from flat bed with supervision and ++ time. Pt declined getting hospital bed and will have assist from son as  needed at home. Pt transferred bed<>low sitting couch with R PFRW and close supervision. Pt able to stand from low sitting couch with R PFRW and close supervision with Stan stabilizing RW.   Pt ambulated >268ft with R PFRW and close supervision to dayroom. Pt performed seated BLE strengthening on Kinetron at 20 cm/sec for 1 minute x 4 trials with emphasis on glute/quad strength. Discussed OPPT upon discharge. Pt performed WC mobility >381ft using BLE and supervision back to room with emphasis on hamstring strength. Pt required multiple rest breaks and min A to overcome uphill slopes on floor. Concluded session with pt sitting in [facility] with all needs within reach - pt's son cleared to assist with transfers in room and safety plan updated.   Treatment Session 2 Received pt semi-reclined in bed with Stan at bedside. Pt agreeable to PT treatment and did not state pain level. Session with emphasis on functional mobility/transfers, generalized strengthening and endurance, dynamic standing balance/coordination, and ambulation. Stan assisted with donning LUE sling, R wrist splint, R CAM boot, and L bledsoe brace with supervision (1 cues when donning wrist splint). Pt transferred semi-reclined<>sitting R EOB with HOB elevated and supervision and donned L shoe with max A.   Pt stood with R PFRW and supervision (Stan stabilizing RW) and pt ambulated >156ft with R PFRW and supervision. Discussed purchasing platform attachment and looked at options on [employer]. Returned to room and concluded session with pt sitting in recliner with all needs within reach and Stan at bedside.   Therapy Documentation Precautions:  Precautions Precautions: Fall Recall of Precautions/Restrictions: Impaired Required Braces or Orthoses: Sling, Splint/Cast Other Brace: L bledsoe brace, RLE CAM boot, R wrist splint, LUE sling Restrictions Weight Bearing Restrictions Per Provider Order: No RUE Weight Bearing Per Provider Order: Non weight  bearing LUE Weight Bearing Per Provider Order: Non weight bearing RLE Weight Bearing Per Provider Order: Weight bearing as tolerated LLE Weight Bearing Per Provider Order: Weight bearing as tolerated  Therapy/Group: Individual Therapy Therisa HERO Zaunegger Therisa Stains PT, DPT 09/24/2024, 6:51 AM

## 2024-09-24 NOTE — Plan of Care (Signed)
  Problem: Consults Goal: RH GENERAL PATIENT EDUCATION Description: See Patient Education module for education specifics. Outcome: Progressing   Problem: RH BOWEL ELIMINATION Goal: RH STG MANAGE BOWEL WITH ASSISTANCE Description: STG Manage Bowel with supervision Assistance. Outcome: Progressing   Problem: RH BLADDER ELIMINATION Goal: RH STG MANAGE BLADDER WITH ASSISTANCE Description: STG Manage Bladder With supervision Assistance Outcome: Progressing   Problem: RH SKIN INTEGRITY Goal: RH STG SKIN FREE OF INFECTION/BREAKDOWN Description: Manage skin free of infection/breakdown with supervision assistance Outcome: Progressing   Problem: RH SAFETY Goal: RH STG ADHERE TO SAFETY PRECAUTIONS W/ASSISTANCE/DEVICE Description: STG Adhere to Safety Precautions With supervision Assistance/Device. Outcome: Progressing   Problem: RH PAIN MANAGEMENT Goal: RH STG PAIN MANAGED AT OR BELOW PT'S PAIN GOAL Description: <4 w/ prns Outcome: Progressing   Problem: RH KNOWLEDGE DEFICIT GENERAL Goal: RH STG INCREASE KNOWLEDGE OF SELF CARE AFTER HOSPITALIZATION Description: Manage increase knowledge of swelf care after hospitalization with supervision assistance from son using educational materials provided Outcome: Progressing

## 2024-09-24 NOTE — Progress Notes (Signed)
 Occupational Therapy Session Note  Patient Details  Name: Marie Alvarez MRN: [id number] Date of Birth: July 28, 1931  Today's Date: 09/24/2024 OT Individual Time: 9198-9081 OT Individual Time Calculation (min): 77 min    Short Term Goals: Week 2:  OT Short Term Goal 1 (Week 2): Pt will complete 3/3 pendulum exs with Min A OT Short Term Goal 2 (Week 2): Pt will complete LB dressing Min A OT Short Term Goal 3 (Week 2): Pt will complete 3/3 steps for toileting Min A OT Short Term Goal 4 (Week 2): Pt will complete UB dressing Mod A OT Short Term Goal 5 (Week 2): Pt will complete LB dressing Min A  Skilled Therapeutic Interventions/Progress Updates:  Skilled OT session completed to address family education and discharge planning. Pt received in bed with HOB elevated eating, with son present, agreeable to participate in therapy. Pt reports no pain.  OT provided educations on recommended DME, don/doffing of braces/slings, and importance of encouragement for pt to participate in activity to increase independence with activity. Pt increasing upset about feeling as if she should be doing more, OT provided encouragement and education on CLOF. Pt and son verbalized understanding. OT demo'd donning of CAM boot and bledsoe brace, and reported importance of cleansing skin weekly. OT emphasized only removing braces when pt is in bed and the importance of donning before all transfers. Son return demos with Ind, pt A with lifting BLE while donning. Supine>EOB from flat bed using no bed rails with supervision, VC provided to prevent pt from weight bearing at R wrist, pt continues task weight bearing at R elbow. OT demos donning LUE sling, Son return demos with VC. Pt requests to void, EOB>3n1 over toilet with supervision, completing with PFRW. OT provides education to son on donning/doffing PFRW. OT provided supervision with toileting, pt manages clothing and hygiene in standing. Pt returns seated in [facility] with nursing  staff A with oral care. All needs within reach. Therapy Documentation Precautions:  Precautions Precautions: Fall Recall of Precautions/Restrictions: Impaired Required Braces or Orthoses: Sling, Splint/Cast Other Brace: L bledsoe brace, RLE CAM boot, R wrist splint, LUE sling Restrictions Weight Bearing Restrictions Per Provider Order: No RUE Weight Bearing Per Provider Order: Non weight bearing LUE Weight Bearing Per Provider Order: Non weight bearing RLE Weight Bearing Per Provider Order: Weight bearing as tolerated LLE Weight Bearing Per Provider Order: Weight bearing as tolerated    Therapy/Group: Individual Therapy  Mara Favero Woods-Chance, MS, OTR/L 09/24/2024, 7:58 AM

## 2024-09-25 ENCOUNTER — Other Ambulatory Visit (HOSPITAL_COMMUNITY): Payer: Self-pay

## 2024-09-25 ENCOUNTER — Inpatient Hospital Stay (HOSPITAL_COMMUNITY)

## 2024-09-25 ENCOUNTER — Telehealth (HOSPITAL_COMMUNITY): Payer: Self-pay | Admitting: Pharmacy Technician

## 2024-09-25 DIAGNOSIS — T1490XA Injury, unspecified, initial encounter: Secondary | ICD-10-CM | POA: Diagnosis not present

## 2024-09-25 MED ORDER — FERROUS SULFATE 325 (65 FE) MG PO TABS
325.0000 mg | ORAL_TABLET | Freq: Every day | ORAL | 0 refills | Status: AC
  Filled 2024-09-25: qty 30, 30d supply, fill #0

## 2024-09-25 MED ORDER — MAGNESIUM GLUCONATE 500 (27 MG) MG PO TABS
500.0000 mg | ORAL_TABLET | Freq: Every day | ORAL | 0 refills | Status: AC
  Filled 2024-09-25: qty 30, 30d supply, fill #0

## 2024-09-25 MED ORDER — AMIODARONE HCL 200 MG PO TABS
200.0000 mg | ORAL_TABLET | Freq: Every day | ORAL | 0 refills | Status: DC
  Filled 2024-09-25: qty 30, 30d supply, fill #0

## 2024-09-25 MED ORDER — JOURNAVX 50 MG PO TABS: 50.0000 mg | ORAL_TABLET | Freq: Two times a day (BID) | ORAL | 0 refills | Status: DC

## 2024-09-25 MED ORDER — LIDOCAINE 5 % EX PTCH
2.0000 | MEDICATED_PATCH | CUTANEOUS | 0 refills | Status: DC
  Filled 2024-09-25: qty 60, 30d supply, fill #0

## 2024-09-25 MED ORDER — SENNOSIDES-DOCUSATE SODIUM 8.6-50 MG PO TABS
1.0000 | ORAL_TABLET | Freq: Every day | ORAL | 0 refills | Status: DC
  Filled 2024-09-25: qty 30, 30d supply, fill #0

## 2024-09-25 MED ORDER — L-METHYLFOLATE-B6-B12 3-35-2 MG PO TABS
1.0000 | ORAL_TABLET | Freq: Every day | ORAL | 0 refills | Status: DC
  Filled 2024-09-25: qty 60, 60d supply, fill #0

## 2024-09-25 MED ORDER — MAGNESIUM GLUCONATE 500 (27 MG) MG PO TABS
500.0000 mg | ORAL_TABLET | Freq: Every day | ORAL | Status: DC
  Administered 2024-09-25: 500 mg via ORAL
  Filled 2024-09-25: qty 1

## 2024-09-25 MED ORDER — JOURNAVX 50 MG PO TABS
50.0000 mg | ORAL_TABLET | Freq: Two times a day (BID) | ORAL | 0 refills | Status: DC
  Filled 2024-09-25: qty 28, 14d supply, fill #0

## 2024-09-25 MED ORDER — METOPROLOL TARTRATE 50 MG PO TABS
50.0000 mg | ORAL_TABLET | Freq: Two times a day (BID) | ORAL | 0 refills | Status: AC
  Filled 2024-09-25: qty 60, 30d supply, fill #0

## 2024-09-25 MED ORDER — NAPROXEN 250 MG PO TABS
250.0000 mg | ORAL_TABLET | Freq: Two times a day (BID) | ORAL | 0 refills | Status: DC
  Filled 2024-09-25: qty 14, 7d supply, fill #0

## 2024-09-25 MED ORDER — ENSURE PLUS HIGH PROTEIN PO LIQD: 237.0000 mL | ORAL | Status: AC

## 2024-09-25 MED ORDER — DICLOFENAC SODIUM 1 % EX GEL
2.0000 g | Freq: Four times a day (QID) | CUTANEOUS | 0 refills | Status: AC
  Filled 2024-09-25: qty 100, 13d supply, fill #0

## 2024-09-25 NOTE — Progress Notes (Signed)
 Physical Therapy Session Note  Patient Details  Name: Marie Alvarez MRN: [id number] Date of Birth: Oct 20, 1931  Today's Date: 09/25/2024 PT Individual Time: 0802-0826 and 8969-8874 PT Individual Time Calculation (min): 24 min and 55 min  Short Term Goals: Week 1:  PT Short Term Goal 1 (Week 1): pt will perform bed mobility with min A consistantly PT Short Term Goal 1 - Progress (Week 1): Progressing toward goal PT Short Term Goal 2 (Week 1): pt will transfer sit<>stand with LRAD and mod A consistantly PT Short Term Goal 2 - Progress (Week 1): Progressing toward goal PT Short Term Goal 3 (Week 1): pt will ambulate 17ft with LRAD and min A of 1 PT Short Term Goal 3 - Progress (Week 1): Met Week 2:  PT Short Term Goal 1 (Week 2): pt will perform bed mobility with min A consistantly PT Short Term Goal 2 (Week 2): pt will transfer sit<>stand with LRAD and mod A consistantly PT Short Term Goal 3 (Week 2): pt will ambulate 127ft with LRAD and CGA  Skilled Therapeutic Interventions/Progress Updates:   Treatment Session 1 Received pt semi-reclined in bed reporting urge to toilet. Pt agreeable to PT treatment and reported pain 4/10 in L shoulder (premedicated). Session with emphasis on functional mobility/transfers, generalized strengthening and endurance, dynamic standing balance/coordination, ambulation, and toileting. Donned R CAM boot, R wrist splint, LUE sling, and LLE bledsoe brace with total A and pt transferred semi-reclined<>sitting R EOB with HOB elevated and use of bedrails with supervision and ++ time.  Pt performed all transfers with R PFRW and supervision throughout session (assist to stabilize RW). Pt ambulated in/out of bathroom with R PFRW and supervision/CGA and required mod A for clothing management on L side. Pt able to void and performed hygiene management standing with supervision. Concluded session with pt sitting in recliner with all needs within reach. Pillows placed under BUE and  behind neck for comfort and provided pt with ginger ale.   Treatment Session 2 Received pt sitting in recliner with Stan at bedside. Pt agreeable to PT treatment and reported pain 4/10 in LUE (premedicated). Session with emphasis on discharge planning, functional mobility/transfers, generalized strengthening and endurance, dynamic standing balance/coordination, ambulation, and simulated car transfers. Went through sensation, MMT, and pain interference questionnaire in preparation for discharge.  [name] reported he isn't sure if the platform attachment will arrive in time - educated [name] on how to instruct pt into standing with RW placing R elbow on RW handgrip - pt performed x 3 sit<>stands with RW with Stan providing CGA and cues for R elbow placement. Donned L shoe with max A and pt performed remainder of transfers with R PFRW and supervision throughout session (assist to stabilize RW). Pt ambulated 59ft with R PFRW and supervision to ortho gym. Pt performed ambulatory simulated car transfer with R PFRW and supervision with ++ time, then ambulated 35ft on uneven surfaces (ramp) with R PFRW and supervision. Pt able to stand and pick up object from floor with reacher and supervision. Pt then ambulated 24ft x 2 trials with R PFRW and supervision back to room. Concluded session with pt sitting in recliner with all needs within reach and Stan at bedside.   Therapy Documentation Precautions:  Precautions Precautions: Fall Recall of Precautions/Restrictions: Impaired Required Braces or Orthoses: Sling, Splint/Cast Other Brace: L bledsoe brace, RLE CAM boot, R wrist splint, LUE sling Restrictions Weight Bearing Restrictions Per Provider Order: No RUE Weight Bearing Per Provider Order: Weight bear through  elbow only LUE Weight Bearing Per Provider Order: Non weight bearing RLE Weight Bearing Per Provider Order: Weight bearing as tolerated LLE Weight Bearing Per Provider Order: Weight bearing as  tolerated  Therapy/Group: Individual Therapy Therisa HERO Zaunegger Therisa Stains PT, DPT 09/25/2024, 7:04 AM
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
## 2024-09-25 NOTE — Progress Notes (Signed)
 Occupational Therapy Session Note  Patient Details  Name: Marie Alvarez MRN: [id number] Date of Birth: June 16, 1931  Session 1 Today's Date: 09/25/2024 OT Individual Time: 9084-8986 OT Individual Time Calculation (min): 58 min  Session 2 Today's Date: 09/25/2024 OT Individual Time: 1400-1445 OT Individual Time Calculation (min): 45 min  Session 3 Today's Date: 09/25/2024 OT Individual Time: 8496-8480 OT Individual Time Calculation (min): 16 min   Short Term Goals: Week 2:  OT Short Term Goal 1 (Week 2): Pt will complete 3/3 pendulum exs with Min A OT Short Term Goal 2 (Week 2): Pt will complete LB dressing Min A OT Short Term Goal 3 (Week 2): Pt will complete 3/3 steps for toileting Min A OT Short Term Goal 4 (Week 2): Pt will complete UB dressing Mod A OT Short Term Goal 5 (Week 2): Pt will complete LB dressing Min A  Skilled Therapeutic Interventions/Progress Updates:  Session 1: Skilled OT session completed to address ADL retraining and family education. Pt received seated in recliner with son present, agreeable to participate in therapy. Pt reports no pain.  Pt requested to void, OT instructed pt and son on toilet transfer using PFRW, pt completes task with supervision. OT provided education to son on encouraging pt to complete tasks and allowing full level of independence with tasks. OT guided son with set-up A for ADLs currently at bedside, pt able to bathe seated in WC. Modifications to task required when cleansing skin with CAM boot and bledsoe brace, pt must be in bed at this time when completing. Son A by donning/doffing braces, Pt completes LB bathing with LH sponge with supervision, LB dressing completed with Mod A to weave in BLE and A with donning braces. Once braces are on pt able to bridge in bed and pull pants up over bottom. OT provided education on shifting task to EOB once braces are donned so pt can complete in standing. Pt and son verbalize understanding and will  collaborate on preference once in home environment. Seated EOB, pt completed UB bathing with supervision, Min A to don/doff shirt and LUE sling. Pt attempts to don sling with independence but reporting R shoulder pain with overhead reach, son A with donning UB clothing and sling. EOB>recliner with supervision ambulating with PFRW. Pt seated in recliner, with all needs in reach. Session 2: Skilled OT session completed to address pendulum exs and shower transfers. Pt received seated in recliner with son present, agreeable to participate in therapy. Pt reports no pain.  OT provided education on pendulum exs to son with pt and OT providing demo. Pt requires return demo and verbal cues when completing activity. OT directs son to pendulum HEP in pt binder for reference when completing with pt. Son verbalized understanding. Pt completed ambulation ~281ft+ to ADL apt, OT instructed son to A with transfer supporting at gait belt. Pt completes with supervision. OT demos TTB in bathroom, pt return demos with Min A to lift BLE over tub seated. Pt is currently not cleared to shower, but preps for home environment. Son and pt repeat task with VC for safety when exiting tub to strap on PFRW. Pt and son return to room, pt seated in recliner. Son providing good VC on return transfer ensuring pt is keeping a safe distance at PFRW. Once seated, pt is presenting with nausea and vomiting. Nursing notified, direct handoff with NT.   Session 3 Skilled OT session completed to address discharge planning and self-care. Pt received seated in recliner with  son present, agreeable to participate in therapy. Pt reports no pain and less nausea after receiving nausea medication.   Pt requests to shampoo hair with cap. OT provides shampoo cap and son A with VC to complete task. Son requests more information about A with hair washing at home. OT provides education on purchasing shampoo caps as well as using water basin/wash clothes with  shampoo and conditioner. Pt reports preference with shampoo cap. Son also demos pendulum exs to ensure correct cues are given to pt when completed, OT provides VC to correct positioning and also directs to videos on HEP to A with providing cues. Pt and Son report comfort with discharge home, session completed with all needs met.   Therapy Documentation Precautions:  Precautions Precautions: Fall Recall of Precautions/Restrictions: Impaired Required Braces or Orthoses: Sling, Splint/Cast, Other Brace Other Brace: L bledsoe brace, RLE CAM boot, R wrist splint, LUE sling Restrictions Weight Bearing Restrictions Per Provider Order: Yes RUE Weight Bearing Per Provider Order: Weight bear through elbow only LUE Weight Bearing Per Provider Order: Non weight bearing RLE Weight Bearing Per Provider Order: Weight bearing as tolerated LLE Weight Bearing Per Provider Order: Weight bearing as tolerated   Therapy/Group: Individual Therapy  Analiz Tvedt Woods-Chance, MS, OTR/L 09/25/2024, 7:55 AM

## 2024-09-25 NOTE — Progress Notes (Signed)
 Inpatient Rehabilitation Discharge Medication Review by a Pharmacist  A complete drug regimen review was completed for this patient to identify any potential clinically significant medication issues.  High Risk Drug Classes Is patient taking? Indication by Medication  Antipsychotic No   Anticoagulant No   Antibiotic No   Opioid No   Antiplatelet No   Hypoglycemics/insulin No   Vasoactive Medication Yes Amiodarone -  SVT/ wide-complex tachycardia Metoprolol  - HTN  Chemotherapy No   Other No Acetaminophen  - pain Vitamin B-12, vitamin D , preserve vision, ferrous sulfate   - supplements Lidocaine  patches - topical pain relief Allegra - allergies Nortriptyline  - insomnia Pantoprazole  - GERD Pravastatin  - hyperlipidemia Voltaren gel/naproxen - pain Journavx - pain Metanx -  neuropathy Estrace - post menopausal     Type of Medication Issue Identified Description of Issue Recommendation(s)  Drug Interaction(s) (clinically significant)     Duplicate Therapy     Allergy     No Medication Administration End Date     Incorrect Dose     Additional Drug Therapy Needed     Significant med changes from prior encounter (inform family/care partners about these prior to discharge).    Other       Clinically significant medication issues were identified that warrant physician communication and completion of prescribed/recommended actions by midnight of the next day:  No  Pharmacist comments:    Time spent performing this drug regimen review (minutes):  20  Sergio Batch, PharmD, [place], AAHIVP, CPP Infectious Disease Pharmacist 09/25/2024 11:12 AM

## 2024-09-25 NOTE — Plan of Care (Signed)
  Problem: Consults Goal: RH GENERAL PATIENT EDUCATION Description: See Patient Education module for education specifics. Outcome: Progressing   Problem: RH BOWEL ELIMINATION Goal: RH STG MANAGE BOWEL WITH ASSISTANCE Description: STG Manage Bowel with supervision Assistance. Outcome: Progressing   Problem: RH BLADDER ELIMINATION Goal: RH STG MANAGE BLADDER WITH ASSISTANCE Description: STG Manage Bladder With supervision Assistance Outcome: Progressing   Problem: RH SKIN INTEGRITY Goal: RH STG SKIN FREE OF INFECTION/BREAKDOWN Description: Manage skin free of infection/breakdown with supervision assistance Outcome: Progressing   Problem: RH SAFETY Goal: RH STG ADHERE TO SAFETY PRECAUTIONS W/ASSISTANCE/DEVICE Description: STG Adhere to Safety Precautions With supervision Assistance/Device. Outcome: Progressing   Problem: RH PAIN MANAGEMENT Goal: RH STG PAIN MANAGED AT OR BELOW PT'S PAIN GOAL Description: <4 w/ prns Outcome: Progressing   Problem: RH KNOWLEDGE DEFICIT GENERAL Goal: RH STG INCREASE KNOWLEDGE OF SELF CARE AFTER HOSPITALIZATION Description: Manage increase knowledge of swelf care after hospitalization with supervision assistance from son using educational materials provided Outcome: Progressing

## 2024-09-25 NOTE — Progress Notes (Addendum)
 Patient ID: Marie Alvarez, female   DOB: [birth year], 89 y.o.   MRN: [id number]  Have reviewed team conference with pt and family. Both aware and agreeable with targeted d/c date of 10/16 and goals of Supervision/Verbal cueing.  DME ordered: 16x16 wheelchair and BSC and should be delivered prior to discharge. Family purchased walker right arm platform. OP PT/OT referral faxed to [facility].    UPDATE:   1523: [employer] reports the delivery is in the final stages of being processed.

## 2024-09-25 NOTE — Telephone Encounter (Signed)
 Pharmacy Patient Advocate Encounter   Received notification from Inpatient Request that prior authorization for Journavx 50MG  tablets  is required/requested.   Insurance verification completed.   The patient is insured through [facility].   Per test claim: PA required; PA submitted to above mentioned insurance via Latent Key/confirmation #/EOC [id number] Status is pending

## 2024-09-25 NOTE — Progress Notes (Signed)
 Occupational Therapy Discharge Summary  Patient Details  Name: Marie Alvarez MRN: [id number] Date of Birth: 28-Dec-1930  Date of Discharge from OT service:September 25, 2024  Patient has met 10 of 11 long term goals due to improved activity tolerance, improved balance, postural control, ability to compensate for deficits, functional use of  RIGHT upper extremity, improved attention, improved awareness, and improved coordination.  Patient to discharge at [facility] Assist level.  Patient currently completes Bathing with supervision, UB dressing at Min A to don/doff LUE sling, Mod A to don/doff bledsoe brace and CAM boot, and stand pivot/ambulatory transfers with supervision using R PFRW. Per ortho, pt cleared to begin gentle pendulum exs on LUE, pt requires VC to complete. Son has received education on CLOF, ADL routine, and transfers.  Patient's care partner is independent to provide the necessary physical assistance at discharge.    Reasons goals not met: LB dressing goal not met d/t pt required to be in supine to don/doff lower body orthosis requiring A to don/doff in this position.   Recommendation:  Patient will benefit from ongoing skilled OT services in home health setting to continue to advance functional skills in the area of BADL and iADL.  Equipment: DABSC  Reasons for discharge: treatment goals met and discharge from hospital  Patient/family agrees with progress made and goals achieved: Yes  OT Discharge Precautions/Restrictions  Precautions Precautions: Fall Recall of Precautions/Restrictions: Intact Required Braces or Orthoses: Sling;Splint/Cast;Other Brace Knee Immobilizer - Left: On when out of bed or walking Other Brace: L bledsoe brace, RLE CAM boot, R wrist splint, LUE sling Restrictions Weight Bearing Restrictions Per Provider Order: Yes RUE Weight Bearing Per Provider Order: Weight bear through elbow only LUE Weight Bearing Per Provider Order: Non weight bearing RLE  Weight Bearing Per Provider Order: Weight bearing as tolerated LLE Weight Bearing Per Provider Order: Weight bearing as tolerated Other Position/Activity Restrictions: RLE CAM boot, LLE hinged knee brace, L sling, R wrist splint General   Vital Signs Therapy Vitals Temp: 97.7 F (36.5 C) Pulse Rate: 65 Resp: 18 BP: (!) 124/51 Patient Position (if appropriate): Sitting Oxygen Therapy SpO2: 97 % O2 Device: Room Air Pain Pain Assessment Pain Scale: 0-10 Pain Score: 2  Pain Location: Generalized Pain Intervention(s): Medication (See eMAR) ADL ADL Eating: Set up Where Assessed-Eating: Bed level Grooming: Minimal assistance Where Assessed-Grooming: Chair Upper Body Bathing: Supervision/safety Where Assessed-Upper Body Bathing: Edge of bed Lower Body Bathing: Supervision/safety Where Assessed-Lower Body Bathing: Bed level Upper Body Dressing: Minimal assistance Where Assessed-Upper Body Dressing: Edge of bed Lower Body Dressing: Moderate assistance Where Assessed-Lower Body Dressing: Bed level Toileting: Supervision/safety Where Assessed-Toileting: Toilet, Bedside Commode Toilet Transfer: Distant supervision Toilet Transfer Method: Proofreader: Gaffer: Close supervison Web designer Method: Ship broker: Sales promotion account executive Baseline Vision/History: 1 Wears glasses Patient Visual Report: Eye fatigue/eye pain/headache Vision Assessment?: Wears glasses for reading Perception  Perception: Within Functional Limits Praxis Praxis: WFL Cognition Cognition Overall Cognitive Status: Within Functional Limits for tasks assessed Arousal/Alertness: Awake/alert Memory: Appears intact Attention: Focused Focused Attention: Appears intact Awareness: Appears intact Problem Solving: Appears intact Safety/Judgment: Appears intact Brief Interview for Mental Status (BIMS) Repetition of Three Words  (First Attempt): 3 Temporal Orientation: Year: Correct Temporal Orientation: Month: Accurate within 5 days Temporal Orientation: Day: Correct Recall: Sock: Yes, no cue required Recall: Blue: Yes, no cue required Recall: Bed: Yes, no cue required BIMS Summary Score: 15 Sensation Sensation Light Touch: Appears Intact Hot/Cold: Not  tested Proprioception: Appears Intact Stereognosis: Not tested Coordination Gross Motor Movements are Fluid and Coordinated: No Fine Motor Movements are Fluid and Coordinated: No Coordination and Movement Description: pain, numerous WB precautions, and global weakness/deconditioning Finger Nose Finger Test: LUE in sling, NA Motor  Motor Motor: Abnormal postural alignment and control Motor - Skilled Clinical Observations: pain, numerous WB precautions, and global weakness/deconditioning Mobility  Bed Mobility Bed Mobility: Rolling Right;Rolling Left;Sit to Supine;Supine to Sit Rolling Right: Supervision/verbal cueing Rolling Left: Supervision/Verbal cueing Supine to Sit: Supervision/Verbal cueing Sit to Supine: Supervision/Verbal cueing Transfers Sit to Stand: Supervision/Verbal cueing Stand to Sit: Supervision/Verbal cueing  Trunk/Postural Assessment  Cervical Assessment Cervical Assessment: Exceptions to [facility] (forward head) Thoracic Assessment Thoracic Assessment: Exceptions to [facility] (kyphosis) Lumbar Assessment Lumbar Assessment: Exceptions to [facility] (posterior pelvic titl) Postural Control Postural Control: Deficits on evaluation Righting Reactions: delayed due to WB precautions Protective Responses: delayed due to WB precautions  Balance Balance Balance Assessed: Yes Static Sitting Balance Static Sitting - Balance Support: Feet supported;No upper extremity supported Static Sitting - Level of Assistance: 6: Modified independent (Device/Increase time) Dynamic Sitting Balance Dynamic Sitting - Balance Support: No upper extremity  supported;Feet supported Dynamic Sitting - Level of Assistance: 5: Stand by assistance Static Standing Balance Static Standing - Balance Support: Right upper extremity supported;During functional activity Static Standing - Level of Assistance: 5: Stand by assistance Dynamic Standing Balance Dynamic Standing - Balance Support: Right upper extremity supported;During functional activity Dynamic Standing - Level of Assistance: 5: Stand by assistance Extremity/Trunk Assessment RUE Assessment RUE Assessment: Within Functional Limits Passive Range of Motion (PROM) Comments: WFL Active Range of Motion (AROM) Comments: WFL General Strength Comments: Pt NWB, strength not assessed LUE Assessment LUE Assessment: Not tested General Strength Comments: NWB, immobilized in sling  Christorpher Hisaw Woods-Chance, MS, OTR/L 09/25/2024, 5:52 PM

## 2024-09-25 NOTE — Plan of Care (Signed)
  Problem: RH Balance Goal: LTG: Patient will maintain dynamic sitting balance (OT) Description: LTG:  Patient will maintain dynamic sitting balance with assistance during activities of daily living (OT) Outcome: Completed/Met   Problem: Sit to Stand Goal: LTG:  Patient will perform sit to stand in prep for activites of daily living with assistance level (OT) Description: LTG:  Patient will perform sit to stand in prep for activites of daily living with assistance level (OT) Outcome: Completed/Met   Problem: RH Eating Goal: LTG Patient will perform eating w/assist, cues/equip (OT) Description: LTG: Patient will perform eating with assist, with/without cues using equipment (OT) Outcome: Completed/Met   Problem: RH Eating Goal: LTG Patient will perform eating w/assist, cues/equip (OT) Description: LTG: Patient will perform eating with assist, with/without cues using equipment (OT) Outcome: Completed/Met   Problem: RH Grooming Goal: LTG Patient will perform grooming w/assist,cues/equip (OT) Description: LTG: Patient will perform grooming with assist, with/without cues using equipment (OT) Outcome: Completed/Met   Problem: RH Bathing Goal: LTG Patient will bathe all body parts with assist levels (OT) Description: LTG: Patient will bathe all body parts with assist levels (OT) Outcome: Completed/Met   Problem: RH Dressing Goal: LTG Patient will perform upper body dressing (OT) Description: LTG Patient will perform upper body dressing with assist, with/without cues (OT). Outcome: Completed/Met Goal: LTG Patient will perform lower body dressing w/assist (OT) Description: LTG: Patient will perform lower body dressing with assist, with/without cues in positioning using equipment (OT) Outcome: Adequate for Discharge   Problem: RH Functional Use of Upper Extremity Goal: LTG Patient will use RT/LT upper extremity as a (OT) Description: LTG: Patient will use right/left upper extremity as a  stabilizer/gross assist/diminished/nondominant/dominant level with assist, with/without cues during functional activity (OT) Outcome: Completed/Met   Problem: RH Simple Meal Prep Goal: LTG Patient will perform simple meal prep w/assist (OT) Description: LTG: Patient will perform simple meal prep with assistance, with/without cues (OT). Outcome: Completed/Met   Problem: RH Awareness Goal: LTG: Patient will demonstrate awareness during functional activites type of (OT) Description: LTG: Patient will demonstrate awareness during functional activites type of (OT) Outcome: Completed/Met

## 2024-09-25 NOTE — Progress Notes (Signed)
 Physical Therapy Discharge Summary  Patient Details  Name: Marie Alvarez MRN: [id number] Date of Birth: 05/27/1931  Date of Discharge from PT service:September 25, 2024  Patient has met 8 of 8 long term goals due to improved activity tolerance, improved balance, improved postural control, increased strength, increased range of motion, decreased pain, ability to compensate for deficits, improved awareness, and improved coordination.  Patient to discharge at a wheelchair level Supervision. Patient's care partner is independent to provide the necessary physical assistance at discharge. Pt's son, [name], has been present for multiple family education sessions.   All goals met  Recommendation:  Patient will benefit from ongoing skilled PT services in outpatient setting to continue to advance safe functional mobility, address ongoing impairments in transfers, generalized strengthening and endurance, dynamic standing balance/coordination, ambulation, and to minimize fall risk.  Equipment: 16x16 manual WC, R platform attachment   Reasons for discharge: treatment goals met  Patient/family agrees with progress made and goals achieved: Yes  PT Discharge Precautions/Restrictions Precautions Precautions: Fall Required Braces or Orthoses: Sling;Splint/Cast;Other Brace Other Brace: L bledsoe brace, RLE CAM boot, R wrist splint, LUE sling Restrictions Weight Bearing Restrictions Per Provider Order: Yes RUE Weight Bearing Per Provider Order: Weight bear through elbow only LUE Weight Bearing Per Provider Order: Non weight bearing RLE Weight Bearing Per Provider Order: Weight bearing as tolerated LLE Weight Bearing Per Provider Order: Weight bearing as tolerated Pain Interference Pain Interference Pain Effect on Sleep: 1. Rarely or not at all Pain Interference with Therapy Activities: 1. Rarely or not at all Pain Interference with Day-to-Day Activities: 1. Rarely or not at all Cognition Overall  Cognitive Status: Within Functional Limits for tasks assessed Arousal/Alertness: Awake/alert Orientation Level: Oriented X4 Memory: Appears intact Awareness: Appears intact Problem Solving: Appears intact Safety/Judgment: Appears intact Sensation Sensation Light Touch: Appears Intact Hot/Cold: Not tested Proprioception: Appears Intact Stereognosis: Not tested Coordination Gross Motor Movements are Fluid and Coordinated: No Fine Motor Movements are Fluid and Coordinated: No Coordination and Movement Description: pain, numerous WB precautions, and global weakness/deconditioning Finger Nose Finger Test: LUE in sling, NA Motor  Motor Motor: Abnormal postural alignment and control Motor - Skilled Clinical Observations: pain, numerous WB precautions, and global weakness/deconditioning  Mobility Bed Mobility Bed Mobility: Rolling Right;Rolling Left;Sit to Supine;Supine to Sit Rolling Right: Supervision/verbal cueing Rolling Left: Supervision/Verbal cueing Supine to Sit: Supervision/Verbal cueing Sit to Supine: Supervision/Verbal cueing Transfers Transfers: Sit to Stand;Stand to Sit;Stand Pivot Transfers Sit to Stand: Supervision/Verbal cueing Stand to Sit: Supervision/Verbal cueing Stand Pivot Transfers: Supervision/Verbal cueing Transfer (Assistive device): Right platform walker Locomotion  Gait Ambulation: Yes Gait Assistance: Supervision/Verbal cueing Gait Distance (Feet): 250 Feet Assistive device: Right platform walker Gait Assistance Details: Verbal cues for gait pattern Gait Assistance Details: verbal cues for upright posture/gaze Gait Gait: Yes Gait Pattern: Impaired Gait Pattern: Step-through pattern;Decreased step length - right;Decreased step length - left;Decreased stride length;Trunk flexed;Poor foot clearance - left;Poor foot clearance - right;Narrow base of support Gait velocity: decreased Stairs / Additional Locomotion Ramp: Supervision/Verbal cueing (R  PFRW) Wheelchair Mobility Wheelchair Mobility: Yes Wheelchair Assistance: Doctor, general practice: Both lower extermities Wheelchair Parts Management: Needs assistance Distance: 334ft  Trunk/Postural Assessment  Cervical Assessment Cervical Assessment: Exceptions to [facility] (forward head) Thoracic Assessment Thoracic Assessment: Exceptions to [facility] (kyphosis) Lumbar Assessment Lumbar Assessment: Exceptions to [facility] (posterior pelvic tilt) Postural Control Postural Control: Deficits on evaluation Righting Reactions: delayed due to WB precautions Protective Responses: delayed due to WB precautions  Balance Balance Balance Assessed: Yes  Static Sitting Balance Static Sitting - Balance Support: Feet supported;No upper extremity supported Static Sitting - Level of Assistance: 6: Modified independent (Device/Increase time) Dynamic Sitting Balance Dynamic Sitting - Balance Support: No upper extremity supported;Feet supported Dynamic Sitting - Level of Assistance: 5: Stand by assistance (supervision) Static Standing Balance Static Standing - Balance Support: Right upper extremity supported;During functional activity (R PFRW) Static Standing - Level of Assistance: 5: Stand by assistance (supervision) Dynamic Standing Balance Dynamic Standing - Balance Support: Right upper extremity supported;During functional activity (R PFRW) Dynamic Standing - Level of Assistance: 5: Stand by assistance (supervision) Dynamic Standing - Comments: with transfers and ambulation Extremity Assessment  RLE Assessment RLE Assessment: Exceptions to [facility] General Strength Comments: tested sitting in recliner - limited by R CAM boot RLE Strength Right Hip Flexion: 4-/5 Right Hip ABduction: 4-/5 Right Hip ADduction: 4-/5 Right Knee Flexion: 4-/5 Right Knee Extension: 3+/5 LLE Assessment LLE Assessment: Exceptions to [facility] General Strength Comments: tested sitting in recliner LLE Strength Left  Hip Flexion: 3+/5 Left Hip ABduction: 4-/5 Left Hip ADduction: 4-/5 Left Knee Flexion: 4-/5 Left Knee Extension: 3+/5 Left Ankle Dorsiflexion: 4-/5 Left Ankle Plantar Flexion: 4-/5   Therisa HERO Zaunegger Therisa Stains PT, DPT 09/25/2024, 7:22 AM
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
## 2024-09-25 NOTE — Telephone Encounter (Signed)
 Pharmacy Patient Advocate Encounter  Received notification from [facility] that Prior Authorization for Journavx 50MG  tablets   has been APPROVED from 09/25/2024 to 09/25/2025   PA #/Case ID/Reference #: [id number]

## 2024-09-26 ENCOUNTER — Other Ambulatory Visit (HOSPITAL_COMMUNITY): Payer: Self-pay

## 2024-09-26 DIAGNOSIS — T1490XA Injury, unspecified, initial encounter: Secondary | ICD-10-CM | POA: Diagnosis not present

## 2024-09-26 LAB — BASIC METABOLIC PANEL WITH GFR
Anion gap: 7 (ref 5–15)
BUN: 20 mg/dL (ref 8–23)
CO2: 25 mmol/L (ref 22–32)
Calcium: 9.5 mg/dL (ref 8.9–10.3)
Chloride: 105 mmol/L (ref 98–111)
Creatinine, Ser: 0.77 mg/dL (ref 0.44–1.00)
GFR, Estimated: >60 mL/min (ref >60)
Glucose, Bld: 95 mg/dL (ref 70–99)
Potassium: 4.4 mmol/L (ref 3.5–5.1)
Sodium: 137 mmol/L (ref 135–145)

## 2024-09-26 LAB — CBC
HCT: 37.1 % (ref 36.0–46.0)
Hemoglobin: 12.3 g/dL (ref 12.0–15.0)
MCH: 32.1 pg (ref 26.0–34.0)
MCHC: 33.2 g/dL (ref 30.0–36.0)
MCV: 96.9 fL (ref 80.0–100.0)
Platelets: 230 K/uL (ref 150–400)
RBC: 3.83 MIL/uL — ABNORMAL LOW (ref 3.87–5.11)
RDW: 14.6 % (ref 11.5–15.5)
WBC: 6.4 K/uL (ref 4.0–10.5)
nRBC: 0 % (ref 0.0–0.2)

## 2024-09-26 MED ORDER — TAMSULOSIN HCL 0.4 MG PO CAPS
0.4000 mg | ORAL_CAPSULE | Freq: Every day | ORAL | 0 refills | Status: AC
  Filled 2024-09-26: qty 30, 30d supply, fill #0

## 2024-09-26 MED ORDER — JOURNAVX 50 MG PO TABS: 50.0000 mg | ORAL_TABLET | Freq: Two times a day (BID) | ORAL | 0 refills | Status: DC

## 2024-09-26 MED ORDER — TAMSULOSIN HCL 0.4 MG PO CAPS: 0.4000 mg | ORAL_CAPSULE | Freq: Every day | ORAL | Status: DC
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
## 2024-09-26 NOTE — Progress Notes (Signed)
 Inpatient Rehabilitation Discharge Medication Review by a Pharmacist  A complete drug regimen review was completed for this patient to identify any potential clinically significant medication issues.  High Risk Drug Classes Is patient taking? Indication by Medication  Antipsychotic No   Anticoagulant No   Antibiotic No   Opioid No   Antiplatelet No   Hypoglycemics/insulin No   Vasoactive Medication Yes Amiodarone -  SVT/ wide-complex tachycardia Metoprolol  - HTN Flomax-urinary retention  Chemotherapy No   Other No Acetaminophen  - pain Vitamin B-12, vitamin D , preserve vision, ferrous sulfate   - supplements Lidocaine  patches - topical pain relief Allegra - allergies Nortriptyline  - insomnia Pantoprazole  - GERD Pravastatin  - hyperlipidemia Voltaren gel/naproxen - pain Journavx - pain Metanx -  neuropathy Estrace - post menopausal Senna/docusate-constipation     Type of Medication Issue Identified Description of Issue Recommendation(s)  Drug Interaction(s) (clinically significant)     Duplicate Therapy     Allergy     No Medication Administration End Date     Incorrect Dose     Additional Drug Therapy Needed     Significant med changes from prior encounter (inform family/care partners about these prior to discharge).    Other       Clinically significant medication issues were identified that warrant physician communication and completion of prescribed/recommended actions by midnight of the next day:  No  Pharmacist comments:    Time spent performing this drug regimen review (minutes):  20  Rome Schlauch A. Lyle, PharmD, BCPS, FNKF Clinical Pharmacist [facility] Please utilize Amion for appropriate phone number to reach the unit pharmacist [facility])  09/26/2024 10:18 AM

## 2024-09-26 NOTE — Progress Notes (Signed)
 Inpatient Rehabilitation Care Coordinator Discharge Note   Patient Details  Name: Marie Alvarez MRN: [id number] Date of Birth: 1931/11/05   Discharge location: Home alone with support from son, [name] and friends and family  Length of Stay: 14 days  Discharge activity level: Supervision/Verbal cueing  Home/community participation: Active within the community  Patient response un:Yzjouy Literacy - How often do you need to have someone help you when you read instructions, pamphlets, or other written material from your doctor or pharmacy?: Never  Patient response un:Dnrpjo Isolation - How often do you feel lonely or isolated from those around you?: Never  Services provided included: MD, RD, PT, OT, SLP, RN, CM, TR, Pharmacy, Neuropsych, SW  Financial Services:  Field seismologist Utilized: Private Insurance [employer]UE [employer] MEDICARE / BCBS MEDICARE  Choices offered to/list presented to: Patient/Family  Follow-up services arranged:  Outpatient    Outpatient Servicies:  Regional      Patient response to transportation need: Is the patient able to respond to transportation needs?: Yes In the past 12 months, has lack of transportation kept you from medical appointments or from getting medications?: No In the past 12 months, has lack of transportation kept you from meetings, work, or from getting things needed for daily living?: No   Patient/Family verbalized understanding of follow-up arrangements:  Yes  Individual responsible for coordination of the follow-up plan: Patient's son, [name]  Confirmed correct DME delivered: [employer]  Loreli 09/26/2024    Comments (or additional information): Family participated in family education and are able to address 24/7 care needs. All DME delivered to room prior to discharge.   Summary of Stay    Date/Time Discharge Planning CSW  09/25/24 1330 Patient return back home with an abundance of support from her son, [name] and grandson,  Donnice, and other family/friends/neighbors. DME ordered: [employer] and 16x16 wheelchair. OP PT/OT referral sent to [facility]. DS  09/24/24 1324 Patient return back home with an abundance of support from her son, [name] and grandson, Donnice, and other family/friends/neighbors. DME ordered: [facility] and 16x16 wheelchair. OP PT/OT referral sent to [facility] DS  09/16/24 1120 Patient plans to return back home with an abundance of support from her son, [name] and grandson, Donnice, and other family/friends/neighbors. Awaiting therapy follow up recommendations. DS       [employer]  Loreli

## 2024-10-01 ENCOUNTER — Ambulatory Visit: Attending: Physical Medicine and Rehabilitation | Admitting: Occupational Therapy

## 2024-10-01 ENCOUNTER — Ambulatory Visit: Admitting: Physical Therapy

## 2024-10-01 DIAGNOSIS — R2681 Unsteadiness on feet: Secondary | ICD-10-CM

## 2024-10-01 DIAGNOSIS — R262 Difficulty in walking, not elsewhere classified: Secondary | ICD-10-CM | POA: Insufficient documentation

## 2024-10-01 DIAGNOSIS — R2689 Other abnormalities of gait and mobility: Secondary | ICD-10-CM

## 2024-10-01 DIAGNOSIS — M6281 Muscle weakness (generalized): Secondary | ICD-10-CM | POA: Insufficient documentation

## 2024-10-01 DIAGNOSIS — R269 Unspecified abnormalities of gait and mobility: Secondary | ICD-10-CM | POA: Diagnosis not present

## 2024-10-01 DIAGNOSIS — R278 Other lack of coordination: Secondary | ICD-10-CM | POA: Diagnosis not present
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
## 2024-10-01 NOTE — Addendum Note (Signed)
 Addended by: Lamia Mariner M on: 10/01/2024 06:19 PM   Modules accepted: Orders
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
## 2024-10-02 ENCOUNTER — Other Ambulatory Visit: Payer: Self-pay | Admitting: Physical Medicine and Rehabilitation

## 2024-10-02 ENCOUNTER — Telehealth: Payer: Self-pay

## 2024-10-02 MED ORDER — JOURNAVX 50 MG PO TABS: 50.0000 mg | ORAL_TABLET | Freq: Two times a day (BID) | ORAL | 0 refills | Status: DC

## 2024-10-02 NOTE — Telephone Encounter (Signed)
 [facility] will not take [employer] coupon. Please send Rx to [facility]

## 2024-10-03 ENCOUNTER — Ambulatory Visit: Admitting: Occupational Therapy

## 2024-10-03 DIAGNOSIS — R262 Difficulty in walking, not elsewhere classified: Secondary | ICD-10-CM | POA: Diagnosis not present

## 2024-10-03 DIAGNOSIS — M6281 Muscle weakness (generalized): Secondary | ICD-10-CM | POA: Diagnosis not present

## 2024-10-03 DIAGNOSIS — R2681 Unsteadiness on feet: Secondary | ICD-10-CM | POA: Diagnosis not present

## 2024-10-03 DIAGNOSIS — R269 Unspecified abnormalities of gait and mobility: Secondary | ICD-10-CM | POA: Diagnosis not present

## 2024-10-03 DIAGNOSIS — R278 Other lack of coordination: Secondary | ICD-10-CM | POA: Diagnosis not present

## 2024-10-03 DIAGNOSIS — R2689 Other abnormalities of gait and mobility: Secondary | ICD-10-CM | POA: Diagnosis not present

## 2024-10-04 ENCOUNTER — Encounter: Payer: Self-pay | Admitting: Cardiovascular Disease

## 2024-10-04 ENCOUNTER — Ambulatory Visit: Attending: Cardiovascular Disease | Admitting: Cardiovascular Disease

## 2024-10-04 VITALS — BP 104/56 | HR 62 | Ht 63.5 in | Wt 131.0 lb

## 2024-10-04 DIAGNOSIS — I4719 Other supraventricular tachycardia: Secondary | ICD-10-CM | POA: Diagnosis not present

## 2024-10-04 DIAGNOSIS — R000 Tachycardia, unspecified: Secondary | ICD-10-CM

## 2024-10-04 MED ORDER — AMIODARONE HCL 200 MG PO TABS: 100.0000 mg | ORAL_TABLET | Freq: Every day | ORAL | Status: AC

## 2024-10-04 NOTE — Addendum Note (Signed)
 Addended by: CASIMIR ALDONA BRAVO on: 10/04/2024 03:03 PM   Modules accepted: Orders
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
## 2024-10-04 NOTE — Patient Instructions (Signed)
 Medication Instructions:  Your physician has recommended you make the following change in your medication:   ** Decrease Amiodarone 200mg  to 1/2 tablet by mouth daily.   *If you need a refill on your cardiac medications before your next appointment, please call your pharmacy*  Lab Work: None ordered.  If you have labs (blood work) drawn today and your tests are completely normal, you will receive your results only by: MyChart Message (if you have MyChart) OR A paper copy in the mail If you have any lab test that is abnormal or we need to change your treatment, we will call you to review the results.  Testing/Procedures: None ordered.   Follow-Up: At [facility], you and your health needs are our priority.  As part of our continuing mission to provide you with exceptional heart care, our providers are all part of one team.  This team includes your primary Cardiologist (physician) and Advanced Practice Providers or APPs (Physician Assistants and Nurse Practitioners) who all work together to provide you with the care you need, when you need it.  Your next appointment:   3 months with EP APP Lesia Barrack or [place].

## 2024-10-07 DIAGNOSIS — I10 Essential (primary) hypertension: Secondary | ICD-10-CM | POA: Diagnosis not present

## 2024-10-08 ENCOUNTER — Ambulatory Visit

## 2024-10-08 ENCOUNTER — Ambulatory Visit: Admitting: Physical Therapy

## 2024-10-08 DIAGNOSIS — R269 Unspecified abnormalities of gait and mobility: Secondary | ICD-10-CM

## 2024-10-08 DIAGNOSIS — R2681 Unsteadiness on feet: Secondary | ICD-10-CM

## 2024-10-08 DIAGNOSIS — M6281 Muscle weakness (generalized): Secondary | ICD-10-CM | POA: Diagnosis not present

## 2024-10-08 DIAGNOSIS — R2689 Other abnormalities of gait and mobility: Secondary | ICD-10-CM

## 2024-10-08 DIAGNOSIS — R262 Difficulty in walking, not elsewhere classified: Secondary | ICD-10-CM

## 2024-10-08 DIAGNOSIS — E559 Vitamin D deficiency, unspecified: Secondary | ICD-10-CM | POA: Diagnosis not present

## 2024-10-08 DIAGNOSIS — R278 Other lack of coordination: Secondary | ICD-10-CM

## 2024-10-08 DIAGNOSIS — D519 Vitamin B12 deficiency anemia, unspecified: Secondary | ICD-10-CM | POA: Diagnosis not present

## 2024-10-08 DIAGNOSIS — R7301 Impaired fasting glucose: Secondary | ICD-10-CM | POA: Diagnosis not present

## 2024-10-08 DIAGNOSIS — R946 Abnormal results of thyroid function studies: Secondary | ICD-10-CM | POA: Diagnosis not present

## 2024-10-08 DIAGNOSIS — E785 Hyperlipidemia, unspecified: Secondary | ICD-10-CM | POA: Diagnosis not present
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
## 2024-10-09 ENCOUNTER — Inpatient Hospital Stay (HOSPITAL_BASED_OUTPATIENT_CLINIC_OR_DEPARTMENT_OTHER)
Admission: EM | Admit: 2024-10-09 | Discharge: 2024-10-14 | DRG: 690 | Disposition: A | Discharge status: Home / Self Care | Attending: Internal Medicine | Admitting: Internal Medicine

## 2024-10-09 ENCOUNTER — Encounter (HOSPITAL_BASED_OUTPATIENT_CLINIC_OR_DEPARTMENT_OTHER): Payer: Self-pay | Admitting: Emergency Medicine

## 2024-10-09 ENCOUNTER — Emergency Department (HOSPITAL_BASED_OUTPATIENT_CLINIC_OR_DEPARTMENT_OTHER)

## 2024-10-09 ENCOUNTER — Other Ambulatory Visit: Payer: Self-pay

## 2024-10-09 DIAGNOSIS — I4729 Other ventricular tachycardia: Secondary | ICD-10-CM | POA: Diagnosis not present

## 2024-10-09 DIAGNOSIS — Z888 Allergy status to other drugs, medicaments and biological substances status: Secondary | ICD-10-CM

## 2024-10-09 DIAGNOSIS — Z8249 Family history of ischemic heart disease and other diseases of the circulatory system: Secondary | ICD-10-CM

## 2024-10-09 DIAGNOSIS — Z79899 Other long term (current) drug therapy: Secondary | ICD-10-CM

## 2024-10-09 DIAGNOSIS — Z79890 Hormone replacement therapy (postmenopausal): Secondary | ICD-10-CM | POA: Diagnosis not present

## 2024-10-09 DIAGNOSIS — Z881 Allergy status to other antibiotic agents status: Secondary | ICD-10-CM | POA: Diagnosis not present

## 2024-10-09 DIAGNOSIS — K56600 Partial intestinal obstruction, unspecified as to cause: Secondary | ICD-10-CM | POA: Diagnosis present

## 2024-10-09 DIAGNOSIS — R1084 Generalized abdominal pain: Secondary | ICD-10-CM | POA: Diagnosis not present

## 2024-10-09 DIAGNOSIS — K219 Gastro-esophageal reflux disease without esophagitis: Secondary | ICD-10-CM | POA: Diagnosis present

## 2024-10-09 DIAGNOSIS — Z882 Allergy status to sulfonamides status: Secondary | ICD-10-CM

## 2024-10-09 DIAGNOSIS — N200 Calculus of kidney: Secondary | ICD-10-CM | POA: Diagnosis not present

## 2024-10-09 DIAGNOSIS — R000 Tachycardia, unspecified: Secondary | ICD-10-CM | POA: Diagnosis not present

## 2024-10-09 DIAGNOSIS — E785 Hyperlipidemia, unspecified: Secondary | ICD-10-CM | POA: Diagnosis present

## 2024-10-09 DIAGNOSIS — K567 Ileus, unspecified: Secondary | ICD-10-CM | POA: Diagnosis present

## 2024-10-09 DIAGNOSIS — N3001 Acute cystitis with hematuria: Secondary | ICD-10-CM | POA: Diagnosis not present

## 2024-10-09 DIAGNOSIS — I10 Essential (primary) hypertension: Secondary | ICD-10-CM | POA: Diagnosis present

## 2024-10-09 DIAGNOSIS — E162 Hypoglycemia, unspecified: Secondary | ICD-10-CM | POA: Diagnosis not present

## 2024-10-09 DIAGNOSIS — K5900 Constipation, unspecified: Secondary | ICD-10-CM | POA: Diagnosis not present

## 2024-10-09 DIAGNOSIS — I4719 Other supraventricular tachycardia: Secondary | ICD-10-CM | POA: Diagnosis present

## 2024-10-09 DIAGNOSIS — Z923 Personal history of irradiation: Secondary | ICD-10-CM | POA: Diagnosis not present

## 2024-10-09 DIAGNOSIS — D259 Leiomyoma of uterus, unspecified: Secondary | ICD-10-CM | POA: Diagnosis not present

## 2024-10-09 DIAGNOSIS — K56609 Unspecified intestinal obstruction, unspecified as to partial versus complete obstruction: Secondary | ICD-10-CM | POA: Diagnosis not present

## 2024-10-09 DIAGNOSIS — Z853 Personal history of malignant neoplasm of breast: Secondary | ICD-10-CM | POA: Diagnosis not present

## 2024-10-09 DIAGNOSIS — I609 Nontraumatic subarachnoid hemorrhage, unspecified: Secondary | ICD-10-CM | POA: Diagnosis not present

## 2024-10-09 DIAGNOSIS — I479 Paroxysmal tachycardia, unspecified: Secondary | ICD-10-CM | POA: Diagnosis present

## 2024-10-09 DIAGNOSIS — Z9221 Personal history of antineoplastic chemotherapy: Secondary | ICD-10-CM

## 2024-10-09 DIAGNOSIS — S0083XA Contusion of other part of head, initial encounter: Secondary | ICD-10-CM | POA: Diagnosis not present

## 2024-10-09 DIAGNOSIS — K5730 Diverticulosis of large intestine without perforation or abscess without bleeding: Secondary | ICD-10-CM | POA: Diagnosis not present

## 2024-10-09 DIAGNOSIS — S0990XD Unspecified injury of head, subsequent encounter: Secondary | ICD-10-CM | POA: Diagnosis not present

## 2024-10-09 DIAGNOSIS — R109 Unspecified abdominal pain: Secondary | ICD-10-CM | POA: Diagnosis present

## 2024-10-09 LAB — CBC WITH DIFFERENTIAL/PLATELET
Abs Immature Granulocytes: 0.02 K/uL (ref 0.00–0.07)
Basophils Absolute: 0 K/uL (ref 0.0–0.1)
Basophils Relative: 0 %
Eosinophils Absolute: 0 K/uL (ref 0.0–0.5)
Eosinophils Relative: 0 %
HCT: 40.6 % (ref 36.0–46.0)
Hemoglobin: 13.7 g/dL (ref 12.0–15.0)
Immature Granulocytes: 0 %
Lymphocytes Relative: 5 %
Lymphs Abs: 0.6 K/uL — ABNORMAL LOW (ref 0.7–4.0)
MCH: 32.1 pg (ref 26.0–34.0)
MCHC: 33.7 g/dL (ref 30.0–36.0)
MCV: 95.1 fL (ref 80.0–100.0)
Monocytes Absolute: 0.6 K/uL (ref 0.1–1.0)
Monocytes Relative: 6 %
Neutro Abs: 10.1 K/uL — ABNORMAL HIGH (ref 1.7–7.7)
Neutrophils Relative %: 89 %
Platelets: 214 K/uL (ref 150–400)
RBC: 4.27 MIL/uL (ref 3.87–5.11)
RDW: 14 % (ref 11.5–15.5)
WBC: 11.3 K/uL — ABNORMAL HIGH (ref 4.0–10.5)
nRBC: 0 % (ref 0.0–0.2)

## 2024-10-09 LAB — COMPREHENSIVE METABOLIC PANEL WITH GFR
ALT: 12 U/L (ref 0–44)
AST: 21 U/L (ref 15–41)
Albumin: 4 g/dL (ref 3.5–5.0)
Alkaline Phosphatase: 73 U/L (ref 38–126)
Anion gap: 13 (ref 5–15)
BUN: 25 mg/dL — ABNORMAL HIGH (ref 8–23)
CO2: 23 mmol/L (ref 22–32)
Calcium: 10.4 mg/dL — ABNORMAL HIGH (ref 8.9–10.3)
Chloride: 101 mmol/L (ref 98–111)
Creatinine, Ser: 0.83 mg/dL (ref 0.44–1.00)
GFR, Estimated: >60 mL/min (ref >60)
Glucose, Bld: 138 mg/dL — ABNORMAL HIGH (ref 70–99)
Potassium: 4 mmol/L (ref 3.5–5.1)
Sodium: 137 mmol/L (ref 135–145)
Total Bilirubin: 0.7 mg/dL (ref 0.0–1.2)
Total Protein: 6.8 g/dL (ref 6.5–8.1)

## 2024-10-09 LAB — URINALYSIS, ROUTINE W REFLEX MICROSCOPIC
Bilirubin Urine: NEGATIVE
Glucose, UA: NEGATIVE mg/dL
Ketones, ur: 15 mg/dL — AB
Nitrite: NEGATIVE
Protein, ur: 30 mg/dL — AB
RBC / HPF: >50 RBC/hpf (ref 0–5)
Specific Gravity, Urine: 1.027 (ref 1.005–1.030)
WBC, UA: >50 WBC/hpf (ref 0–5)
pH: 5.5 (ref 5.0–8.0)

## 2024-10-09 LAB — LIPASE, BLOOD: Lipase: 13 U/L (ref 11–51)

## 2024-10-09 MED ORDER — IOHEXOL 300 MG/ML  SOLN
100.0000 mL | Freq: Once | INTRAMUSCULAR | Status: AC | PRN
Start: 2024-10-09 — End: 2024-10-09
  Administered 2024-10-09: 100 mL via INTRAVENOUS

## 2024-10-09 MED ORDER — SODIUM CHLORIDE 0.9 % IV BOLUS
500.0000 mL | Freq: Once | INTRAVENOUS | Status: AC
  Administered 2024-10-09: 500 mL via INTRAVENOUS

## 2024-10-09 MED ORDER — SODIUM CHLORIDE 0.9 % IV SOLN
1.0000 g | Freq: Once | INTRAVENOUS | Status: AC
  Administered 2024-10-09: 1 g via INTRAVENOUS
  Filled 2024-10-09: qty 10

## 2024-10-09 MED ORDER — ONDANSETRON HCL 4 MG/2ML IJ SOLN
4.0000 mg | Freq: Once | INTRAMUSCULAR | Status: AC
  Administered 2024-10-09: 4 mg via INTRAVENOUS
  Filled 2024-10-09: qty 2
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
## 2024-10-09 NOTE — ED Triage Notes (Signed)
 Nausea and vomiting since 6pm last night Hasn't eaten or tolerated oral Did not take night or morning meds

## 2024-10-09 NOTE — ED Notes (Signed)
 Patient placed on 2LNC at this time due to desaturations while sleeping. RN made aware.
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
## 2024-10-10 ENCOUNTER — Encounter (HOSPITAL_COMMUNITY): Payer: Self-pay | Admitting: Family Medicine

## 2024-10-10 ENCOUNTER — Ambulatory Visit: Admitting: Occupational Therapy

## 2024-10-10 DIAGNOSIS — K219 Gastro-esophageal reflux disease without esophagitis: Secondary | ICD-10-CM | POA: Diagnosis present

## 2024-10-10 DIAGNOSIS — Z888 Allergy status to other drugs, medicaments and biological substances status: Secondary | ICD-10-CM | POA: Diagnosis not present

## 2024-10-10 DIAGNOSIS — Z853 Personal history of malignant neoplasm of breast: Secondary | ICD-10-CM | POA: Diagnosis not present

## 2024-10-10 DIAGNOSIS — Z79890 Hormone replacement therapy (postmenopausal): Secondary | ICD-10-CM | POA: Diagnosis not present

## 2024-10-10 DIAGNOSIS — K56609 Unspecified intestinal obstruction, unspecified as to partial versus complete obstruction: Secondary | ICD-10-CM | POA: Diagnosis not present

## 2024-10-10 DIAGNOSIS — N3001 Acute cystitis with hematuria: Secondary | ICD-10-CM | POA: Diagnosis not present

## 2024-10-10 DIAGNOSIS — Z881 Allergy status to other antibiotic agents status: Secondary | ICD-10-CM | POA: Diagnosis not present

## 2024-10-10 DIAGNOSIS — D259 Leiomyoma of uterus, unspecified: Secondary | ICD-10-CM | POA: Diagnosis not present

## 2024-10-10 DIAGNOSIS — Z9221 Personal history of antineoplastic chemotherapy: Secondary | ICD-10-CM | POA: Diagnosis not present

## 2024-10-10 DIAGNOSIS — I4719 Other supraventricular tachycardia: Secondary | ICD-10-CM | POA: Diagnosis not present

## 2024-10-10 DIAGNOSIS — E162 Hypoglycemia, unspecified: Secondary | ICD-10-CM | POA: Diagnosis not present

## 2024-10-10 DIAGNOSIS — Z882 Allergy status to sulfonamides status: Secondary | ICD-10-CM | POA: Diagnosis not present

## 2024-10-10 DIAGNOSIS — I4729 Other ventricular tachycardia: Secondary | ICD-10-CM

## 2024-10-10 DIAGNOSIS — E785 Hyperlipidemia, unspecified: Secondary | ICD-10-CM | POA: Diagnosis present

## 2024-10-10 DIAGNOSIS — Z79899 Other long term (current) drug therapy: Secondary | ICD-10-CM | POA: Diagnosis not present

## 2024-10-10 DIAGNOSIS — Z8249 Family history of ischemic heart disease and other diseases of the circulatory system: Secondary | ICD-10-CM | POA: Diagnosis not present

## 2024-10-10 DIAGNOSIS — N200 Calculus of kidney: Secondary | ICD-10-CM | POA: Diagnosis not present

## 2024-10-10 DIAGNOSIS — K56600 Partial intestinal obstruction, unspecified as to cause: Secondary | ICD-10-CM | POA: Diagnosis not present

## 2024-10-10 DIAGNOSIS — K567 Ileus, unspecified: Secondary | ICD-10-CM | POA: Diagnosis not present

## 2024-10-10 DIAGNOSIS — Z923 Personal history of irradiation: Secondary | ICD-10-CM | POA: Diagnosis not present

## 2024-10-10 DIAGNOSIS — I10 Essential (primary) hypertension: Secondary | ICD-10-CM | POA: Diagnosis present

## 2024-10-10 LAB — BASIC METABOLIC PANEL WITH GFR
Anion gap: 13 (ref 5–15)
BUN: 19 mg/dL (ref 8–23)
CO2: 23 mmol/L (ref 22–32)
Calcium: 9.9 mg/dL (ref 8.9–10.3)
Chloride: 105 mmol/L (ref 98–111)
Creatinine, Ser: 0.73 mg/dL (ref 0.44–1.00)
GFR, Estimated: >60 mL/min (ref >60)
Glucose, Bld: 81 mg/dL (ref 70–99)
Potassium: 4 mmol/L (ref 3.5–5.1)
Sodium: 140 mmol/L (ref 135–145)

## 2024-10-10 LAB — CBC
HCT: 39.2 % (ref 36.0–46.0)
Hemoglobin: 12.3 g/dL (ref 12.0–15.0)
MCH: 31.9 pg (ref 26.0–34.0)
MCHC: 31.4 g/dL (ref 30.0–36.0)
MCV: 101.6 fL — ABNORMAL HIGH (ref 80.0–100.0)
Platelets: 181 K/uL (ref 150–400)
RBC: 3.86 MIL/uL — ABNORMAL LOW (ref 3.87–5.11)
RDW: 14.3 % (ref 11.5–15.5)
WBC: 8.5 K/uL (ref 4.0–10.5)
nRBC: 0 % (ref 0.0–0.2)

## 2024-10-10 LAB — MAGNESIUM: Magnesium: 2.2 mg/dL (ref 1.7–2.4)

## 2024-10-10 MED ORDER — SODIUM CHLORIDE 0.9% FLUSH
3.0000 mL | Freq: Two times a day (BID) | INTRAVENOUS | Status: DC
  Administered 2024-10-10 – 2024-10-13 (×7): 3 mL via INTRAVENOUS

## 2024-10-10 MED ORDER — ACETAMINOPHEN 650 MG RE SUPP: 650.0000 mg | Freq: Four times a day (QID) | RECTAL | Status: DC | PRN

## 2024-10-10 MED ORDER — PROCHLORPERAZINE EDISYLATE 10 MG/2ML IJ SOLN: 5.0000 mg | Freq: Four times a day (QID) | INTRAMUSCULAR | Status: DC | PRN

## 2024-10-10 MED ORDER — ENOXAPARIN SODIUM 40 MG/0.4ML IJ SOSY
40.0000 mg | PREFILLED_SYRINGE | INTRAMUSCULAR | Status: DC
  Administered 2024-10-10 – 2024-10-14 (×5): 40 mg via SUBCUTANEOUS
  Filled 2024-10-10 (×5): qty 0.4

## 2024-10-10 MED ORDER — AMIODARONE HCL 100 MG PO TABS
100.0000 mg | ORAL_TABLET | Freq: Every day | ORAL | Status: DC
  Administered 2024-10-10 – 2024-10-14 (×5): 100 mg via ORAL
  Filled 2024-10-10 (×5): qty 1

## 2024-10-10 MED ORDER — NORTRIPTYLINE HCL 10 MG PO CAPS
30.0000 mg | ORAL_CAPSULE | Freq: Every day | ORAL | Status: DC
  Administered 2024-10-10 – 2024-10-13 (×4): 30 mg via ORAL
  Filled 2024-10-10 (×5): qty 3

## 2024-10-10 MED ORDER — CHLORHEXIDINE GLUCONATE CLOTH 2 % EX PADS
6.0000 | MEDICATED_PAD | Freq: Every day | CUTANEOUS | Status: DC
  Administered 2024-10-10 – 2024-10-14 (×4): 6 via TOPICAL

## 2024-10-10 MED ORDER — PRAVASTATIN SODIUM 20 MG PO TABS
40.0000 mg | ORAL_TABLET | Freq: Every day | ORAL | Status: DC
  Administered 2024-10-10 – 2024-10-13 (×4): 40 mg via ORAL
  Filled 2024-10-10 (×4): qty 2

## 2024-10-10 MED ORDER — LACTATED RINGERS IV SOLN: INTRAVENOUS | Status: DC

## 2024-10-10 MED ORDER — ONDANSETRON HCL 4 MG PO TABS: 4.0000 mg | ORAL_TABLET | Freq: Four times a day (QID) | ORAL | Status: DC | PRN

## 2024-10-10 MED ORDER — PANTOPRAZOLE SODIUM 40 MG PO TBEC
40.0000 mg | DELAYED_RELEASE_TABLET | Freq: Every day | ORAL | Status: DC
  Administered 2024-10-10 – 2024-10-14 (×5): 40 mg via ORAL
  Filled 2024-10-10 (×5): qty 1

## 2024-10-10 MED ORDER — TAMSULOSIN HCL 0.4 MG PO CAPS
0.4000 mg | ORAL_CAPSULE | Freq: Every day | ORAL | Status: DC
  Administered 2024-10-10 – 2024-10-13 (×4): 0.4 mg via ORAL
  Filled 2024-10-10 (×4): qty 1

## 2024-10-10 MED ORDER — ONDANSETRON HCL 4 MG/2ML IJ SOLN
4.0000 mg | Freq: Four times a day (QID) | INTRAMUSCULAR | Status: DC | PRN
  Administered 2024-10-13: 4 mg via INTRAVENOUS
  Filled 2024-10-10: qty 2

## 2024-10-10 MED ORDER — METOPROLOL TARTRATE 50 MG PO TABS
50.0000 mg | ORAL_TABLET | Freq: Two times a day (BID) | ORAL | Status: DC
  Administered 2024-10-10 – 2024-10-14 (×9): 50 mg via ORAL
  Filled 2024-10-10 (×9): qty 1

## 2024-10-10 MED ORDER — SODIUM CHLORIDE 0.9 % IV SOLN
1.0000 g | INTRAVENOUS | Status: AC
  Administered 2024-10-10 – 2024-10-12 (×3): 1 g via INTRAVENOUS
  Filled 2024-10-10 (×3): qty 10

## 2024-10-10 MED ORDER — BISACODYL 10 MG RE SUPP
10.0000 mg | Freq: Once | RECTAL | Status: AC
  Administered 2024-10-10: 10 mg via RECTAL
  Filled 2024-10-10: qty 1

## 2024-10-10 MED ORDER — ACETAMINOPHEN 325 MG PO TABS
650.0000 mg | ORAL_TABLET | Freq: Four times a day (QID) | ORAL | Status: DC | PRN
  Administered 2024-10-11 – 2024-10-12 (×2): 650 mg via ORAL
  Filled 2024-10-10 (×2): qty 2
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
## 2024-10-10 NOTE — Plan of Care (Signed)
   Problem: Education: Goal: Knowledge of General Education information will improve Description Including pain rating scale, medication(s)/side effects and non-pharmacologic comfort measures Outcome: Progressing
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
## 2024-10-10 NOTE — Progress Notes (Signed)
 Pt arrived to unit via stretcher by [facility] (2 at side). Pt is warm, dry, no visible distress. Paged [facility] admitting to inform them of pt's arrival to room 1307. Awaiting return call.

## 2024-10-10 NOTE — Progress Notes (Signed)
 Staff from [facility] returned call to nurse. Informed her of pt's arrival to unit, room 1307. She stated that she would page the doctor to make them aware of this.

## 2024-10-11 ENCOUNTER — Inpatient Hospital Stay (HOSPITAL_COMMUNITY)

## 2024-10-11 DIAGNOSIS — K567 Ileus, unspecified: Secondary | ICD-10-CM | POA: Diagnosis not present

## 2024-10-11 DIAGNOSIS — N200 Calculus of kidney: Secondary | ICD-10-CM | POA: Diagnosis not present

## 2024-10-11 DIAGNOSIS — K56609 Unspecified intestinal obstruction, unspecified as to partial versus complete obstruction: Secondary | ICD-10-CM | POA: Diagnosis not present

## 2024-10-11 DIAGNOSIS — D259 Leiomyoma of uterus, unspecified: Secondary | ICD-10-CM | POA: Diagnosis not present

## 2024-10-11 LAB — GLUCOSE, CAPILLARY
Glucose-Capillary: 186 mg/dL — ABNORMAL HIGH (ref 70–99)
Glucose-Capillary: 53 mg/dL — ABNORMAL LOW (ref 70–99)
Glucose-Capillary: 136 mg/dL — ABNORMAL HIGH (ref 70–99)
Glucose-Capillary: 174 mg/dL — ABNORMAL HIGH (ref 70–99)

## 2024-10-11 LAB — BASIC METABOLIC PANEL WITH GFR
Anion gap: 16 — ABNORMAL HIGH (ref 5–15)
BUN: 15 mg/dL (ref 8–23)
CO2: 22 mmol/L (ref 22–32)
Calcium: 9.4 mg/dL (ref 8.9–10.3)
Chloride: 104 mmol/L (ref 98–111)
Creatinine, Ser: 0.58 mg/dL (ref 0.44–1.00)
GFR, Estimated: >60 mL/min (ref >60)
Glucose, Bld: 60 mg/dL — ABNORMAL LOW (ref 70–99)
Potassium: 3.9 mmol/L (ref 3.5–5.1)
Sodium: 142 mmol/L (ref 135–145)

## 2024-10-11 LAB — URINE CULTURE

## 2024-10-11 LAB — CBC
HCT: 36 % (ref 36.0–46.0)
Hemoglobin: 11.2 g/dL — ABNORMAL LOW (ref 12.0–15.0)
MCH: 31.2 pg (ref 26.0–34.0)
MCHC: 31.1 g/dL (ref 30.0–36.0)
MCV: 100.3 fL — ABNORMAL HIGH (ref 80.0–100.0)
Platelets: 152 K/uL (ref 150–400)
RBC: 3.59 MIL/uL — ABNORMAL LOW (ref 3.87–5.11)
RDW: 13.2 % (ref 11.5–15.5)
WBC: 5.8 K/uL (ref 4.0–10.5)
nRBC: 0 % (ref 0.0–0.2)

## 2024-10-11 MED ORDER — DEXTROSE IN LACTATED RINGERS 5 % IV SOLN: INTRAVENOUS | Status: DC

## 2024-10-11 MED ORDER — DEXTROSE 50 % IV SOLN
INTRAVENOUS | Status: AC
  Administered 2024-10-11: 50 mL
  Filled 2024-10-11: qty 50

## 2024-10-11 MED ORDER — DEXTROSE 50 % IV SOLN: 25.0000 g | INTRAVENOUS | Status: AC

## 2024-10-11 MED ORDER — BOOST / RESOURCE BREEZE PO LIQD CUSTOM
1.0000 | Freq: Three times a day (TID) | ORAL | Status: DC
  Administered 2024-10-11 – 2024-10-12 (×2): 1 via ORAL

## 2024-10-11 MED ORDER — BISACODYL 10 MG RE SUPP
10.0000 mg | Freq: Once | RECTAL | Status: AC
  Administered 2024-10-11: 10 mg via RECTAL
  Filled 2024-10-11: qty 1

## 2024-10-11 MED ORDER — SENNOSIDES-DOCUSATE SODIUM 8.6-50 MG PO TABS
1.0000 | ORAL_TABLET | Freq: Two times a day (BID) | ORAL | Status: DC
  Administered 2024-10-11 – 2024-10-14 (×7): 1 via ORAL
  Filled 2024-10-11 (×7): qty 1

## 2024-10-11 MED ORDER — POLYETHYLENE GLYCOL 3350 17 G PO PACK
17.0000 g | PACK | Freq: Two times a day (BID) | ORAL | Status: DC
  Administered 2024-10-11 – 2024-10-14 (×6): 17 g via ORAL
  Filled 2024-10-11 (×7): qty 1
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
## 2024-10-11 NOTE — Progress Notes (Signed)
 Hypoglycemic Event  CBG: 53 @0845   Treatment: D50 50 mL (25 gm) given @0905   Symptoms: Hungry  Follow-up CBG: Time:0920 CBG Result:186  Possible Reasons for Event: Other: NPO  Comments/MD notified:Dr. Regalado aware @ [phone]. No new orders received.    Marie Alvarez

## 2024-10-11 NOTE — Plan of Care (Signed)
   Problem: Education: Goal: Knowledge of General Education information will improve Description Including pain rating scale, medication(s)/side effects and non-pharmacologic comfort measures Outcome: Progressing   Problem: Health Behavior/Discharge Planning: Goal: Ability to manage health-related needs will improve Outcome: Progressing

## 2024-10-12 DIAGNOSIS — K567 Ileus, unspecified: Secondary | ICD-10-CM | POA: Diagnosis not present

## 2024-10-12 LAB — GLUCOSE, CAPILLARY
Glucose-Capillary: 65 mg/dL — ABNORMAL LOW (ref 70–99)
Glucose-Capillary: 101 mg/dL — ABNORMAL HIGH (ref 70–99)
Glucose-Capillary: 142 mg/dL — ABNORMAL HIGH (ref 70–99)
Glucose-Capillary: 138 mg/dL — ABNORMAL HIGH (ref 70–99)

## 2024-10-12 LAB — CBC
HCT: 35.1 % — ABNORMAL LOW (ref 36.0–46.0)
Hemoglobin: 11.5 g/dL — ABNORMAL LOW (ref 12.0–15.0)
MCH: 31.9 pg (ref 26.0–34.0)
MCHC: 32.8 g/dL (ref 30.0–36.0)
MCV: 97.5 fL (ref 80.0–100.0)
Platelets: 141 K/uL — ABNORMAL LOW (ref 150–400)
RBC: 3.6 MIL/uL — ABNORMAL LOW (ref 3.87–5.11)
RDW: 13.2 % (ref 11.5–15.5)
WBC: 5.6 K/uL (ref 4.0–10.5)
nRBC: 0 % (ref 0.0–0.2)

## 2024-10-12 LAB — BASIC METABOLIC PANEL WITH GFR
Anion gap: 9 (ref 5–15)
BUN: 9 mg/dL (ref 8–23)
CO2: 24 mmol/L (ref 22–32)
Calcium: 9.3 mg/dL (ref 8.9–10.3)
Chloride: 104 mmol/L (ref 98–111)
Creatinine, Ser: 0.49 mg/dL (ref 0.44–1.00)
GFR, Estimated: >60 mL/min (ref >60)
Glucose, Bld: 107 mg/dL — ABNORMAL HIGH (ref 70–99)
Potassium: 3.6 mmol/L (ref 3.5–5.1)
Sodium: 137 mmol/L (ref 135–145)

## 2024-10-12 MED ORDER — CEPHALEXIN 500 MG PO CAPS
500.0000 mg | ORAL_CAPSULE | Freq: Three times a day (TID) | ORAL | Status: DC
  Administered 2024-10-12 – 2024-10-13 (×2): 500 mg via ORAL
  Filled 2024-10-12 (×2): qty 1

## 2024-10-12 MED ORDER — LACTULOSE 10 GM/15ML PO SOLN: 20.0000 g | Freq: Two times a day (BID) | ORAL | Status: DC | PRN

## 2024-10-12 MED ORDER — BOOST PLUS PO LIQD
237.0000 mL | Freq: Three times a day (TID) | ORAL | Status: DC
  Administered 2024-10-12 – 2024-10-14 (×4): 237 mL via ORAL
  Filled 2024-10-12 (×8): qty 237

## 2024-10-12 NOTE — Plan of Care (Signed)
   Problem: Education: Goal: Knowledge of General Education information will improve Description Including pain rating scale, medication(s)/side effects and non-pharmacologic comfort measures Outcome: Progressing   Problem: Health Behavior/Discharge Planning: Goal: Ability to manage health-related needs will improve Outcome: Progressing

## 2024-10-12 NOTE — Progress Notes (Addendum)
 Hypoglycemic Event  CBG: 65  Treatment: 4 oz juice/soda  Symptoms: None  Follow-up CBG: 0650Time: CBG Result:101  Possible Reasons for Event: Unknown  Comments/MD notified:    Marie Alvarez
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
## 2024-10-12 NOTE — Progress Notes (Signed)
 Mobility Specialist - Progress Note   During mobility: 78 BPM HR, 147/64 mmHg BP, 96% SpO2    10/12/24 1616  Mobility  Activity Dangled on edge of bed  Level of Assistance Minimal assist, patient does 75% or more  Range of Motion/Exercises Active  Activity Response Tolerated fair  Mobility Referral Yes  Mobility visit 1 Mobility  Mobility Specialist Start Time (ACUTE ONLY) 1558  Mobility Specialist Stop Time (ACUTE ONLY) 1618  Mobility Specialist Time Calculation (min) (ACUTE ONLY) 20 min   Pt was received in bed and agreed to mobility. Min A bed mobility. Pt stated dizziness (checked vitals), but subsided at edge of bed. Opted for bedside exercises: Exercises: Leg Extensions: 1 x 5 leg Knee Marches: 1 x 5 each leg Returned to bed with all needs met, call bell in reach.  [employer] - Mobility Specialist

## 2024-10-13 DIAGNOSIS — K567 Ileus, unspecified: Secondary | ICD-10-CM | POA: Diagnosis not present

## 2024-10-13 LAB — CBC
HCT: 38.2 % (ref 36.0–46.0)
Hemoglobin: 12.2 g/dL (ref 12.0–15.0)
MCH: 31.1 pg (ref 26.0–34.0)
MCHC: 31.9 g/dL (ref 30.0–36.0)
MCV: 97.4 fL (ref 80.0–100.0)
Platelets: 161 K/uL (ref 150–400)
RBC: 3.92 MIL/uL (ref 3.87–5.11)
RDW: 13.3 % (ref 11.5–15.5)
WBC: 5.8 K/uL (ref 4.0–10.5)
nRBC: 0 % (ref 0.0–0.2)

## 2024-10-13 LAB — BASIC METABOLIC PANEL WITH GFR
Anion gap: 8 (ref 5–15)
BUN: 6 mg/dL — ABNORMAL LOW (ref 8–23)
CO2: 25 mmol/L (ref 22–32)
Calcium: 9.5 mg/dL (ref 8.9–10.3)
Chloride: 104 mmol/L (ref 98–111)
Creatinine, Ser: 0.51 mg/dL (ref 0.44–1.00)
GFR, Estimated: >60 mL/min (ref >60)
Glucose, Bld: 106 mg/dL — ABNORMAL HIGH (ref 70–99)
Potassium: 3.7 mmol/L (ref 3.5–5.1)
Sodium: 137 mmol/L (ref 135–145)

## 2024-10-13 LAB — GLUCOSE, CAPILLARY
Glucose-Capillary: 104 mg/dL — ABNORMAL HIGH (ref 70–99)
Glucose-Capillary: 151 mg/dL — ABNORMAL HIGH (ref 70–99)
Glucose-Capillary: 110 mg/dL — ABNORMAL HIGH (ref 70–99)

## 2024-10-13 MED ORDER — LORATADINE 10 MG PO TABS
10.0000 mg | ORAL_TABLET | Freq: Every day | ORAL | Status: DC
  Administered 2024-10-13 – 2024-10-14 (×2): 10 mg via ORAL
  Filled 2024-10-13 (×2): qty 1

## 2024-10-13 MED ORDER — SODIUM CHLORIDE 0.9 % IV SOLN
2.0000 g | Freq: Every day | INTRAVENOUS | Status: DC
Start: 2024-10-13 — End: 2024-10-16
  Administered 2024-10-13 – 2024-10-14 (×2): 2 g via INTRAVENOUS
  Filled 2024-10-13 (×2): qty 20
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
## 2024-10-13 NOTE — Plan of Care (Signed)

## 2024-10-14 DIAGNOSIS — K567 Ileus, unspecified: Secondary | ICD-10-CM | POA: Diagnosis not present

## 2024-10-14 LAB — GLUCOSE, CAPILLARY: Glucose-Capillary: 107 mg/dL — ABNORMAL HIGH (ref 70–99)

## 2024-10-14 MED ORDER — VITAMIN D 25 MCG (1000 UNIT) PO TABS: 1000.0000 [IU] | ORAL_TABLET | Freq: Every day | ORAL | Status: DC

## 2024-10-14 MED ORDER — ONDANSETRON HCL 4 MG PO TABS: 4.0000 mg | ORAL_TABLET | Freq: Four times a day (QID) | ORAL | 0 refills | Status: AC | PRN

## 2024-10-14 MED ORDER — BISACODYL 10 MG RE SUPP: 10.0000 mg | RECTAL | 0 refills | Status: AC | PRN

## 2024-10-14 MED ORDER — SENNOSIDES-DOCUSATE SODIUM 8.6-50 MG PO TABS: 1.0000 | ORAL_TABLET | Freq: Two times a day (BID) | ORAL | 0 refills | Status: AC

## 2024-10-14 MED ORDER — ACETAMINOPHEN ER 650 MG PO TBCR: 650.0000 mg | EXTENDED_RELEASE_TABLET | Freq: Three times a day (TID) | ORAL | 0 refills | Status: AC | PRN

## 2024-10-14 MED ORDER — POLYETHYLENE GLYCOL 3350 17 G PO PACK: 17.0000 g | PACK | Freq: Two times a day (BID) | ORAL | 0 refills | Status: AC

## 2024-10-14 MED ORDER — CEPHALEXIN 500 MG PO CAPS: 500.0000 mg | ORAL_CAPSULE | Freq: Three times a day (TID) | ORAL | 0 refills | Status: AC

## 2024-10-14 MED ORDER — FERROUS SULFATE 325 (65 FE) MG PO TABS
325.0000 mg | ORAL_TABLET | Freq: Every day | ORAL | Status: DC
  Administered 2024-10-14: 325 mg via ORAL
  Filled 2024-10-14: qty 1

## 2024-10-14 MED ORDER — MAGNESIUM GLUCONATE 500 (27 MG) MG PO TABS
500.0000 mg | ORAL_TABLET | Freq: Every day | ORAL | Status: DC
  Filled 2024-10-14: qty 1

## 2024-10-14 NOTE — Progress Notes (Signed)
 All questions and concerns answered prior to discharge, patient left hospital via wheelchair with nurse tech

## 2024-10-14 NOTE — TOC Transition Note (Signed)
 Transition of Care [facility]) - Discharge Note   Patient Details  Name: TYREONNA CZAPLICKI MRN: [id number] Date of Birth: [birth year]  [facility] CM/SW Contact:  NORMAN ASPEN, LCSW Phone Number: 10/14/2024, 1:17 PM   Clinical Narrative:     Spoke with pt and son who note plan for dc home today.  Requesting that recommended OP therapy be arranged with [facility].  Referral and orders sent over in [facility] hub.  No further IP CM needs.  Final next level of care: OP Rehab Barriers to Discharge: No Barriers Identified   Patient Goals and CMS Choice Patient states their goals for this hospitalization and ongoing recovery are:: return home          Discharge Placement                       Discharge Plan and Services Additional resources added to the After Visit Summary for                  DME Arranged: N/A DME Agency: NA                  Social Drivers of Health (SDOH) Interventions SDOH Screenings   Food Insecurity: No Food Insecurity (10/10/2024)  Housing: Low Risk  (10/10/2024)  Transportation Needs: No Transportation Needs (10/10/2024)  Utilities: Not At Risk (10/10/2024)  Social Connections: Patient Declined (10/10/2024)  Tobacco Use: Low Risk  (10/10/2024)     Readmission Risk Interventions     No data to display

## 2024-10-14 NOTE — Plan of Care (Signed)
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
## 2024-10-14 NOTE — Progress Notes (Signed)
 While assessing patient with student nurse, Warren Bruns, [facility], a pill labeled Teva 10mg  [phone] was found at the sink. Medication was disposed.  -Brynda Officer, MSN, RN, [facility] Nursing Faculty

## 2024-10-15 ENCOUNTER — Ambulatory Visit: Attending: Physical Medicine and Rehabilitation | Admitting: Occupational Therapy

## 2024-10-15 DIAGNOSIS — Z0000 Encounter for general adult medical examination without abnormal findings: Secondary | ICD-10-CM | POA: Diagnosis not present

## 2024-10-15 DIAGNOSIS — R262 Difficulty in walking, not elsewhere classified: Secondary | ICD-10-CM | POA: Diagnosis not present

## 2024-10-15 DIAGNOSIS — R82998 Other abnormal findings in urine: Secondary | ICD-10-CM | POA: Diagnosis not present

## 2024-10-15 DIAGNOSIS — R2681 Unsteadiness on feet: Secondary | ICD-10-CM | POA: Diagnosis not present

## 2024-10-15 DIAGNOSIS — M6281 Muscle weakness (generalized): Secondary | ICD-10-CM | POA: Diagnosis not present

## 2024-10-15 DIAGNOSIS — Z23 Encounter for immunization: Secondary | ICD-10-CM | POA: Diagnosis not present

## 2024-10-15 DIAGNOSIS — R2689 Other abnormalities of gait and mobility: Secondary | ICD-10-CM | POA: Insufficient documentation

## 2024-10-15 DIAGNOSIS — R269 Unspecified abnormalities of gait and mobility: Secondary | ICD-10-CM | POA: Diagnosis not present

## 2024-10-15 DIAGNOSIS — R278 Other lack of coordination: Secondary | ICD-10-CM | POA: Insufficient documentation

## 2024-10-15 DIAGNOSIS — I10 Essential (primary) hypertension: Secondary | ICD-10-CM | POA: Diagnosis not present
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
## 2024-10-17 ENCOUNTER — Ambulatory Visit: Admitting: Physical Therapy

## 2024-10-17 DIAGNOSIS — R278 Other lack of coordination: Secondary | ICD-10-CM | POA: Diagnosis not present

## 2024-10-17 DIAGNOSIS — R262 Difficulty in walking, not elsewhere classified: Secondary | ICD-10-CM

## 2024-10-17 DIAGNOSIS — R269 Unspecified abnormalities of gait and mobility: Secondary | ICD-10-CM

## 2024-10-17 DIAGNOSIS — M6281 Muscle weakness (generalized): Secondary | ICD-10-CM | POA: Diagnosis not present

## 2024-10-17 DIAGNOSIS — R2681 Unsteadiness on feet: Secondary | ICD-10-CM | POA: Diagnosis not present

## 2024-10-17 DIAGNOSIS — R2689 Other abnormalities of gait and mobility: Secondary | ICD-10-CM | POA: Diagnosis not present
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
## 2024-10-21 DIAGNOSIS — M25531 Pain in right wrist: Secondary | ICD-10-CM | POA: Diagnosis not present

## 2024-10-21 DIAGNOSIS — S42212D Unspecified displaced fracture of surgical neck of left humerus, subsequent encounter for fracture with routine healing: Secondary | ICD-10-CM | POA: Diagnosis not present

## 2024-10-21 DIAGNOSIS — S82832D Other fracture of upper and lower end of left fibula, subsequent encounter for closed fracture with routine healing: Secondary | ICD-10-CM | POA: Diagnosis not present

## 2024-10-21 DIAGNOSIS — S92511D Displaced fracture of proximal phalanx of right lesser toe(s), subsequent encounter for fracture with routine healing: Secondary | ICD-10-CM | POA: Diagnosis not present

## 2024-10-22 ENCOUNTER — Ambulatory Visit: Admitting: Occupational Therapy

## 2024-10-22 DIAGNOSIS — M6281 Muscle weakness (generalized): Secondary | ICD-10-CM | POA: Diagnosis not present

## 2024-10-22 DIAGNOSIS — R269 Unspecified abnormalities of gait and mobility: Secondary | ICD-10-CM | POA: Diagnosis not present

## 2024-10-22 DIAGNOSIS — R278 Other lack of coordination: Secondary | ICD-10-CM | POA: Diagnosis not present

## 2024-10-22 DIAGNOSIS — R2681 Unsteadiness on feet: Secondary | ICD-10-CM | POA: Diagnosis not present

## 2024-10-22 DIAGNOSIS — R2689 Other abnormalities of gait and mobility: Secondary | ICD-10-CM | POA: Diagnosis not present

## 2024-10-22 DIAGNOSIS — R262 Difficulty in walking, not elsewhere classified: Secondary | ICD-10-CM | POA: Diagnosis not present
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
## 2024-10-24 ENCOUNTER — Ambulatory Visit: Admitting: Occupational Therapy

## 2024-10-24 DIAGNOSIS — R278 Other lack of coordination: Secondary | ICD-10-CM | POA: Diagnosis not present

## 2024-10-24 DIAGNOSIS — R2689 Other abnormalities of gait and mobility: Secondary | ICD-10-CM | POA: Diagnosis not present

## 2024-10-24 DIAGNOSIS — M6281 Muscle weakness (generalized): Secondary | ICD-10-CM | POA: Diagnosis not present

## 2024-10-24 DIAGNOSIS — R262 Difficulty in walking, not elsewhere classified: Secondary | ICD-10-CM | POA: Diagnosis not present

## 2024-10-24 DIAGNOSIS — R269 Unspecified abnormalities of gait and mobility: Secondary | ICD-10-CM | POA: Diagnosis not present

## 2024-10-24 DIAGNOSIS — R2681 Unsteadiness on feet: Secondary | ICD-10-CM | POA: Diagnosis not present
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
## 2024-10-29 ENCOUNTER — Ambulatory Visit: Admitting: Occupational Therapy

## 2024-10-29 DIAGNOSIS — M6281 Muscle weakness (generalized): Secondary | ICD-10-CM | POA: Diagnosis not present

## 2024-10-29 DIAGNOSIS — R2681 Unsteadiness on feet: Secondary | ICD-10-CM | POA: Diagnosis not present

## 2024-10-29 DIAGNOSIS — R278 Other lack of coordination: Secondary | ICD-10-CM | POA: Diagnosis not present

## 2024-10-29 DIAGNOSIS — R262 Difficulty in walking, not elsewhere classified: Secondary | ICD-10-CM | POA: Diagnosis not present

## 2024-10-29 DIAGNOSIS — R2689 Other abnormalities of gait and mobility: Secondary | ICD-10-CM | POA: Diagnosis not present

## 2024-10-29 DIAGNOSIS — R269 Unspecified abnormalities of gait and mobility: Secondary | ICD-10-CM | POA: Diagnosis not present

## 2024-10-30 ENCOUNTER — Ambulatory Visit: Admitting: Occupational Therapy

## 2024-10-30 DIAGNOSIS — M6281 Muscle weakness (generalized): Secondary | ICD-10-CM

## 2024-10-30 DIAGNOSIS — R262 Difficulty in walking, not elsewhere classified: Secondary | ICD-10-CM | POA: Diagnosis not present

## 2024-10-30 DIAGNOSIS — R2689 Other abnormalities of gait and mobility: Secondary | ICD-10-CM | POA: Diagnosis not present

## 2024-10-30 DIAGNOSIS — R278 Other lack of coordination: Secondary | ICD-10-CM | POA: Diagnosis not present

## 2024-10-30 DIAGNOSIS — R2681 Unsteadiness on feet: Secondary | ICD-10-CM | POA: Diagnosis not present

## 2024-10-30 DIAGNOSIS — R269 Unspecified abnormalities of gait and mobility: Secondary | ICD-10-CM | POA: Diagnosis not present
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
## 2024-10-31 ENCOUNTER — Ambulatory Visit: Admitting: Occupational Therapy

## 2024-10-31 ENCOUNTER — Ambulatory Visit: Admitting: Physical Therapy

## 2024-10-31 DIAGNOSIS — R269 Unspecified abnormalities of gait and mobility: Secondary | ICD-10-CM | POA: Diagnosis not present

## 2024-10-31 DIAGNOSIS — R2681 Unsteadiness on feet: Secondary | ICD-10-CM | POA: Diagnosis not present

## 2024-10-31 DIAGNOSIS — R278 Other lack of coordination: Secondary | ICD-10-CM | POA: Diagnosis not present

## 2024-10-31 DIAGNOSIS — M6281 Muscle weakness (generalized): Secondary | ICD-10-CM

## 2024-10-31 DIAGNOSIS — R2689 Other abnormalities of gait and mobility: Secondary | ICD-10-CM | POA: Diagnosis not present

## 2024-10-31 DIAGNOSIS — R262 Difficulty in walking, not elsewhere classified: Secondary | ICD-10-CM | POA: Diagnosis not present
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
## 2024-11-05 ENCOUNTER — Ambulatory Visit: Admitting: Occupational Therapy

## 2024-11-05 ENCOUNTER — Ambulatory Visit: Admitting: Physical Therapy

## 2024-11-05 DIAGNOSIS — R2689 Other abnormalities of gait and mobility: Secondary | ICD-10-CM | POA: Diagnosis not present

## 2024-11-05 DIAGNOSIS — M6281 Muscle weakness (generalized): Secondary | ICD-10-CM | POA: Diagnosis not present

## 2024-11-05 DIAGNOSIS — R269 Unspecified abnormalities of gait and mobility: Secondary | ICD-10-CM

## 2024-11-05 DIAGNOSIS — R262 Difficulty in walking, not elsewhere classified: Secondary | ICD-10-CM

## 2024-11-05 DIAGNOSIS — R278 Other lack of coordination: Secondary | ICD-10-CM

## 2024-11-05 DIAGNOSIS — R2681 Unsteadiness on feet: Secondary | ICD-10-CM | POA: Diagnosis not present
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
## 2024-11-06 ENCOUNTER — Ambulatory Visit: Admitting: Occupational Therapy

## 2024-11-06 DIAGNOSIS — R278 Other lack of coordination: Secondary | ICD-10-CM | POA: Diagnosis not present

## 2024-11-06 DIAGNOSIS — M6281 Muscle weakness (generalized): Secondary | ICD-10-CM | POA: Diagnosis not present

## 2024-11-06 DIAGNOSIS — R2681 Unsteadiness on feet: Secondary | ICD-10-CM | POA: Diagnosis not present

## 2024-11-06 DIAGNOSIS — R2689 Other abnormalities of gait and mobility: Secondary | ICD-10-CM | POA: Diagnosis not present

## 2024-11-06 DIAGNOSIS — R269 Unspecified abnormalities of gait and mobility: Secondary | ICD-10-CM | POA: Diagnosis not present

## 2024-11-06 DIAGNOSIS — R262 Difficulty in walking, not elsewhere classified: Secondary | ICD-10-CM | POA: Diagnosis not present
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
## 2024-11-12 ENCOUNTER — Ambulatory Visit: Admitting: Occupational Therapy

## 2024-11-12 ENCOUNTER — Ambulatory Visit: Attending: Physical Medicine and Rehabilitation | Admitting: Physical Therapy

## 2024-11-12 DIAGNOSIS — R262 Difficulty in walking, not elsewhere classified: Secondary | ICD-10-CM | POA: Diagnosis present

## 2024-11-12 DIAGNOSIS — R2681 Unsteadiness on feet: Secondary | ICD-10-CM | POA: Diagnosis present

## 2024-11-12 DIAGNOSIS — R278 Other lack of coordination: Secondary | ICD-10-CM | POA: Insufficient documentation

## 2024-11-12 DIAGNOSIS — M6281 Muscle weakness (generalized): Secondary | ICD-10-CM | POA: Diagnosis present

## 2024-11-12 DIAGNOSIS — R269 Unspecified abnormalities of gait and mobility: Secondary | ICD-10-CM | POA: Diagnosis present

## 2024-11-12 DIAGNOSIS — R2689 Other abnormalities of gait and mobility: Secondary | ICD-10-CM | POA: Diagnosis present
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
## 2024-11-13 ENCOUNTER — Ambulatory Visit: Admitting: Occupational Therapy

## 2024-11-13 DIAGNOSIS — M6281 Muscle weakness (generalized): Secondary | ICD-10-CM | POA: Diagnosis not present
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
## 2024-11-14 ENCOUNTER — Ambulatory Visit: Admitting: Physical Therapy

## 2024-11-14 ENCOUNTER — Ambulatory Visit: Admitting: Occupational Therapy

## 2024-11-14 DIAGNOSIS — R54 Age-related physical debility: Secondary | ICD-10-CM | POA: Diagnosis not present

## 2024-11-14 DIAGNOSIS — R262 Difficulty in walking, not elsewhere classified: Secondary | ICD-10-CM

## 2024-11-14 DIAGNOSIS — Z4689 Encounter for fitting and adjustment of other specified devices: Secondary | ICD-10-CM | POA: Diagnosis not present

## 2024-11-14 DIAGNOSIS — M6281 Muscle weakness (generalized): Secondary | ICD-10-CM

## 2024-11-14 DIAGNOSIS — N898 Other specified noninflammatory disorders of vagina: Secondary | ICD-10-CM | POA: Diagnosis not present

## 2024-11-14 DIAGNOSIS — R8290 Unspecified abnormal findings in urine: Secondary | ICD-10-CM | POA: Diagnosis not present

## 2024-11-14 DIAGNOSIS — R2681 Unsteadiness on feet: Secondary | ICD-10-CM

## 2024-11-14 DIAGNOSIS — R269 Unspecified abnormalities of gait and mobility: Secondary | ICD-10-CM

## 2024-11-14 DIAGNOSIS — N952 Postmenopausal atrophic vaginitis: Secondary | ICD-10-CM | POA: Diagnosis not present

## 2024-11-14 DIAGNOSIS — R2689 Other abnormalities of gait and mobility: Secondary | ICD-10-CM
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
## 2024-11-19 ENCOUNTER — Ambulatory Visit: Admitting: Physical Therapy

## 2024-11-19 ENCOUNTER — Ambulatory Visit: Admitting: Occupational Therapy

## 2024-11-19 DIAGNOSIS — M6281 Muscle weakness (generalized): Secondary | ICD-10-CM | POA: Diagnosis not present

## 2024-11-19 DIAGNOSIS — R278 Other lack of coordination: Secondary | ICD-10-CM

## 2024-11-19 DIAGNOSIS — R269 Unspecified abnormalities of gait and mobility: Secondary | ICD-10-CM

## 2024-11-19 DIAGNOSIS — R262 Difficulty in walking, not elsewhere classified: Secondary | ICD-10-CM

## 2024-11-19 DIAGNOSIS — R2681 Unsteadiness on feet: Secondary | ICD-10-CM

## 2024-11-19 DIAGNOSIS — R2689 Other abnormalities of gait and mobility: Secondary | ICD-10-CM
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
## 2024-11-20 ENCOUNTER — Ambulatory Visit

## 2024-11-20 DIAGNOSIS — R278 Other lack of coordination: Secondary | ICD-10-CM

## 2024-11-20 DIAGNOSIS — S42212D Unspecified displaced fracture of surgical neck of left humerus, subsequent encounter for fracture with routine healing: Secondary | ICD-10-CM | POA: Diagnosis not present

## 2024-11-20 DIAGNOSIS — M6281 Muscle weakness (generalized): Secondary | ICD-10-CM | POA: Diagnosis not present

## 2024-11-20 DIAGNOSIS — S92511D Displaced fracture of proximal phalanx of right lesser toe(s), subsequent encounter for fracture with routine healing: Secondary | ICD-10-CM | POA: Diagnosis not present

## 2024-11-20 DIAGNOSIS — M25531 Pain in right wrist: Secondary | ICD-10-CM | POA: Diagnosis not present

## 2024-11-20 DIAGNOSIS — M25562 Pain in left knee: Secondary | ICD-10-CM | POA: Diagnosis not present

## 2024-11-20 DIAGNOSIS — S82832D Other fracture of upper and lower end of left fibula, subsequent encounter for closed fracture with routine healing: Secondary | ICD-10-CM | POA: Diagnosis not present

## 2024-11-21 ENCOUNTER — Ambulatory Visit: Admitting: Physical Therapy

## 2024-11-21 ENCOUNTER — Ambulatory Visit: Admitting: Occupational Therapy

## 2024-11-21 DIAGNOSIS — R2689 Other abnormalities of gait and mobility: Secondary | ICD-10-CM

## 2024-11-21 DIAGNOSIS — M6281 Muscle weakness (generalized): Secondary | ICD-10-CM

## 2024-11-21 DIAGNOSIS — R262 Difficulty in walking, not elsewhere classified: Secondary | ICD-10-CM

## 2024-11-21 DIAGNOSIS — R269 Unspecified abnormalities of gait and mobility: Secondary | ICD-10-CM

## 2024-11-21 DIAGNOSIS — R2681 Unsteadiness on feet: Secondary | ICD-10-CM
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
## 2024-11-26 ENCOUNTER — Ambulatory Visit: Admitting: Physical Therapy

## 2024-11-26 ENCOUNTER — Ambulatory Visit

## 2024-11-26 DIAGNOSIS — R269 Unspecified abnormalities of gait and mobility: Secondary | ICD-10-CM

## 2024-11-26 DIAGNOSIS — M6281 Muscle weakness (generalized): Secondary | ICD-10-CM | POA: Diagnosis not present

## 2024-11-26 DIAGNOSIS — R262 Difficulty in walking, not elsewhere classified: Secondary | ICD-10-CM

## 2024-11-26 DIAGNOSIS — R278 Other lack of coordination: Secondary | ICD-10-CM

## 2024-11-26 DIAGNOSIS — R2689 Other abnormalities of gait and mobility: Secondary | ICD-10-CM

## 2024-11-26 DIAGNOSIS — R2681 Unsteadiness on feet: Secondary | ICD-10-CM
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
## 2024-11-27 ENCOUNTER — Ambulatory Visit: Admitting: Occupational Therapy

## 2024-11-27 DIAGNOSIS — M6281 Muscle weakness (generalized): Secondary | ICD-10-CM
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
## 2024-11-28 ENCOUNTER — Ambulatory Visit: Admitting: Occupational Therapy

## 2024-11-28 ENCOUNTER — Ambulatory Visit: Admitting: Physical Therapy

## 2024-11-28 DIAGNOSIS — M6281 Muscle weakness (generalized): Secondary | ICD-10-CM

## 2024-11-28 DIAGNOSIS — R2681 Unsteadiness on feet: Secondary | ICD-10-CM

## 2024-11-28 DIAGNOSIS — R262 Difficulty in walking, not elsewhere classified: Secondary | ICD-10-CM

## 2024-11-28 DIAGNOSIS — R2689 Other abnormalities of gait and mobility: Secondary | ICD-10-CM

## 2024-11-28 DIAGNOSIS — R269 Unspecified abnormalities of gait and mobility: Secondary | ICD-10-CM
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
## 2024-12-03 ENCOUNTER — Ambulatory Visit: Admitting: Occupational Therapy

## 2024-12-03 ENCOUNTER — Ambulatory Visit: Admitting: Physical Therapy

## 2024-12-03 ENCOUNTER — Other Ambulatory Visit: Payer: Self-pay

## 2024-12-03 DIAGNOSIS — M6281 Muscle weakness (generalized): Secondary | ICD-10-CM | POA: Diagnosis not present

## 2024-12-03 DIAGNOSIS — R269 Unspecified abnormalities of gait and mobility: Secondary | ICD-10-CM

## 2024-12-03 DIAGNOSIS — R2681 Unsteadiness on feet: Secondary | ICD-10-CM

## 2024-12-03 DIAGNOSIS — R262 Difficulty in walking, not elsewhere classified: Secondary | ICD-10-CM

## 2024-12-03 DIAGNOSIS — R2689 Other abnormalities of gait and mobility: Secondary | ICD-10-CM
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
## 2024-12-04 ENCOUNTER — Other Ambulatory Visit (HOSPITAL_COMMUNITY): Payer: Self-pay

## 2024-12-04 ENCOUNTER — Other Ambulatory Visit: Payer: Self-pay

## 2024-12-04 MED ORDER — ELFOLATE PLUS 3-35-2 MG PO TABS
1.0000 | ORAL_TABLET | Freq: Every day | ORAL | 3 refills | Status: AC
  Filled 2024-12-04: qty 90, 90d supply, fill #0

## 2024-12-08 ENCOUNTER — Other Ambulatory Visit: Payer: Self-pay | Admitting: Hematology and Oncology

## 2024-12-10 ENCOUNTER — Ambulatory Visit: Admitting: Physical Therapy

## 2024-12-10 ENCOUNTER — Ambulatory Visit: Admitting: Occupational Therapy

## 2024-12-10 DIAGNOSIS — M6281 Muscle weakness (generalized): Secondary | ICD-10-CM | POA: Diagnosis not present

## 2024-12-10 DIAGNOSIS — R2689 Other abnormalities of gait and mobility: Secondary | ICD-10-CM

## 2024-12-10 DIAGNOSIS — R262 Difficulty in walking, not elsewhere classified: Secondary | ICD-10-CM

## 2024-12-10 DIAGNOSIS — R269 Unspecified abnormalities of gait and mobility: Secondary | ICD-10-CM

## 2024-12-10 DIAGNOSIS — R2681 Unsteadiness on feet: Secondary | ICD-10-CM
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
## 2024-12-17 ENCOUNTER — Ambulatory Visit: Admitting: Occupational Therapy

## 2024-12-17 ENCOUNTER — Ambulatory Visit: Admitting: Physical Therapy
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
## 2024-12-18 ENCOUNTER — Ambulatory Visit: Attending: Physical Medicine and Rehabilitation | Admitting: Occupational Therapy

## 2024-12-18 DIAGNOSIS — R262 Difficulty in walking, not elsewhere classified: Secondary | ICD-10-CM | POA: Insufficient documentation

## 2024-12-18 DIAGNOSIS — M6281 Muscle weakness (generalized): Secondary | ICD-10-CM | POA: Insufficient documentation

## 2024-12-18 DIAGNOSIS — R2681 Unsteadiness on feet: Secondary | ICD-10-CM | POA: Insufficient documentation

## 2024-12-18 DIAGNOSIS — R269 Unspecified abnormalities of gait and mobility: Secondary | ICD-10-CM | POA: Insufficient documentation

## 2024-12-18 DIAGNOSIS — R278 Other lack of coordination: Secondary | ICD-10-CM | POA: Insufficient documentation

## 2024-12-18 DIAGNOSIS — R2689 Other abnormalities of gait and mobility: Secondary | ICD-10-CM | POA: Insufficient documentation
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
## 2024-12-19 ENCOUNTER — Ambulatory Visit: Admitting: Physical Therapy

## 2024-12-19 ENCOUNTER — Ambulatory Visit: Admitting: Occupational Therapy

## 2024-12-19 DIAGNOSIS — M6281 Muscle weakness (generalized): Secondary | ICD-10-CM

## 2024-12-19 DIAGNOSIS — R2689 Other abnormalities of gait and mobility: Secondary | ICD-10-CM

## 2024-12-19 DIAGNOSIS — R269 Unspecified abnormalities of gait and mobility: Secondary | ICD-10-CM

## 2024-12-19 DIAGNOSIS — R2681 Unsteadiness on feet: Secondary | ICD-10-CM

## 2024-12-19 DIAGNOSIS — R262 Difficulty in walking, not elsewhere classified: Secondary | ICD-10-CM
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
## 2024-12-24 ENCOUNTER — Ambulatory Visit: Admitting: Occupational Therapy

## 2024-12-24 ENCOUNTER — Ambulatory Visit: Admitting: Physical Therapy

## 2024-12-24 DIAGNOSIS — R262 Difficulty in walking, not elsewhere classified: Secondary | ICD-10-CM

## 2024-12-24 DIAGNOSIS — R2681 Unsteadiness on feet: Secondary | ICD-10-CM

## 2024-12-24 DIAGNOSIS — R2689 Other abnormalities of gait and mobility: Secondary | ICD-10-CM

## 2024-12-24 DIAGNOSIS — R269 Unspecified abnormalities of gait and mobility: Secondary | ICD-10-CM

## 2024-12-24 DIAGNOSIS — M6281 Muscle weakness (generalized): Secondary | ICD-10-CM
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
## 2024-12-25 ENCOUNTER — Ambulatory Visit: Admitting: Occupational Therapy

## 2024-12-25 DIAGNOSIS — M6281 Muscle weakness (generalized): Secondary | ICD-10-CM
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
## 2024-12-26 ENCOUNTER — Ambulatory Visit

## 2024-12-26 ENCOUNTER — Ambulatory Visit: Admitting: Physical Therapy

## 2024-12-26 DIAGNOSIS — R278 Other lack of coordination: Secondary | ICD-10-CM

## 2024-12-26 DIAGNOSIS — M6281 Muscle weakness (generalized): Secondary | ICD-10-CM

## 2024-12-26 DIAGNOSIS — R2681 Unsteadiness on feet: Secondary | ICD-10-CM

## 2024-12-26 DIAGNOSIS — R262 Difficulty in walking, not elsewhere classified: Secondary | ICD-10-CM

## 2024-12-26 DIAGNOSIS — R2689 Other abnormalities of gait and mobility: Secondary | ICD-10-CM

## 2024-12-26 DIAGNOSIS — R269 Unspecified abnormalities of gait and mobility: Secondary | ICD-10-CM
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
## 2024-12-31 ENCOUNTER — Ambulatory Visit: Admitting: Physical Therapy

## 2024-12-31 ENCOUNTER — Ambulatory Visit: Admitting: Occupational Therapy

## 2024-12-31 DIAGNOSIS — M6281 Muscle weakness (generalized): Secondary | ICD-10-CM

## 2024-12-31 DIAGNOSIS — R2689 Other abnormalities of gait and mobility: Secondary | ICD-10-CM

## 2024-12-31 DIAGNOSIS — R269 Unspecified abnormalities of gait and mobility: Secondary | ICD-10-CM

## 2024-12-31 DIAGNOSIS — R262 Difficulty in walking, not elsewhere classified: Secondary | ICD-10-CM

## 2024-12-31 DIAGNOSIS — R2681 Unsteadiness on feet: Secondary | ICD-10-CM
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
## 2025-01-01 ENCOUNTER — Ambulatory Visit: Admitting: Occupational Therapy

## 2025-01-01 ENCOUNTER — Encounter: Payer: Self-pay | Admitting: *Deleted

## 2025-01-01 DIAGNOSIS — M6281 Muscle weakness (generalized): Secondary | ICD-10-CM
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
## 2025-01-02 ENCOUNTER — Ambulatory Visit: Admitting: Emergency Medicine

## 2025-01-02 ENCOUNTER — Encounter: Payer: Self-pay | Admitting: Emergency Medicine

## 2025-01-02 VITALS — BP 130/56 | HR 70 | Ht 63.5 in | Wt 132.0 lb

## 2025-01-02 DIAGNOSIS — Z79899 Other long term (current) drug therapy: Secondary | ICD-10-CM | POA: Diagnosis not present

## 2025-01-02 DIAGNOSIS — I4719 Other supraventricular tachycardia: Secondary | ICD-10-CM | POA: Diagnosis not present

## 2025-01-02 DIAGNOSIS — I10 Essential (primary) hypertension: Secondary | ICD-10-CM

## 2025-01-02 DIAGNOSIS — R000 Tachycardia, unspecified: Secondary | ICD-10-CM | POA: Diagnosis not present

## 2025-01-02 NOTE — Patient Instructions (Addendum)
 Medication Instructions:  NO CHANGES  Lab Work: BMET, LFTs, AND TSH TO BE DONE TODAY.  Testing/Procedures: NONE  Follow-Up: At [facility], you and your health needs are our priority.  As part of our continuing mission to provide you with exceptional heart care, our providers are all part of one team.  This team includes your primary Cardiologist (physician) and Advanced Practice Providers or APPs (Physician Assistants and Nurse Practitioners) who all work together to provide you with the care you need, when you need it.  Your next appointment:   3 MONTHS WITH RENEE URSUY IN EP  Provider:   CHARLIES ARTHUR, PA-C   Other Instructions:
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
## 2025-01-03 LAB — HEPATIC FUNCTION PANEL
ALT: 13 IU/L (ref 0–32)
AST: 21 IU/L (ref 0–40)
Albumin: 4.3 g/dL (ref 3.6–4.6)
Alkaline Phosphatase: 101 IU/L (ref 48–129)
Bilirubin Total: 0.3 mg/dL (ref 0.0–1.2)
Bilirubin, Direct: 0.13 mg/dL (ref 0.00–0.40)
Total Protein: 6.7 g/dL (ref 6.0–8.5)

## 2025-01-03 LAB — BASIC METABOLIC PANEL WITH GFR
BUN/Creatinine Ratio: 33 — ABNORMAL HIGH (ref 12–28)
BUN: 26 mg/dL (ref 10–36)
CO2: 24 mmol/L (ref 20–29)
Calcium: 10 mg/dL (ref 8.7–10.3)
Chloride: 100 mmol/L (ref 96–106)
Creatinine, Ser: 0.8 mg/dL (ref 0.57–1.00)
Glucose: 91 mg/dL (ref 70–99)
Potassium: 4.7 mmol/L (ref 3.5–5.2)
Sodium: 137 mmol/L (ref 134–144)
eGFR: 70 mL/min/1.73

## 2025-01-03 LAB — TSH: TSH: 14.8 u[IU]/mL — ABNORMAL HIGH (ref 0.450–4.500)

## 2025-01-07 ENCOUNTER — Ambulatory Visit: Payer: Self-pay | Admitting: Emergency Medicine

## 2025-01-07 DIAGNOSIS — I4719 Other supraventricular tachycardia: Secondary | ICD-10-CM

## 2025-01-07 DIAGNOSIS — I499 Cardiac arrhythmia, unspecified: Secondary | ICD-10-CM

## 2025-01-07 DIAGNOSIS — Z79899 Other long term (current) drug therapy: Secondary | ICD-10-CM

## 2025-01-08 ENCOUNTER — Ambulatory Visit: Attending: Emergency Medicine

## 2025-01-08 ENCOUNTER — Ambulatory Visit: Admitting: Occupational Therapy

## 2025-01-08 DIAGNOSIS — I4719 Other supraventricular tachycardia: Secondary | ICD-10-CM

## 2025-01-08 DIAGNOSIS — I499 Cardiac arrhythmia, unspecified: Secondary | ICD-10-CM

## 2025-01-08 NOTE — Progress Notes (Unsigned)
 Enrolled patient for a 14 day Zio XT monitor to be mailed to patients home  [place] to read

## 2025-01-09 ENCOUNTER — Ambulatory Visit: Admitting: Physical Therapy

## 2025-01-14 ENCOUNTER — Ambulatory Visit: Admitting: Physical Therapy

## 2025-01-14 ENCOUNTER — Ambulatory Visit: Admitting: Occupational Therapy

## 2025-01-14 DIAGNOSIS — R2681 Unsteadiness on feet: Secondary | ICD-10-CM

## 2025-01-14 DIAGNOSIS — R2689 Other abnormalities of gait and mobility: Secondary | ICD-10-CM

## 2025-01-14 DIAGNOSIS — R269 Unspecified abnormalities of gait and mobility: Secondary | ICD-10-CM

## 2025-01-14 DIAGNOSIS — R262 Difficulty in walking, not elsewhere classified: Secondary | ICD-10-CM

## 2025-01-14 DIAGNOSIS — M6281 Muscle weakness (generalized): Secondary | ICD-10-CM

## 2025-01-15 ENCOUNTER — Ambulatory Visit: Admitting: Occupational Therapy

## 2025-01-15 DIAGNOSIS — M6281 Muscle weakness (generalized): Secondary | ICD-10-CM
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
## 2025-01-16 ENCOUNTER — Ambulatory Visit: Admitting: Occupational Therapy

## 2025-01-16 ENCOUNTER — Ambulatory Visit: Admitting: Physical Therapy

## 2025-01-16 DIAGNOSIS — R2689 Other abnormalities of gait and mobility: Secondary | ICD-10-CM

## 2025-01-16 DIAGNOSIS — R262 Difficulty in walking, not elsewhere classified: Secondary | ICD-10-CM

## 2025-01-16 DIAGNOSIS — R2681 Unsteadiness on feet: Secondary | ICD-10-CM

## 2025-01-16 DIAGNOSIS — M6281 Muscle weakness (generalized): Secondary | ICD-10-CM

## 2025-01-16 DIAGNOSIS — R269 Unspecified abnormalities of gait and mobility: Secondary | ICD-10-CM
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
## 2025-01-21 ENCOUNTER — Ambulatory Visit: Admitting: Occupational Therapy

## 2025-01-21 ENCOUNTER — Ambulatory Visit: Admitting: Physical Therapy

## 2025-01-22 ENCOUNTER — Ambulatory Visit: Admitting: Occupational Therapy

## 2025-01-23 ENCOUNTER — Ambulatory Visit: Admitting: Physical Therapy

## 2025-01-23 ENCOUNTER — Ambulatory Visit: Admitting: Occupational Therapy

## 2025-01-28 ENCOUNTER — Ambulatory Visit: Admitting: Occupational Therapy

## 2025-01-28 ENCOUNTER — Ambulatory Visit: Admitting: Physical Therapy

## 2025-01-29 ENCOUNTER — Ambulatory Visit: Admitting: Occupational Therapy

## 2025-01-30 ENCOUNTER — Ambulatory Visit: Admitting: Physical Therapy

## 2025-01-30 ENCOUNTER — Ambulatory Visit: Admitting: Occupational Therapy

## 2025-02-04 ENCOUNTER — Ambulatory Visit: Admitting: Occupational Therapy

## 2025-02-04 ENCOUNTER — Ambulatory Visit: Admitting: Physical Therapy

## 2025-02-05 ENCOUNTER — Ambulatory Visit: Admitting: Occupational Therapy

## 2025-02-06 ENCOUNTER — Ambulatory Visit: Admitting: Occupational Therapy

## 2025-02-11 ENCOUNTER — Ambulatory Visit: Admitting: Occupational Therapy

## 2025-02-11 ENCOUNTER — Ambulatory Visit: Admitting: Physical Therapy

## 2025-02-12 ENCOUNTER — Ambulatory Visit: Admitting: Occupational Therapy

## 2025-02-12 ENCOUNTER — Inpatient Hospital Stay: Admitting: Hematology and Oncology

## 2025-02-13 ENCOUNTER — Ambulatory Visit: Admitting: Physical Therapy

## 2025-02-13 ENCOUNTER — Ambulatory Visit: Admitting: Occupational Therapy

## 2025-02-18 ENCOUNTER — Ambulatory Visit: Admitting: Occupational Therapy

## 2025-02-18 ENCOUNTER — Ambulatory Visit: Admitting: Physical Therapy

## 2025-02-19 ENCOUNTER — Ambulatory Visit: Admitting: Occupational Therapy

## 2025-02-20 ENCOUNTER — Ambulatory Visit: Admitting: Occupational Therapy

## 2025-02-20 ENCOUNTER — Ambulatory Visit: Admitting: Physical Therapy

## 2025-02-25 ENCOUNTER — Ambulatory Visit: Admitting: Occupational Therapy

## 2025-02-25 ENCOUNTER — Ambulatory Visit: Admitting: Physical Therapy

## 2025-02-26 ENCOUNTER — Ambulatory Visit: Admitting: Occupational Therapy

## 2025-02-27 ENCOUNTER — Ambulatory Visit: Admitting: Physical Therapy

## 2025-02-27 ENCOUNTER — Ambulatory Visit: Admitting: Occupational Therapy

## 2025-03-04 ENCOUNTER — Ambulatory Visit: Admitting: Physical Therapy

## 2025-03-04 ENCOUNTER — Ambulatory Visit: Admitting: Occupational Therapy

## 2025-03-05 ENCOUNTER — Ambulatory Visit: Admitting: Occupational Therapy

## 2025-03-06 ENCOUNTER — Ambulatory Visit: Admitting: Occupational Therapy

## 2025-03-06 ENCOUNTER — Ambulatory Visit: Admitting: Physical Therapy

## 2025-03-11 ENCOUNTER — Ambulatory Visit: Admitting: Physical Therapy

## 2025-03-11 ENCOUNTER — Ambulatory Visit: Admitting: Occupational Therapy

## 2025-03-12 ENCOUNTER — Ambulatory Visit: Admitting: Occupational Therapy

## 2025-03-13 ENCOUNTER — Ambulatory Visit: Admitting: Occupational Therapy

## 2025-03-13 ENCOUNTER — Ambulatory Visit: Admitting: Physical Therapy

## 2025-03-18 ENCOUNTER — Ambulatory Visit: Admitting: Occupational Therapy

## 2025-03-18 ENCOUNTER — Ambulatory Visit: Admitting: Physical Therapy

## 2025-03-19 ENCOUNTER — Ambulatory Visit: Admitting: Occupational Therapy

## 2025-03-20 ENCOUNTER — Ambulatory Visit: Admitting: Occupational Therapy

## 2025-03-20 ENCOUNTER — Ambulatory Visit: Admitting: Physical Therapy

## 2025-03-25 ENCOUNTER — Ambulatory Visit: Admitting: Occupational Therapy

## 2025-03-25 ENCOUNTER — Ambulatory Visit: Admitting: Physical Therapy

## 2025-03-26 ENCOUNTER — Ambulatory Visit: Admitting: Occupational Therapy

## 2025-03-27 ENCOUNTER — Ambulatory Visit: Admitting: Physical Therapy

## 2025-03-27 ENCOUNTER — Ambulatory Visit: Admitting: Occupational Therapy

## 2025-04-01 ENCOUNTER — Ambulatory Visit: Admitting: Physical Therapy

## 2025-04-01 ENCOUNTER — Ambulatory Visit: Admitting: Occupational Therapy

## 2025-04-02 ENCOUNTER — Ambulatory Visit: Admitting: Occupational Therapy

## 2025-04-03 ENCOUNTER — Ambulatory Visit: Admitting: Occupational Therapy

## 2025-04-03 ENCOUNTER — Ambulatory Visit: Admitting: Physical Therapy

## 2025-04-08 ENCOUNTER — Ambulatory Visit: Admitting: Physical Therapy

## 2025-04-08 ENCOUNTER — Ambulatory Visit: Admitting: Occupational Therapy

## 2025-04-09 ENCOUNTER — Ambulatory Visit: Admitting: Occupational Therapy

## 2025-04-10 ENCOUNTER — Ambulatory Visit: Admitting: Physical Therapy

## 2025-04-10 ENCOUNTER — Ambulatory Visit: Admitting: Occupational Therapy

## 2025-04-15 ENCOUNTER — Ambulatory Visit: Admitting: Occupational Therapy

## 2025-04-15 ENCOUNTER — Ambulatory Visit: Admitting: Physical Therapy

## 2025-04-15 ENCOUNTER — Ambulatory Visit: Admitting: Physician Assistant

## 2025-04-16 ENCOUNTER — Ambulatory Visit: Admitting: Occupational Therapy

## 2025-04-17 ENCOUNTER — Ambulatory Visit: Admitting: Physical Therapy

## 2025-04-17 ENCOUNTER — Ambulatory Visit: Admitting: Occupational Therapy

## 2025-04-22 ENCOUNTER — Ambulatory Visit: Admitting: Physical Therapy

## 2025-04-22 ENCOUNTER — Ambulatory Visit: Admitting: Occupational Therapy

## 2025-04-23 ENCOUNTER — Ambulatory Visit: Admitting: Occupational Therapy

## 2025-04-24 ENCOUNTER — Ambulatory Visit: Admitting: Physical Therapy

## 2025-04-24 ENCOUNTER — Ambulatory Visit: Admitting: Occupational Therapy

## 2025-04-29 ENCOUNTER — Ambulatory Visit: Admitting: Occupational Therapy

## 2025-04-29 ENCOUNTER — Ambulatory Visit: Admitting: Physical Therapy

## 2025-04-30 ENCOUNTER — Ambulatory Visit: Admitting: Occupational Therapy

## 2025-05-01 ENCOUNTER — Ambulatory Visit: Admitting: Occupational Therapy

## 2025-05-01 ENCOUNTER — Ambulatory Visit: Admitting: Physical Therapy

## 2025-05-06 ENCOUNTER — Ambulatory Visit: Admitting: Occupational Therapy

## 2025-05-06 ENCOUNTER — Ambulatory Visit: Admitting: Physical Therapy

## 2025-05-07 ENCOUNTER — Ambulatory Visit: Admitting: Occupational Therapy

## 2025-05-14 ENCOUNTER — Ambulatory Visit: Admitting: Occupational Therapy

## 2025-05-21 ENCOUNTER — Ambulatory Visit: Admitting: Occupational Therapy

## 2025-05-28 ENCOUNTER — Ambulatory Visit: Admitting: Occupational Therapy

## 2025-06-04 ENCOUNTER — Ambulatory Visit: Admitting: Occupational Therapy
